# Patient Record
Sex: Male | Born: 1950
Health system: Southern US, Community
[De-identification: ages and names within clinical notes are randomized; demographics above are authoritative.]

## PROBLEM LIST (undated history)

## (undated) DIAGNOSIS — I10 Essential (primary) hypertension: Secondary | ICD-10-CM

## (undated) DIAGNOSIS — E785 Hyperlipidemia, unspecified: Secondary | ICD-10-CM

## (undated) DIAGNOSIS — F0280 Dementia in other diseases classified elsewhere without behavioral disturbance: Secondary | ICD-10-CM

## (undated) DIAGNOSIS — M1990 Unspecified osteoarthritis, unspecified site: Secondary | ICD-10-CM

## (undated) DIAGNOSIS — G1221 Amyotrophic lateral sclerosis: Secondary | ICD-10-CM

## (undated) DIAGNOSIS — I213 ST elevation (STEMI) myocardial infarction of unspecified site: Secondary | ICD-10-CM

## (undated) DIAGNOSIS — K5090 Crohn's disease, unspecified, without complications: Secondary | ICD-10-CM

## (undated) HISTORY — PX: LEG SURGERY: SHX1003

---
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
## 2002-02-10 ENCOUNTER — Emergency Department (HOSPITAL_COMMUNITY): Admission: EM | Admit: 2002-02-10 | Discharge: 2002-02-10 | Payer: Self-pay | Admitting: Emergency Medicine

## 2008-03-23 DIAGNOSIS — I213 ST elevation (STEMI) myocardial infarction of unspecified site: Secondary | ICD-10-CM

## 2008-03-23 HISTORY — DX: ST elevation (STEMI) myocardial infarction of unspecified site: I21.3

## 2016-07-21 DIAGNOSIS — F3341 Major depressive disorder, recurrent, in partial remission: Secondary | ICD-10-CM | POA: Diagnosis not present

## 2016-09-03 DIAGNOSIS — I10 Essential (primary) hypertension: Secondary | ICD-10-CM | POA: Diagnosis not present

## 2016-09-03 DIAGNOSIS — H2513 Age-related nuclear cataract, bilateral: Secondary | ICD-10-CM | POA: Diagnosis not present

## 2016-09-03 DIAGNOSIS — H25013 Cortical age-related cataract, bilateral: Secondary | ICD-10-CM | POA: Diagnosis not present

## 2016-09-03 DIAGNOSIS — H25043 Posterior subcapsular polar age-related cataract, bilateral: Secondary | ICD-10-CM | POA: Diagnosis not present

## 2016-09-03 DIAGNOSIS — H2511 Age-related nuclear cataract, right eye: Secondary | ICD-10-CM | POA: Diagnosis not present

## 2016-10-28 DIAGNOSIS — I252 Old myocardial infarction: Secondary | ICD-10-CM | POA: Diagnosis not present

## 2016-10-28 DIAGNOSIS — F330 Major depressive disorder, recurrent, mild: Secondary | ICD-10-CM | POA: Diagnosis not present

## 2016-10-28 DIAGNOSIS — E785 Hyperlipidemia, unspecified: Secondary | ICD-10-CM | POA: Diagnosis not present

## 2016-10-28 DIAGNOSIS — K5090 Crohn's disease, unspecified, without complications: Secondary | ICD-10-CM | POA: Diagnosis not present

## 2016-10-28 DIAGNOSIS — E669 Obesity, unspecified: Secondary | ICD-10-CM | POA: Diagnosis not present

## 2016-10-28 DIAGNOSIS — I10 Essential (primary) hypertension: Secondary | ICD-10-CM | POA: Diagnosis not present

## 2016-10-28 DIAGNOSIS — G4700 Insomnia, unspecified: Secondary | ICD-10-CM | POA: Diagnosis not present

## 2016-10-28 DIAGNOSIS — F418 Other specified anxiety disorders: Secondary | ICD-10-CM | POA: Diagnosis not present

## 2016-10-28 DIAGNOSIS — G250 Essential tremor: Secondary | ICD-10-CM | POA: Diagnosis not present

## 2016-11-03 DIAGNOSIS — Z131 Encounter for screening for diabetes mellitus: Secondary | ICD-10-CM | POA: Diagnosis not present

## 2016-11-03 DIAGNOSIS — Z6827 Body mass index (BMI) 27.0-27.9, adult: Secondary | ICD-10-CM | POA: Diagnosis not present

## 2016-11-03 DIAGNOSIS — R234 Changes in skin texture: Secondary | ICD-10-CM | POA: Diagnosis not present

## 2016-11-11 DIAGNOSIS — H2511 Age-related nuclear cataract, right eye: Secondary | ICD-10-CM | POA: Diagnosis not present

## 2016-11-11 DIAGNOSIS — I10 Essential (primary) hypertension: Secondary | ICD-10-CM | POA: Diagnosis not present

## 2016-11-11 DIAGNOSIS — Z87891 Personal history of nicotine dependence: Secondary | ICD-10-CM | POA: Diagnosis not present

## 2016-11-11 DIAGNOSIS — H2513 Age-related nuclear cataract, bilateral: Secondary | ICD-10-CM | POA: Diagnosis not present

## 2016-11-11 DIAGNOSIS — H25043 Posterior subcapsular polar age-related cataract, bilateral: Secondary | ICD-10-CM | POA: Diagnosis not present

## 2016-11-11 DIAGNOSIS — I2510 Atherosclerotic heart disease of native coronary artery without angina pectoris: Secondary | ICD-10-CM | POA: Diagnosis not present

## 2016-11-11 DIAGNOSIS — K219 Gastro-esophageal reflux disease without esophagitis: Secondary | ICD-10-CM | POA: Diagnosis not present

## 2016-11-11 DIAGNOSIS — E785 Hyperlipidemia, unspecified: Secondary | ICD-10-CM | POA: Diagnosis not present

## 2016-11-11 DIAGNOSIS — E7800 Pure hypercholesterolemia, unspecified: Secondary | ICD-10-CM | POA: Diagnosis not present

## 2016-11-11 DIAGNOSIS — H25013 Cortical age-related cataract, bilateral: Secondary | ICD-10-CM | POA: Diagnosis not present

## 2016-11-12 DIAGNOSIS — H2512 Age-related nuclear cataract, left eye: Secondary | ICD-10-CM | POA: Diagnosis not present

## 2016-11-24 DIAGNOSIS — F3341 Major depressive disorder, recurrent, in partial remission: Secondary | ICD-10-CM | POA: Diagnosis not present

## 2016-12-02 DIAGNOSIS — Z87891 Personal history of nicotine dependence: Secondary | ICD-10-CM | POA: Diagnosis not present

## 2016-12-02 DIAGNOSIS — K219 Gastro-esophageal reflux disease without esophagitis: Secondary | ICD-10-CM | POA: Diagnosis not present

## 2016-12-02 DIAGNOSIS — Z886 Allergy status to analgesic agent status: Secondary | ICD-10-CM | POA: Diagnosis not present

## 2016-12-02 DIAGNOSIS — K5090 Crohn's disease, unspecified, without complications: Secondary | ICD-10-CM | POA: Diagnosis not present

## 2016-12-02 DIAGNOSIS — Z955 Presence of coronary angioplasty implant and graft: Secondary | ICD-10-CM | POA: Diagnosis not present

## 2016-12-02 DIAGNOSIS — Z8679 Personal history of other diseases of the circulatory system: Secondary | ICD-10-CM | POA: Diagnosis not present

## 2016-12-02 DIAGNOSIS — H2512 Age-related nuclear cataract, left eye: Secondary | ICD-10-CM | POA: Diagnosis not present

## 2016-12-02 DIAGNOSIS — I10 Essential (primary) hypertension: Secondary | ICD-10-CM | POA: Diagnosis not present

## 2016-12-02 DIAGNOSIS — E785 Hyperlipidemia, unspecified: Secondary | ICD-10-CM | POA: Diagnosis not present

## 2016-12-22 DIAGNOSIS — Z87891 Personal history of nicotine dependence: Secondary | ICD-10-CM | POA: Diagnosis not present

## 2016-12-22 DIAGNOSIS — I10 Essential (primary) hypertension: Secondary | ICD-10-CM | POA: Diagnosis not present

## 2016-12-22 DIAGNOSIS — E7800 Pure hypercholesterolemia, unspecified: Secondary | ICD-10-CM | POA: Diagnosis not present

## 2016-12-22 DIAGNOSIS — Z7982 Long term (current) use of aspirin: Secondary | ICD-10-CM | POA: Diagnosis not present

## 2016-12-22 DIAGNOSIS — I2510 Atherosclerotic heart disease of native coronary artery without angina pectoris: Secondary | ICD-10-CM | POA: Diagnosis not present

## 2017-01-07 DIAGNOSIS — R197 Diarrhea, unspecified: Secondary | ICD-10-CM | POA: Diagnosis not present

## 2017-01-07 DIAGNOSIS — K5000 Crohn's disease of small intestine without complications: Secondary | ICD-10-CM | POA: Diagnosis not present

## 2017-01-11 DIAGNOSIS — K5000 Crohn's disease of small intestine without complications: Secondary | ICD-10-CM | POA: Diagnosis not present

## 2017-01-20 DIAGNOSIS — K5000 Crohn's disease of small intestine without complications: Secondary | ICD-10-CM | POA: Diagnosis not present

## 2017-01-20 DIAGNOSIS — R109 Unspecified abdominal pain: Secondary | ICD-10-CM | POA: Diagnosis not present

## 2017-01-21 DIAGNOSIS — R197 Diarrhea, unspecified: Secondary | ICD-10-CM | POA: Diagnosis not present

## 2017-01-21 DIAGNOSIS — K5000 Crohn's disease of small intestine without complications: Secondary | ICD-10-CM | POA: Diagnosis not present

## 2017-02-22 DIAGNOSIS — F3341 Major depressive disorder, recurrent, in partial remission: Secondary | ICD-10-CM | POA: Diagnosis not present

## 2017-02-23 DIAGNOSIS — J40 Bronchitis, not specified as acute or chronic: Secondary | ICD-10-CM | POA: Diagnosis not present

## 2017-02-23 DIAGNOSIS — Z1331 Encounter for screening for depression: Secondary | ICD-10-CM | POA: Diagnosis not present

## 2017-02-23 DIAGNOSIS — Z6827 Body mass index (BMI) 27.0-27.9, adult: Secondary | ICD-10-CM | POA: Diagnosis not present

## 2017-05-21 DIAGNOSIS — G250 Essential tremor: Secondary | ICD-10-CM | POA: Diagnosis not present

## 2017-07-01 DIAGNOSIS — I2510 Atherosclerotic heart disease of native coronary artery without angina pectoris: Secondary | ICD-10-CM | POA: Diagnosis not present

## 2017-07-01 DIAGNOSIS — E782 Mixed hyperlipidemia: Secondary | ICD-10-CM | POA: Diagnosis not present

## 2017-07-01 DIAGNOSIS — Z7982 Long term (current) use of aspirin: Secondary | ICD-10-CM | POA: Diagnosis not present

## 2017-07-01 DIAGNOSIS — Z87891 Personal history of nicotine dependence: Secondary | ICD-10-CM | POA: Diagnosis not present

## 2017-07-01 DIAGNOSIS — I10 Essential (primary) hypertension: Secondary | ICD-10-CM | POA: Diagnosis not present

## 2017-08-13 DIAGNOSIS — I10 Essential (primary) hypertension: Secondary | ICD-10-CM | POA: Diagnosis not present

## 2017-08-13 DIAGNOSIS — Z79899 Other long term (current) drug therapy: Secondary | ICD-10-CM | POA: Diagnosis not present

## 2017-08-13 DIAGNOSIS — Z125 Encounter for screening for malignant neoplasm of prostate: Secondary | ICD-10-CM | POA: Diagnosis not present

## 2017-08-13 DIAGNOSIS — F419 Anxiety disorder, unspecified: Secondary | ICD-10-CM | POA: Diagnosis not present

## 2017-08-13 DIAGNOSIS — Z23 Encounter for immunization: Secondary | ICD-10-CM | POA: Diagnosis not present

## 2017-08-13 DIAGNOSIS — E785 Hyperlipidemia, unspecified: Secondary | ICD-10-CM | POA: Diagnosis not present

## 2017-08-13 DIAGNOSIS — F329 Major depressive disorder, single episode, unspecified: Secondary | ICD-10-CM | POA: Diagnosis not present

## 2017-08-13 DIAGNOSIS — Z1159 Encounter for screening for other viral diseases: Secondary | ICD-10-CM | POA: Diagnosis not present

## 2017-08-13 DIAGNOSIS — M150 Primary generalized (osteo)arthritis: Secondary | ICD-10-CM | POA: Diagnosis not present

## 2017-10-12 DIAGNOSIS — F332 Major depressive disorder, recurrent severe without psychotic features: Secondary | ICD-10-CM | POA: Diagnosis not present

## 2017-12-23 DIAGNOSIS — Z6827 Body mass index (BMI) 27.0-27.9, adult: Secondary | ICD-10-CM | POA: Diagnosis not present

## 2017-12-23 DIAGNOSIS — M79641 Pain in right hand: Secondary | ICD-10-CM | POA: Diagnosis not present

## 2017-12-23 DIAGNOSIS — M150 Primary generalized (osteo)arthritis: Secondary | ICD-10-CM | POA: Diagnosis not present

## 2017-12-23 DIAGNOSIS — S6991XA Unspecified injury of right wrist, hand and finger(s), initial encounter: Secondary | ICD-10-CM | POA: Diagnosis not present

## 2018-01-11 DIAGNOSIS — I10 Essential (primary) hypertension: Secondary | ICD-10-CM | POA: Diagnosis not present

## 2018-01-11 DIAGNOSIS — Z7982 Long term (current) use of aspirin: Secondary | ICD-10-CM | POA: Diagnosis not present

## 2018-01-11 DIAGNOSIS — I2510 Atherosclerotic heart disease of native coronary artery without angina pectoris: Secondary | ICD-10-CM | POA: Diagnosis not present

## 2018-01-11 DIAGNOSIS — E782 Mixed hyperlipidemia: Secondary | ICD-10-CM | POA: Diagnosis not present

## 2018-01-11 DIAGNOSIS — Z87891 Personal history of nicotine dependence: Secondary | ICD-10-CM | POA: Diagnosis not present

## 2018-02-11 DIAGNOSIS — G250 Essential tremor: Secondary | ICD-10-CM | POA: Diagnosis not present

## 2018-02-14 DIAGNOSIS — F332 Major depressive disorder, recurrent severe without psychotic features: Secondary | ICD-10-CM | POA: Diagnosis not present

## 2018-05-09 DIAGNOSIS — E663 Overweight: Secondary | ICD-10-CM | POA: Diagnosis not present

## 2018-05-09 DIAGNOSIS — K5080 Crohn's disease of both small and large intestine without complications: Secondary | ICD-10-CM | POA: Diagnosis not present

## 2018-05-09 DIAGNOSIS — I10 Essential (primary) hypertension: Secondary | ICD-10-CM | POA: Diagnosis not present

## 2018-05-09 DIAGNOSIS — M150 Primary generalized (osteo)arthritis: Secondary | ICD-10-CM | POA: Diagnosis not present

## 2018-05-09 DIAGNOSIS — E785 Hyperlipidemia, unspecified: Secondary | ICD-10-CM | POA: Diagnosis not present

## 2018-05-09 DIAGNOSIS — R251 Tremor, unspecified: Secondary | ICD-10-CM | POA: Diagnosis not present

## 2018-05-09 DIAGNOSIS — Z6827 Body mass index (BMI) 27.0-27.9, adult: Secondary | ICD-10-CM | POA: Diagnosis not present

## 2018-05-09 DIAGNOSIS — F322 Major depressive disorder, single episode, severe without psychotic features: Secondary | ICD-10-CM | POA: Diagnosis not present

## 2018-05-09 DIAGNOSIS — F411 Generalized anxiety disorder: Secondary | ICD-10-CM | POA: Diagnosis not present

## 2018-06-07 DIAGNOSIS — F332 Major depressive disorder, recurrent severe without psychotic features: Secondary | ICD-10-CM | POA: Diagnosis not present

## 2018-07-14 DIAGNOSIS — I10 Essential (primary) hypertension: Secondary | ICD-10-CM | POA: Diagnosis not present

## 2018-07-14 DIAGNOSIS — I2510 Atherosclerotic heart disease of native coronary artery without angina pectoris: Secondary | ICD-10-CM | POA: Diagnosis not present

## 2018-07-14 DIAGNOSIS — Z87891 Personal history of nicotine dependence: Secondary | ICD-10-CM | POA: Diagnosis not present

## 2018-07-14 DIAGNOSIS — E782 Mixed hyperlipidemia: Secondary | ICD-10-CM | POA: Diagnosis not present

## 2018-07-14 DIAGNOSIS — Z7982 Long term (current) use of aspirin: Secondary | ICD-10-CM | POA: Diagnosis not present

## 2018-09-01 DIAGNOSIS — Z125 Encounter for screening for malignant neoplasm of prostate: Secondary | ICD-10-CM | POA: Diagnosis not present

## 2018-09-01 DIAGNOSIS — F411 Generalized anxiety disorder: Secondary | ICD-10-CM | POA: Diagnosis not present

## 2018-09-01 DIAGNOSIS — F322 Major depressive disorder, single episode, severe without psychotic features: Secondary | ICD-10-CM | POA: Diagnosis not present

## 2018-09-01 DIAGNOSIS — Z79899 Other long term (current) drug therapy: Secondary | ICD-10-CM | POA: Diagnosis not present

## 2018-09-01 DIAGNOSIS — R739 Hyperglycemia, unspecified: Secondary | ICD-10-CM | POA: Diagnosis not present

## 2018-09-01 DIAGNOSIS — M8949 Other hypertrophic osteoarthropathy, multiple sites: Secondary | ICD-10-CM | POA: Diagnosis not present

## 2018-09-01 DIAGNOSIS — K5080 Crohn's disease of both small and large intestine without complications: Secondary | ICD-10-CM | POA: Diagnosis not present

## 2018-09-01 DIAGNOSIS — I10 Essential (primary) hypertension: Secondary | ICD-10-CM | POA: Diagnosis not present

## 2018-09-01 DIAGNOSIS — E785 Hyperlipidemia, unspecified: Secondary | ICD-10-CM | POA: Diagnosis not present

## 2018-09-09 DIAGNOSIS — G250 Essential tremor: Secondary | ICD-10-CM | POA: Diagnosis not present

## 2018-09-27 DIAGNOSIS — R112 Nausea with vomiting, unspecified: Secondary | ICD-10-CM | POA: Diagnosis not present

## 2018-09-27 DIAGNOSIS — R42 Dizziness and giddiness: Secondary | ICD-10-CM | POA: Diagnosis not present

## 2018-10-18 DIAGNOSIS — E785 Hyperlipidemia, unspecified: Secondary | ICD-10-CM | POA: Diagnosis not present

## 2018-11-17 ENCOUNTER — Encounter (HOSPITAL_COMMUNITY): Payer: Self-pay | Admitting: Emergency Medicine

## 2018-11-17 ENCOUNTER — Emergency Department (HOSPITAL_COMMUNITY): Payer: Medicare HMO

## 2018-11-17 ENCOUNTER — Inpatient Hospital Stay (HOSPITAL_COMMUNITY)
Admission: EM | Admit: 2018-11-17 | Discharge: 2018-11-19 | DRG: 062 | Disposition: A | Discharge status: Home Health | Payer: Medicare HMO | Source: Other Acute Inpatient Hospital | Attending: Neurology | Admitting: Neurology

## 2018-11-17 DIAGNOSIS — I6381 Other cerebral infarction due to occlusion or stenosis of small artery: Secondary | ICD-10-CM | POA: Diagnosis not present

## 2018-11-17 DIAGNOSIS — K5090 Crohn's disease, unspecified, without complications: Secondary | ICD-10-CM | POA: Diagnosis present

## 2018-11-17 DIAGNOSIS — I252 Old myocardial infarction: Secondary | ICD-10-CM

## 2018-11-17 DIAGNOSIS — I10 Essential (primary) hypertension: Secondary | ICD-10-CM | POA: Diagnosis present

## 2018-11-17 DIAGNOSIS — Z87891 Personal history of nicotine dependence: Secondary | ICD-10-CM

## 2018-11-17 DIAGNOSIS — G1221 Amyotrophic lateral sclerosis: Secondary | ICD-10-CM | POA: Diagnosis not present

## 2018-11-17 DIAGNOSIS — E039 Hypothyroidism, unspecified: Secondary | ICD-10-CM | POA: Diagnosis present

## 2018-11-17 DIAGNOSIS — R29810 Facial weakness: Secondary | ICD-10-CM | POA: Diagnosis not present

## 2018-11-17 DIAGNOSIS — Z20828 Contact with and (suspected) exposure to other viral communicable diseases: Secondary | ICD-10-CM | POA: Diagnosis present

## 2018-11-17 DIAGNOSIS — Z7902 Long term (current) use of antithrombotics/antiplatelets: Secondary | ICD-10-CM

## 2018-11-17 DIAGNOSIS — Z885 Allergy status to narcotic agent status: Secondary | ICD-10-CM | POA: Diagnosis not present

## 2018-11-17 DIAGNOSIS — G8194 Hemiplegia, unspecified affecting left nondominant side: Secondary | ICD-10-CM | POA: Diagnosis not present

## 2018-11-17 DIAGNOSIS — Z79899 Other long term (current) drug therapy: Secondary | ICD-10-CM

## 2018-11-17 DIAGNOSIS — I340 Nonrheumatic mitral (valve) insufficiency: Secondary | ICD-10-CM | POA: Diagnosis not present

## 2018-11-17 DIAGNOSIS — I639 Cerebral infarction, unspecified: Secondary | ICD-10-CM | POA: Diagnosis present

## 2018-11-17 DIAGNOSIS — R531 Weakness: Secondary | ICD-10-CM | POA: Diagnosis not present

## 2018-11-17 DIAGNOSIS — Z79890 Hormone replacement therapy (postmenopausal): Secondary | ICD-10-CM

## 2018-11-17 DIAGNOSIS — R471 Dysarthria and anarthria: Secondary | ICD-10-CM | POA: Diagnosis not present

## 2018-11-17 DIAGNOSIS — R29702 NIHSS score 2: Secondary | ICD-10-CM | POA: Diagnosis present

## 2018-11-17 DIAGNOSIS — I6523 Occlusion and stenosis of bilateral carotid arteries: Secondary | ICD-10-CM | POA: Diagnosis not present

## 2018-11-17 DIAGNOSIS — G309 Alzheimer's disease, unspecified: Secondary | ICD-10-CM | POA: Diagnosis present

## 2018-11-17 DIAGNOSIS — E785 Hyperlipidemia, unspecified: Secondary | ICD-10-CM | POA: Diagnosis present

## 2018-11-17 DIAGNOSIS — R52 Pain, unspecified: Secondary | ICD-10-CM | POA: Diagnosis not present

## 2018-11-17 DIAGNOSIS — F0280 Dementia in other diseases classified elsewhere without behavioral disturbance: Secondary | ICD-10-CM | POA: Diagnosis present

## 2018-11-17 DIAGNOSIS — R0902 Hypoxemia: Secondary | ICD-10-CM | POA: Diagnosis not present

## 2018-11-17 DIAGNOSIS — Z7982 Long term (current) use of aspirin: Secondary | ICD-10-CM

## 2018-11-17 HISTORY — DX: Essential (primary) hypertension: I10

## 2018-11-17 HISTORY — DX: Crohn's disease, unspecified, without complications: K50.90

## 2018-11-17 HISTORY — DX: Unspecified osteoarthritis, unspecified site: M19.90

## 2018-11-17 HISTORY — DX: Amyotrophic lateral sclerosis: G12.21

## 2018-11-17 HISTORY — DX: Dementia in other diseases classified elsewhere, unspecified severity, without behavioral disturbance, psychotic disturbance, mood disturbance, and anxiety: F02.80

## 2018-11-17 HISTORY — DX: Hyperlipidemia, unspecified: E78.5

## 2018-11-17 LAB — I-STAT CHEM 8, ED
BUN: 13 mg/dL (ref 8–23)
Calcium, Ion: 1 mmol/L — ABNORMAL LOW (ref 1.15–1.40)
Chloride: 104 mmol/L (ref 98–111)
Creatinine, Ser: 1.1 mg/dL (ref 0.61–1.24)
Glucose, Bld: 101 mg/dL — ABNORMAL HIGH (ref 70–99)
HCT: 41 % (ref 39.0–52.0)
Hemoglobin: 13.9 g/dL (ref 13.0–17.0)
Potassium: 4 mmol/L (ref 3.5–5.1)
Sodium: 139 mmol/L (ref 135–145)
TCO2: 24 mmol/L (ref 22–32)

## 2018-11-17 LAB — APTT: aPTT: 27 seconds (ref 24–36)

## 2018-11-17 LAB — DIFFERENTIAL
Abs Immature Granulocytes: 0.03 10*3/uL (ref 0.00–0.07)
Basophils Absolute: 0.1 10*3/uL (ref 0.0–0.1)
Basophils Relative: 1 %
Eosinophils Absolute: 0.1 10*3/uL (ref 0.0–0.5)
Eosinophils Relative: 1 %
Immature Granulocytes: 0 %
Lymphocytes Relative: 16 %
Lymphs Abs: 1.3 10*3/uL (ref 0.7–4.0)
Monocytes Absolute: 0.7 10*3/uL (ref 0.1–1.0)
Monocytes Relative: 8 %
Neutro Abs: 6.1 10*3/uL (ref 1.7–7.7)
Neutrophils Relative %: 74 %

## 2018-11-17 LAB — COMPREHENSIVE METABOLIC PANEL
ALT: 23 U/L (ref 0–44)
AST: 27 U/L (ref 15–41)
Albumin: 3.5 g/dL (ref 3.5–5.0)
Alkaline Phosphatase: 97 U/L (ref 38–126)
Anion gap: 11 (ref 5–15)
BUN: 12 mg/dL (ref 8–23)
CO2: 24 mmol/L (ref 22–32)
Calcium: 8.6 mg/dL — ABNORMAL LOW (ref 8.9–10.3)
Chloride: 103 mmol/L (ref 98–111)
Creatinine, Ser: 1.24 mg/dL (ref 0.61–1.24)
GFR calc Af Amer: >60 mL/min (ref >60)
GFR calc non Af Amer: >60 mL/min (ref >60)
Glucose, Bld: 104 mg/dL — ABNORMAL HIGH (ref 70–99)
Potassium: 4.3 mmol/L (ref 3.5–5.1)
Sodium: 138 mmol/L (ref 135–145)
Total Bilirubin: 0.8 mg/dL (ref 0.3–1.2)
Total Protein: 6.4 g/dL — ABNORMAL LOW (ref 6.5–8.1)

## 2018-11-17 LAB — CBC
HCT: 42.9 % (ref 39.0–52.0)
Hemoglobin: 13.7 g/dL (ref 13.0–17.0)
MCH: 30.4 pg (ref 26.0–34.0)
MCHC: 31.9 g/dL (ref 30.0–36.0)
MCV: 95.1 fL (ref 80.0–100.0)
Platelets: 183 10*3/uL (ref 150–400)
RBC: 4.51 MIL/uL (ref 4.22–5.81)
RDW: 14.1 % (ref 11.5–15.5)
WBC: 8.3 10*3/uL (ref 4.0–10.5)
nRBC: 0 % (ref 0.0–0.2)

## 2018-11-17 LAB — PROTIME-INR
INR: 1.1 (ref 0.8–1.2)
Prothrombin Time: 14.3 seconds (ref 11.4–15.2)

## 2018-11-17 LAB — SARS CORONAVIRUS 2 BY RT PCR (HOSPITAL ORDER, PERFORMED IN ~~LOC~~ HOSPITAL LAB): SARS Coronavirus 2: NEGATIVE

## 2018-11-17 LAB — MRSA PCR SCREENING: MRSA by PCR: NEGATIVE

## 2018-11-17 LAB — CBG MONITORING, ED: Glucose-Capillary: 97 mg/dL (ref 70–99)

## 2018-11-17 MED ORDER — ACETAMINOPHEN 325 MG PO TABS: 650.0000 mg | ORAL_TABLET | ORAL | Status: DC | PRN

## 2018-11-17 MED ORDER — ACETAMINOPHEN 160 MG/5ML PO SOLN: 650.0000 mg | ORAL | Status: DC | PRN

## 2018-11-17 MED ORDER — PANTOPRAZOLE SODIUM 40 MG IV SOLR
40.0000 mg | Freq: Every day | INTRAVENOUS | Status: DC
  Administered 2018-11-17: 40 mg via INTRAVENOUS
  Filled 2018-11-17: qty 40

## 2018-11-17 MED ORDER — CHLORHEXIDINE GLUCONATE CLOTH 2 % EX PADS
6.0000 | MEDICATED_PAD | Freq: Every day | CUTANEOUS | Status: DC
  Administered 2018-11-17 – 2018-11-19 (×3): 6 via TOPICAL

## 2018-11-17 MED ORDER — METOPROLOL TARTRATE 50 MG PO TABS
50.0000 mg | ORAL_TABLET | Freq: Two times a day (BID) | ORAL | Status: DC
  Administered 2018-11-17 – 2018-11-19 (×4): 50 mg via ORAL
  Filled 2018-11-17 (×2): qty 2
  Filled 2018-11-17 (×2): qty 1

## 2018-11-17 MED ORDER — STROKE: EARLY STAGES OF RECOVERY BOOK
Freq: Once | Status: AC
  Administered 2018-11-17: 23:00:00
  Filled 2018-11-17: qty 1

## 2018-11-17 MED ORDER — PRIMIDONE 250 MG PO TABS
250.0000 mg | ORAL_TABLET | Freq: Four times a day (QID) | ORAL | Status: DC
  Administered 2018-11-18 – 2018-11-19 (×5): 250 mg via ORAL
  Filled 2018-11-17 (×9): qty 1

## 2018-11-17 MED ORDER — QUETIAPINE FUMARATE 50 MG PO TABS
400.0000 mg | ORAL_TABLET | Freq: Every day | ORAL | Status: DC
  Administered 2018-11-18: 400 mg via ORAL
  Filled 2018-11-17: qty 1

## 2018-11-17 MED ORDER — CLEVIDIPINE BUTYRATE 0.5 MG/ML IV EMUL: 0.0000 mg/h | INTRAVENOUS | Status: DC

## 2018-11-17 MED ORDER — FLUOXETINE HCL 40 MG PO CAPS: 40.0000 mg | ORAL_CAPSULE | Freq: Every day | ORAL | Status: DC

## 2018-11-17 MED ORDER — SODIUM CHLORIDE 0.9 % IV SOLN
INTRAVENOUS | Status: DC
  Administered 2018-11-17: 19:00:00 via INTRAVENOUS

## 2018-11-17 MED ORDER — BUPROPION HCL ER (XL) 150 MG PO TB24: 150.0000 mg | ORAL_TABLET | Freq: Every day | ORAL | Status: DC

## 2018-11-17 MED ORDER — CLONAZEPAM 0.5 MG PO TABS
2.0000 mg | ORAL_TABLET | Freq: Every day | ORAL | Status: DC
  Administered 2018-11-17 – 2018-11-18 (×2): 2 mg via ORAL
  Filled 2018-11-17 (×2): qty 4

## 2018-11-17 MED ORDER — SODIUM CHLORIDE 0.9 % IV SOLN: 50.0000 mL | Freq: Once | INTRAVENOUS | Status: DC

## 2018-11-17 MED ORDER — MESALAMINE ER 250 MG PO CPCR: 500.0000 mg | ORAL_CAPSULE | Freq: Four times a day (QID) | ORAL | Status: DC

## 2018-11-17 MED ORDER — EZETIMIBE 10 MG PO TABS: 10.0000 mg | ORAL_TABLET | Freq: Every day | ORAL | Status: DC

## 2018-11-17 MED ORDER — DOXEPIN HCL 25 MG PO CAPS
25.0000 mg | ORAL_CAPSULE | Freq: Every day | ORAL | Status: DC
  Administered 2018-11-18: 25 mg via ORAL
  Filled 2018-11-17 (×3): qty 1

## 2018-11-17 MED ORDER — SODIUM CHLORIDE 0.9% FLUSH: 3.0000 mL | Freq: Once | INTRAVENOUS | Status: DC

## 2018-11-17 MED ORDER — ALTEPLASE (STROKE) FULL DOSE INFUSION
0.9000 mg/kg | Freq: Once | INTRAVENOUS | Status: AC
  Administered 2018-11-17: 82.1 mg via INTRAVENOUS
  Filled 2018-11-17: qty 100

## 2018-11-17 MED ORDER — ATORVASTATIN CALCIUM 80 MG PO TABS
80.0000 mg | ORAL_TABLET | Freq: Every day | ORAL | Status: DC
  Administered 2018-11-18 – 2018-11-19 (×2): 80 mg via ORAL
  Filled 2018-11-17 (×3): qty 1

## 2018-11-17 MED ORDER — SENNOSIDES-DOCUSATE SODIUM 8.6-50 MG PO TABS: 1.0000 | ORAL_TABLET | Freq: Every evening | ORAL | Status: DC | PRN

## 2018-11-17 MED ORDER — LABETALOL HCL 5 MG/ML IV SOLN: 20.0000 mg | Freq: Once | INTRAVENOUS | Status: DC

## 2018-11-17 MED ORDER — SODIUM CHLORIDE 0.9 % IV SOLN
50.0000 mL | Freq: Once | INTRAVENOUS | Status: AC
  Administered 2018-11-17: 50 mL via INTRAVENOUS

## 2018-11-17 MED ORDER — BUPROPION HCL ER (XL) 150 MG PO TB24
150.0000 mg | ORAL_TABLET | Freq: Every day | ORAL | Status: DC
  Filled 2018-11-17: qty 1

## 2018-11-17 MED ORDER — ALTEPLASE (STROKE) FULL DOSE INFUSION
0.9000 mg/kg | Freq: Once | INTRAVENOUS | Status: DC
  Filled 2018-11-17: qty 100

## 2018-11-17 MED ORDER — ACETAMINOPHEN 650 MG RE SUPP: 650.0000 mg | RECTAL | Status: DC | PRN

## 2018-11-17 MED ORDER — LISINOPRIL 5 MG PO TABS
5.0000 mg | ORAL_TABLET | Freq: Every day | ORAL | Status: DC
  Administered 2018-11-17 – 2018-11-19 (×3): 5 mg via ORAL
  Filled 2018-11-17 (×3): qty 1

## 2018-11-17 NOTE — H&P (Signed)
Admission H&P    Chief Complaint: Acute onset of left sided weakness  HPI: Derek Vargas is an 68 y.o. male presenting to the ED via EMS with acute onset of left sided weakness. Dysarthria and left facial droop were also noted by EMS. However, he is edentulous, which likely contributes to the dysarthria and no facial droop was seen by the examining Neurology team. He has a history of MI and is on ASA and Plavix. He has no prior history of stroke. No contraindications to tPA after comprehensive review with the patient.   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 Also without tongue fasciculations.  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 He is on medications for his HTN, HLD and Crohn's disease.   LSN: [id number] tPA Given: Yes   Past Medical History:  Diagnosis Date  . Alzheimer's disease ([place])    BORDERLINE per wife  . Arthritis   . Crohn disease ([place])   . Hyperlipidemia   . Hypertension   . [name] disease [facility])   . STEMI (ST elevation myocardial infarction) ([place]) 2010    Past Surgical History:  Procedure Laterality Date  . LEG SURGERY Right    1990s?    No family history on file. Social History:  reports that he has quit smoking. His smoking use included cigarettes. He has never used smokeless tobacco. He reports that he does not drink alcohol or use drugs.  Allergies:  Allergies  Allergen Reactions  . Codeine Nausea And Vomiting     No current facility-administered medications on file prior to encounter.    Current Outpatient Medications on File Prior to Encounter  Medication Sig Dispense Refill  . atorvastatin (LIPITOR) 80 MG tablet Take 80 mg by mouth daily.    . clonazePAM (KLONOPIN) 2 MG tablet Take 2 mg by mouth at bedtime.    Marland Kitchen levothyroxine (SYNTHROID) 25 MCG tablet Take 25 mcg by mouth daily before breakfast.    . lisinopril (ZESTRIL) 5 MG  tablet Take 5 mg by mouth daily.    . metoprolol tartrate (LOPRESSOR) 50 MG tablet Take 50 mg by mouth 2 (two) times daily.    . traMADol-acetaminophen (ULTRACET) 37.5-325 MG tablet Take 1 tablet by mouth every 6 (six) hours as needed.    Marland Kitchen FLUoxetine (PROZAC) 40 MG capsule Take 40 mg by mouth daily.    . primidone (MYSOLINE) 250 MG tablet Take 250 mg by mouth at bedtime.        ROS: Left sided weakness and speech deficit. Denies other symptoms.   Physical Examination: Blood pressure (!) 107/59, pulse 69, temperature (!) 97.5 F (36.4 C), resp. rate 15, weight 91.2 kg, SpO2 96 %.  HEENT-  [place]/AT  Cardiovascular - RRR Lungs - Normal breath sounds. Respirations unlabored.  Abdomen - NT/ND Extremities - No edema  Neurologic Examination: Mental Status: Alert, fully oriented, thought content appropriate. Significant dysarthria with his being edentulous explaining some if not all of the slurred quality.  Speech fluent with intact comprehension and naming. However, he has some difficulty with repetition.  Cranial Nerves: II:  Visual fields intact with no extinction to DSS. PERRL.  III,IV, VI: EOMI. No nystagmus or ptosis.  V,VII: Edentulous smile is symmetric. Facial temp sensation equal bilaterally.  IX,X: Palate rises symmetrically XI: Symmetric XII: midline tongue extension  Motor: RUE and RLE 5/5 LUE 4/5 LLE 4+/5  No atrophy noted Sensory: Temp and light touch intact throughout, bilaterally. No extinction to DSS.  Deep Tendon Reflexes:  1-2+ bilateral upper and lower extremities except 0 achilles.  Toes downgoing bilaterally  Cerebellar: No ataxia with FNF on the right. Dysmetric with attempted FNF on the left.  Gait: Deferred   Results for orders placed or performed during the hospital encounter of 11/17/18 (from the past 48 hour(s))  Protime-INR     Status: None   Collection Time: 11/17/18 12:59 PM  Result Value Ref Range   Prothrombin Time 14.3 11.4 - 15.2 seconds   INR  1.1 0.8 - 1.2    Comment: (NOTE) INR goal varies based on device and disease states. Performed at [facility], [place] [address], [place], [place] [address]   APTT     Status: None   Collection Time: 11/17/18 12:59 PM  Result Value Ref Range   aPTT 27 24 - 36 seconds    Comment: Performed at [facility] [address], [place] [address]  CBC     Status: None   Collection Time: 11/17/18 12:59 PM  Result Value Ref Range   WBC 8.3 4.0 - 10.5 K/uL   RBC 4.51 4.22 - 5.81 MIL/uL   Hemoglobin 13.7 13.0 - 17.0 g/dL   HCT 42.9 39.0 - 52.0 %   MCV 95.1 80.0 - 100.0 fL   MCH 30.4 26.0 - 34.0 pg   MCHC 31.9 30.0 - 36.0 g/dL   RDW 14.1 11.5 - 15.5 %   Platelets 183 150 - 400 K/uL   nRBC 0.0 0.0 - 0.2 %    Comment: Performed at [facility], [place] [address], [place], [place] [address]  Differential     Status: None   Collection Time: 11/17/18 12:59 PM  Result Value Ref Range   Neutrophils Relative % 74 %   Neutro Abs 6.1 1.7 - 7.7 K/uL   Lymphocytes Relative 16 %   Lymphs Abs 1.3 0.7 - 4.0 K/uL   Monocytes Relative 8 %   Monocytes Absolute 0.7 0.1 - 1.0 K/uL   Eosinophils Relative 1 %   Eosinophils Absolute 0.1 0.0 - 0.5 K/uL   Basophils Relative 1 %   Basophils Absolute 0.1 0.0 - 0.1 K/uL   Immature Granulocytes 0 %   Abs Immature Granulocytes 0.03 0.00 - 0.07 K/uL    Comment: Performed at [facility] [address], [place], [place] [address]  Comprehensive metabolic panel     Status: Abnormal   Collection Time: 11/17/18 12:59 PM  Result Value Ref Range   Sodium 138 135 - 145 mmol/L   Potassium 4.3 3.5 - 5.1 mmol/L   Chloride 103 98 - 111 mmol/L   CO2 24 22 - 32 mmol/L   Glucose, Bld 104 (H) 70 - 99 mg/dL   BUN 12 8 - 23 mg/dL   Creatinine, Ser 1.24 0.61 - 1.24 mg/dL   Calcium 8.6 (L) 8.9 - 10.3 mg/dL   Total Protein 6.4 (L) 6.5 - 8.1 g/dL   Albumin 3.5 3.5 - 5.0 g/dL   AST 27 15 - 41 U/L   ALT 23 0 - 44 U/L   Alkaline Phosphatase 97 38 -  126 U/L   Total Bilirubin 0.8 0.3 - 1.2 mg/dL   GFR calc non Af Amer >60 >60 mL/min   GFR calc Af Amer >60 >60 mL/min   Anion gap 11 5 - 15  Comment: Performed at [facility], [place] [address] [place], [place] [address]  I-stat chem 8, ED     Status: Abnormal   Collection Time: 11/17/18  1:06 PM  Result Value Ref Range   Sodium 139 135 - 145 mmol/L   Potassium 4.0 3.5 - 5.1 mmol/L   Chloride 104 98 - 111 mmol/L   BUN 13 8 - 23 mg/dL   Creatinine, Ser 1.10 0.61 - 1.24 mg/dL   Glucose, Bld 101 (H) 70 - 99 mg/dL   Calcium, Ion 1.00 (L) 1.15 - 1.40 mmol/L   TCO2 24 22 - 32 mmol/L   Hemoglobin 13.9 13.0 - 17.0 g/dL   HCT 41.0 39.0 - 52.0 %  CBG monitoring, ED     Status: None   Collection Time: 11/17/18  2:00 PM  Result Value Ref Range   Glucose-Capillary 97 70 - 99 mg/dL   Ct Head Code Stroke Wo Contrast  Result Date: 11/17/2018 CLINICAL DATA:  Code stroke. 68 year old male last seen normal at 0 900 hours. Facial droop. EXAM: CT HEAD WITHOUT CONTRAST TECHNIQUE: Contiguous axial images were obtained from the base of the skull through the vertex without intravenous contrast. COMPARISON:  [facility] 10/10/2014. FINDINGS: Brain: No midline shift, mass effect, or evidence of intracranial mass lesion. No acute intracranial hemorrhage identified. No ventriculomegaly. Mild generalized cerebral volume loss since 2016. No cortically based acute infarct identified. Gray-white matter differentiation appears stable and within normal limits. No cortical encephalomalacia identified. Vascular: Mild Calcified atherosclerosis at the skull base. No suspicious intracranial vascular hyperdensity. Skull: No acute osseous abnormality identified. Sinuses/Orbits: New [facility] obstructive pattern left paranasal sinus opacification. Right paranasal sinuses, tympanic cavities and mastoids remain well pneumatized. Other: No gaze deviation. No acute orbit or scalp soft tissue finding. Interval  postoperative changes to both globes. ASPECTS [facility] Stroke Program Early CT Score) Total score (0-10 with 10 being normal): 10 IMPRESSION: 1. Stable since 2016 and negative for age noncontrast CT appearance of the brain. ASPECTS 10. 2. Left side paranasal sinus disease is new. 3. These results were communicated to Dr. Cheral Marker at 1:18 pmon 8/27/2020by text page via the [facility] messaging system. Electronically Signed   By: Genevie Ann M.D.   On: 11/17/2018 13:19    Assessment: 68 y.o. male presenting with acute onset of left sided weakness  1. Exam findings best localize as a right basal ganglia or internal capsule lacunar infarction. Also could correspond to a small deep white matter ischemic infarction.  2. Stroke Risk Factors - HTN, HLD, prior MI 3. 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 Also without tongue fasciculations. 4. Possible incipient Alzheimer disease.  5. CT head shows no hemorrhage or acute hypodensity  Recommendations: 1. After comprehensive review of possible contraindications, he has no absolute contraindications to tPA administration. Patient is a tPA candidate. Discussed extensively the risks/benefits of tPA treatment vs. no treatment with the patient, including risks of hemorrhage and death with tPA administration versus worse overall outcomes on average in patients within tPA time window who are not administered tPA. Overall benefits of tPA regarding long-term prognosis are felt to outweigh risks. The patient expressed understanding and wish to proceed with tPA. Consent was witnessed by Elizebeth Brooking, RN.  2. Admitting to Neuro ICU after tPA administration. Post-tPA order set to include frequent neuro checks and BP management.  3. No antiplatelet  medications or anticoagulants for at least 24 hours following tPA.  4. DVT prophylaxis with SCDs.  5. Continue atorvastatin.  6. If  repeat CT head is negative for hemorrhage at 24 hours, start ASA 81 mg po qd.  7. Carotid ultrasound 8. TTE.  9. MRI brain, MRA head.  10. PT/OT/Speech.  11. NPO until passes swallow evaluation.  12. Fasting lipid panel, HgbA1c  13. Telemetry monitoring  60 minutes spent in the emergent neurological evaluation and management of this critically ill patient.  Electronically signed: Dr. Kerney Elbe 11/17/2018, 2:47 PM

## 2018-11-17 NOTE — ED Provider Notes (Signed)
[facility] EMERGENCY DEPARTMENT Provider Note   CSN: [id number] Arrival date & time: 11/17/18  1258  An emergency department physician performed an initial assessment on this suspected stroke patient at 1259([place]).  History   Chief Complaint Chief Complaint  Patient presents with   Code Stroke    HPI Derek Vargas is a 68 y.o. male.     HPI Patient came in as a code stroke.  At around 9:00 this morning developed some difficulty walking.  Later developed some difficulty speaking.  No headache.  No confusion.  Met by neurology and myself upon patient's arrival. No past medical history on file.  There are no active problems to display for this patient.        Home Medications    Prior to Admission medications   Not on File    Family History No family history on file.  Social History Social History   Tobacco Use   Smoking status: Not on file  Substance Use Topics   Alcohol use: Not on file   Drug use: Not on file     Allergies   Patient has no allergy information on record.   Review of Systems Review of Systems  Constitutional: Negative for appetite change.  HENT: Negative for congestion.   Respiratory: Negative for shortness of breath.   Gastrointestinal: Negative for abdominal pain.  Genitourinary: Negative for flank pain.  Musculoskeletal: Negative for gait problem.  Skin: Negative for rash.  Neurological: Positive for speech difficulty and weakness.  Psychiatric/Behavioral: Negative for confusion.     Physical Exam Updated Vital Signs BP 110/67    Pulse 66    Temp 97.6 F (36.4 C)    Resp 17    Wt 91.2 kg    SpO2 95%   Physical Exam Vitals signs and nursing note reviewed.  Constitutional:      Appearance: Normal appearance.  HENT:     Head: Normocephalic.  Eyes:     Extraocular Movements: Extraocular movements intact.     Pupils: Pupils are equal, round, and reactive to light.  Cardiovascular:     Rate  and Rhythm: Normal rate and regular rhythm.  Pulmonary:     Breath sounds: No wheezing, rhonchi or rales.  Abdominal:     Tenderness: There is no abdominal tenderness.  Neurological:     Comments: Strength symmetric in bilateral lower extremities.  Left upper extremity has mild drift with no drift on the right.  Face symmetric.  Does have some rather severely slurred speech however.  Eye movements intact.  Visual fields grossly intact.  Complete NIH scoring done by neurology.      ED Treatments / Results  Labs (all labs ordered are listed, but only abnormal results are displayed) Labs Reviewed  COMPREHENSIVE METABOLIC PANEL - Abnormal; Notable for the following components:      Result Value   Glucose, Bld 104 (*)    Calcium 8.6 (*)    Total Protein 6.4 (*)    All other components within normal limits  I-STAT CHEM 8, ED - Abnormal; Notable for the following components:   Glucose, Bld 101 (*)    Calcium, Ion 1.00 (*)    All other components within normal limits  SARS CORONAVIRUS 2 (HOSPITAL ORDER, [facility])  PROTIME-INR  APTT  CBC  DIFFERENTIAL  CBG MONITORING, ED    EKG None  Radiology Ct Head Code Stroke Wo Contrast  Result Date: 11/17/2018 CLINICAL DATA:  Code  stroke. 68 year old male last seen normal at 0 900 hours. Facial droop. EXAM: CT HEAD WITHOUT CONTRAST TECHNIQUE: Contiguous axial images were obtained from the base of the skull through the vertex without intravenous contrast. COMPARISON:  [facility] 10/10/2014. FINDINGS: Brain: No midline shift, mass effect, or evidence of intracranial mass lesion. No acute intracranial hemorrhage identified. No ventriculomegaly. Mild generalized cerebral volume loss since 2016. No cortically based acute infarct identified. Gray-white matter differentiation appears stable and within normal limits. No cortical encephalomalacia identified. Vascular: Mild Calcified atherosclerosis at the skull  base. No suspicious intracranial vascular hyperdensity. Skull: No acute osseous abnormality identified. Sinuses/Orbits: New [facility] obstructive pattern left paranasal sinus opacification. Right paranasal sinuses, tympanic cavities and mastoids remain well pneumatized. Other: No gaze deviation. No acute orbit or scalp soft tissue finding. Interval postoperative changes to both globes. ASPECTS [facility] Stroke Program Early CT Score) Total score (0-10 with 10 being normal): 10 IMPRESSION: 1. Stable since 2016 and negative for age noncontrast CT appearance of the brain. ASPECTS 10. 2. Left side paranasal sinus disease is new. 3. These results were communicated to Dr. Cheral Marker at 1:18 pmon 8/27/2020by text page via the [facility] messaging system. Electronically Signed   By: Genevie Ann M.D.   On: 11/17/2018 13:19    Procedures Procedures (including critical care time)  Medications Ordered in ED Medications  sodium chloride flush (NS) 0.9 % injection 3 mL (has no administration in time range)  alteplase (ACTIVASE) 1 mg/mL infusion 82.1 mg (82.1 mg Intravenous New Bag/Given 11/17/18 1323)    Followed by  0.9 %  sodium chloride infusion (has no administration in time range)     Initial Impression / Assessment and Plan / ED Course  I have reviewed the triage vital signs and the nursing notes.  Pertinent labs & imaging results that were available during my care of the patient were reviewed by me and considered in my medical decision making (see chart for details).        Patient with potential stroke.  Dysarthria.  Left upper extremity drift.  Seen by neurology.  Given TPA in the ER.  Admit to neurology.  Final Clinical Impressions(s) / ED Diagnoses   Final diagnoses:  Cerebrovascular accident (CVA), unspecified mechanism [facility])    ED Discharge Orders    None       Davonna Belling, MD 11/17/18 1415

## 2018-11-17 NOTE — Progress Notes (Signed)
PHARMACIST CODE STROKE RESPONSE  Notified to mix tPA at 1317 by Dr. Cheral Marker Delivered tPA to RN at 1322  tPA dose = 8.2 mg bolus over 1 minute followed by 73.9 mg for a total dose of 82.1 mg over 1 hour  Issues/delays encountered (if applicable): None  Derek Vargas Z  [name], 11/17/18 1:30 PM

## 2018-11-17 NOTE — ED Triage Notes (Signed)
Patient arrives via [facility] from home as Code Stroke - LKW 0900 this morning. Per wife, patient drove just after 9am to get them biscuits and when he came back, he was sleepy (which wife reports is not abnormal for him during the day sometimes) and felt "generally weak." Upon waking about 1215, patient noticed that he couldn't ambulate and his speech was slurred. Wife also states that he was drooling a little - not exhibited in ED. Upon initial assessment in ED, speech is slurred and L arm drift noted. 4+/5 strength noted bilaterally to legs, but no drift. A&O x 4 on arrival, follows commands and answers questions appropriately.

## 2018-11-17 NOTE — ED Notes (Signed)
Patient's wife at bedside.

## 2018-11-17 NOTE — ED Notes (Signed)
Patient adds that he took his Tramadol (hx arthritis) this morning, which may be why he is sleepy.

## 2018-11-17 NOTE — Code Documentation (Signed)
68yo male arriving to [facility] via [place] at [phone]. Patient from home where he was LKW at 0900. His wife noticed he was having difficulty ambulating and called EMS. EMS assessed difficulty speaking and activated a code stroke. Stroke team at the bedside on patient arrival. Labs drawn and patient cleared for CT by Dr. Regenia Skeeter. Patient to CT with team. CT completed. NIHSS 2, see documentation for details and code stroke times. Patient with dysarthria and LUE drift on exam. BP within tPA parameters. Decision made to treat with tPA. 8.2mg  tPA bolus given at 1323 over 1 minute followed by 73.9mg /hr for a total of 82.1mg  per pharmacy dosing. Patient transported to the ED. Patient monitored frequently per tPA protocol. Patient to be admitted to ICU. Bedside handoff with ED RN Jerene Pitch.

## 2018-11-17 NOTE — ED Notes (Signed)
ED TO INPATIENT HANDOFF REPORT  ED Nurse Name and Phone #: Daralene Milch, RN  S Name/Age/Gender Derek Vargas 68 y.o. male Room/Bed: RESUSC/RESUSC  Code Status   Code Status: Full Code  Home/SNF/Other Home Patient oriented to: self, place, time and situation Is this baseline? Yes   Triage Complete: Triage complete  Chief Complaint Code Stroke  Triage Note Patient arrives via [facility] from home as Code Stroke - LKW 0900 this morning. Per wife, patient drove just after 9am to get them biscuits and when he came back, he was sleepy (which wife reports is not abnormal for him during the day sometimes) and felt "generally weak." Upon waking about 1215, patient noticed that he couldn't ambulate and his speech was slurred. Wife also states that he was drooling a little - not exhibited in ED. Upon initial assessment in ED, speech is slurred and L arm drift noted. 4+/5 strength noted bilaterally to legs, but no drift. A&O x 4 on arrival, follows commands and answers questions appropriately.      Allergies Allergies  Allergen Reactions  . Codeine Nausea And Vomiting    Level of Care/Admitting Diagnosis ED Disposition    ED Disposition Condition [facility]: [place] [[id number]]  Level of Care: ICU [6]  Covid Evaluation: Asymptomatic Screening Protocol (No Symptoms)  [facility]: [facility]brum[id number]  Admitting Physician: Kerney Elbe [phone]  Attending Physician: Cheral Marker, ERIC [email or web address]  Estimated length of stay: 5 - 7 days  Certification:: I certify this patient will need inpatient services for at least 2 midnights  PT Class (Do Not Modify): Inpatient [101]  PT Acc Code (Do Not Modify): Private [1]       B Medical/Surgery History Past Medical History:  Diagnosis Date  . Alzheimer's disease ([place])    BORDERLINE per wife  . Arthritis   . Crohn disease ([place])   . Hyperlipidemia   . Hypertension   . 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 STEMI (ST elevation myocardial infarction) ([place]) 2010   Past Surgical History:  Procedure Laterality Date  . LEG SURGERY Right    1990s?     A IV Location/Drains/Wounds Patient Lines/Drains/Airways Status   Active Line/Drains/Airways    Name:   Placement date:   Placement time:   Site:   Days:   Peripheral IV 11/17/18 Right Antecubital   11/17/18    1300    Antecubital   less than 1   Peripheral IV 11/17/18 Right Forearm   11/17/18    1315    Forearm   less than 1          Intake/Output Last 24 hours  Intake/Output Summary (Last 24 hours) at 11/17/2018 1748 Last data filed at 11/17/2018 1454 Gross per 24 hour  Intake 132.6 ml  Output 500 ml  Net -367.4 ml    Labs/Imaging Results for orders placed or performed during the hospital encounter of 11/17/18 (from the past 48 hour(s))  Protime-INR     Status: None   Collection Time: 11/17/18 12:59 PM  Result Value Ref Range   Prothrombin Time 14.3 11.4 - 15.2 seconds   INR 1.1 0.8 - 1.2    Comment: (NOTE) INR goal varies based on device and disease states. Performed at [facility], [place] [address] [place], [place] [address]   APTT     Status: None   Collection Time: 11/17/18 12:59 PM  Result Value Ref Range   aPTT  27 24 - 36 seconds    Comment: Performed at [facility], [place] [address], [place] [address]  CBC     Status: None   Collection Time: 11/17/18 12:59 PM  Result Value Ref Range   WBC 8.3 4.0 - 10.5 K/uL   RBC 4.51 4.22 - 5.81 MIL/uL   Hemoglobin 13.7 13.0 - 17.0 g/dL   HCT 42.9 39.0 - 52.0 %   MCV 95.1 80.0 - 100.0 fL   MCH 30.4 26.0 - 34.0 pg   MCHC 31.9 30.0 - 36.0 g/dL   RDW 14.1 11.5 - 15.5 %   Platelets 183 150 - 400 K/uL   nRBC 0.0 0.0 - 0.2 %    Comment: Performed at [facility], [place] [address] [place], [place] [address]  Differential     Status: None   Collection Time: 11/17/18 12:59 PM  Result Value Ref Range   Neutrophils Relative % 74 %   Neutro Abs 6.1 1.7 -  7.7 K/uL   Lymphocytes Relative 16 %   Lymphs Abs 1.3 0.7 - 4.0 K/uL   Monocytes Relative 8 %   Monocytes Absolute 0.7 0.1 - 1.0 K/uL   Eosinophils Relative 1 %   Eosinophils Absolute 0.1 0.0 - 0.5 K/uL   Basophils Relative 1 %   Basophils Absolute 0.1 0.0 - 0.1 K/uL   Immature Granulocytes 0 %   Abs Immature Granulocytes 0.03 0.00 - 0.07 K/uL    Comment: Performed at [facility] [address] [place], [place] [address]  Comprehensive metabolic panel     Status: Abnormal   Collection Time: 11/17/18 12:59 PM  Result Value Ref Range   Sodium 138 135 - 145 mmol/L   Potassium 4.3 3.5 - 5.1 mmol/L   Chloride 103 98 - 111 mmol/L   CO2 24 22 - 32 mmol/L   Glucose, Bld 104 (H) 70 - 99 mg/dL   BUN 12 8 - 23 mg/dL   Creatinine, Ser 1.24 0.61 - 1.24 mg/dL   Calcium 8.6 (L) 8.9 - 10.3 mg/dL   Total Protein 6.4 (L) 6.5 - 8.1 g/dL   Albumin 3.5 3.5 - 5.0 g/dL   AST 27 15 - 41 U/L   ALT 23 0 - 44 U/L   Alkaline Phosphatase 97 38 - 126 U/L   Total Bilirubin 0.8 0.3 - 1.2 mg/dL   GFR calc non Af Amer >60 >60 mL/min   GFR calc Af Amer >60 >60 mL/min   Anion gap 11 5 - 15    Comment: Performed at [facility] [address] [place], [place] [address]  I-stat chem 8, ED     Status: Abnormal   Collection Time: 11/17/18  1:06 PM  Result Value Ref Range   Sodium 139 135 - 145 mmol/L   Potassium 4.0 3.5 - 5.1 mmol/L   Chloride 104 98 - 111 mmol/L   BUN 13 8 - 23 mg/dL   Creatinine, Ser 1.10 0.61 - 1.24 mg/dL   Glucose, Bld 101 (H) 70 - 99 mg/dL   Calcium, Ion 1.00 (L) 1.15 - 1.40 mmol/L   TCO2 24 22 - 32 mmol/L   Hemoglobin 13.9 13.0 - 17.0 g/dL   HCT 41.0 39.0 - 52.0 %  SARS Coronavirus 2 [facility] order, Performed in [facility] hospital lab) Nasopharyngeal Nasopharyngeal Swab     Status: None   Collection Time: 11/17/18  1:48 PM   Specimen: Nasopharyngeal Swab  Result  Value Ref Range   SARS Coronavirus 2 NEGATIVE NEGATIVE    Comment: (NOTE) If result is  NEGATIVE SARS-CoV-2 target nucleic acids are NOT DETECTED. The SARS-CoV-2 RNA is generally detectable in upper and lower  respiratory specimens during the acute phase of infection. The lowest  concentration of SARS-CoV-2 viral copies this assay can detect is 250  copies / mL. A negative result does not preclude SARS-CoV-2 infection  and should not be used as the sole basis for treatment or other  patient management decisions.  A negative result may occur with  improper specimen collection / handling, submission of specimen other  than nasopharyngeal swab, presence of viral mutation(s) within the  areas targeted by this assay, and inadequate number of viral copies  (<250 copies / mL). A negative result must be combined with clinical  observations, patient history, and epidemiological information. If result is POSITIVE SARS-CoV-2 target nucleic acids are DETECTED. The SARS-CoV-2 RNA is generally detectable in upper and lower  respiratory specimens dur ing the acute phase of infection.  Positive  results are indicative of active infection with SARS-CoV-2.  Clinical  correlation with patient history and other diagnostic information is  necessary to determine patient infection status.  Positive results do  not rule out bacterial infection or co-infection with other viruses. If result is PRESUMPTIVE POSTIVE SARS-CoV-2 nucleic acids MAY BE PRESENT.   A presumptive positive result was obtained on the submitted specimen  and confirmed on repeat testing.  While 2019 novel coronavirus  (SARS-CoV-2) nucleic acids may be present in the submitted sample  additional confirmatory testing may be necessary for epidemiological  and / or clinical management purposes  to differentiate between  SARS-CoV-2 and other Sarbecovirus currently known to infect humans.  If clinically indicated additional testing with an alternate test  methodology [phone]) is advised. The SARS-CoV-2 RNA is generally  detectable  in upper and lower respiratory sp ecimens during the acute  phase of infection. The expected result is Negative. Fact Sheet for Patients:  StrictlyIdeas.no Fact Sheet for Healthcare Providers: BankingDealers.co.za This test is not yet approved or cleared by the [place] FDA and has been authorized for detection and/or diagnosis of SARS-CoV-2 by FDA under an Emergency Use Authorization (EUA).  This EUA will remain in effect (meaning this test can be used) for the duration of the COVID-19 declaration under Section 564(b)(1) of the Act, 21 U.S.C. section 360bbb-3(b)(1), unless the authorization is terminated or revoked sooner. Performed at [facility], [place] [address], [place], [place] [address]   CBG monitoring, ED     Status: None   Collection Time: 11/17/18  2:00 PM  Result Value Ref Range   Glucose-Capillary 97 70 - 99 mg/dL   Ct Head Code Stroke Wo Contrast  Result Date: 11/17/2018 CLINICAL DATA:  Code stroke. 68 year old male last seen normal at 0 900 hours. Facial droop. EXAM: CT HEAD WITHOUT CONTRAST TECHNIQUE: Contiguous axial images were obtained from the base of the skull through the vertex without intravenous contrast. COMPARISON:  [facility] 10/10/2014. FINDINGS: Brain: No midline shift, mass effect, or evidence of intracranial mass lesion. No acute intracranial hemorrhage identified. No ventriculomegaly. Mild generalized cerebral volume loss since 2016. No cortically based acute infarct identified. Gray-white matter differentiation appears stable and within normal limits. No cortical encephalomalacia identified. Vascular: Mild Calcified atherosclerosis at the skull base. No suspicious intracranial vascular hyperdensity. Skull: No acute osseous abnormality identified. Sinuses/Orbits: New [facility] obstructive pattern left paranasal sinus opacification. Right  paranasal sinuses, tympanic cavities and mastoids remain  well pneumatized. Other: No gaze deviation. No acute orbit or scalp soft tissue finding. Interval postoperative changes to both globes. ASPECTS [facility] Stroke Program Early CT Score) Total score (0-10 with 10 being normal): 10 IMPRESSION: 1. Stable since 2016 and negative for age noncontrast CT appearance of the brain. ASPECTS 10. 2. Left side paranasal sinus disease is new. 3. These results were communicated to Dr. Cheral Marker at 1:18 pmon 8/27/2020by text page via the [facility] messaging system. Electronically Signed   By: Genevie Ann M.D.   On: 11/17/2018 13:19    Pending Labs Unresulted Labs (From admission, onward)    Start     Ordered   11/18/18 0500  Hemoglobin A1c  Tomorrow morning,   R     11/17/18 1517   11/18/18 0500  Lipid panel  Tomorrow morning,   R    Comments: Fasting    11/17/18 1517   11/17/18 1511  HIV antibody (Routine Testing)  Once,   STAT     11/17/18 1517          Vitals/Pain Today's Vitals   11/17/18 1701 11/17/18 1715 11/17/18 1730 11/17/18 1747  BP: 123/71 125/65 123/71 (!) 142/69  Pulse: 68 64 64 67  Resp: 16 18 18 16   Temp: (!) 97.5 F (36.4 C)   (!) 97.3 F (36.3 C)  TempSrc:      SpO2: 99% 96% 98% 96%  Weight:      PainSc:        Isolation Precautions No active isolations  Medications Medications  sodium chloride flush (NS) 0.9 % injection 3 mL (has no administration in time range)   stroke: mapping our early stages of recovery book (has no administration in time range)  0.9 %  sodium chloride infusion (has no administration in time range)  acetaminophen (TYLENOL) tablet 650 mg (has no administration in time range)    Or  acetaminophen (TYLENOL) solution 650 mg (has no administration in time range)    Or  acetaminophen (TYLENOL) suppository 650 mg (has no administration in time range)  senna-docusate (Senokot-S) tablet 1 tablet (has no administration in time range)  pantoprazole (PROTONIX) injection 40 mg (has no administration in time range)   labetalol (NORMODYNE) injection 20 mg (has no administration in time range)    And  clevidipine (CLEVIPREX) infusion 0.5 mg/mL (has no administration in time range)  atorvastatin (LIPITOR) tablet 80 mg (has no administration in time range)  buPROPion (WELLBUTRIN XL) 24 hr tablet 150 mg (has no administration in time range)  clonazePAM (KLONOPIN) tablet 2 mg (has no administration in time range)  doxepin (SINEQUAN) capsule 25 mg (has no administration in time range)  ezetimibe (ZETIA) tablet 10 mg (has no administration in time range)  FLUoxetine (PROZAC) capsule 40 mg (has no administration in time range)  lisinopril (ZESTRIL) tablet 5 mg (has no administration in time range)  mesalamine (PENTASA) CR capsule 500 mg (has no administration in time range)  metoprolol tartrate (LOPRESSOR) tablet 50 mg (has no administration in time range)  primidone (MYSOLINE) tablet 250 mg (has no administration in time range)  QUEtiapine (SEROQUEL) tablet 400 mg (has no administration in time range)  alteplase (ACTIVASE) 1 mg/mL infusion 82.1 mg (0 mg/kg  91.2 kg Intravenous Stopped 11/17/18 1423)    Followed by  0.9 %  sodium chloride infusion (0 mLs Intravenous Stopped 11/17/18 1454)    Mobility walks     Focused Assessments Neuro Assessment Handoff:  Swallow screen pass? No  Cardiac Rhythm: Normal sinus rhythm NIH Stroke Scale ( + Modified Stroke Scale Criteria)  Interval: (30 min post tpa reassessments) Level of Consciousness (1a.)   : Alert, keenly responsive LOC Questions (1b. )   +: Answers both questions correctly LOC Commands (1c. )   + : Performs both tasks correctly Best Gaze (2. )  +: Normal Visual (3. )  +: No visual loss Facial Palsy (4. )    : Minor paralysis Motor Arm, Left (5a. )   +: No drift Motor Arm, Right (5b. )   +: No drift Motor Leg, Left (6a. )   +: No drift Motor Leg, Right (6b. )   +: No drift Limb Ataxia (7. ): Absent Sensory (8. )   +: Normal, no sensory  loss Best Language (9. )   +: No aphasia Dysarthria (10. ): Normal Extinction/Inattention (11.)   +: No Abnormality Modified SS Total  +: 0 Complete NIHSS TOTAL: 1 Last date known well: 11/17/18 Last time known well: 0900 Neuro Assessment: (S) Exceptions to WDL(patient continues to have slurred speech and states he is 68yo, however upon most recent 23min reassessment, all 4 extremities appear stronger, no drift noted and grips/felxion/dorsiflexion is stronger bilaterally) Neuro Checks:   Initial (11/17/18 1300)  Last Documented NIHSS Modified Score: 0 (11/17/18 1747) Has TPA been given? No If patient is a Neuro Trauma and patient is going to OR before floor call report to [place] nurse: [phone] or [phone]     R Recommendations: See Admitting Provider Note  Report given to:   Additional Notes:  Pt is alert and oriented x's 4.  Pt did not pass stroke swallow screen due to getting choked

## 2018-11-17 NOTE — ED Notes (Signed)
HELP GET PATIENT UNDRESS ON THE MONITOR DID EKG SHOWN TO THE PROVIDER PATIENT IS RESTING WITH NURSES AT BEDSIDE

## 2018-11-18 ENCOUNTER — Inpatient Hospital Stay (HOSPITAL_COMMUNITY): Payer: Medicare HMO

## 2018-11-18 DIAGNOSIS — I340 Nonrheumatic mitral (valve) insufficiency: Secondary | ICD-10-CM

## 2018-11-18 DIAGNOSIS — I10 Essential (primary) hypertension: Secondary | ICD-10-CM

## 2018-11-18 DIAGNOSIS — E785 Hyperlipidemia, unspecified: Secondary | ICD-10-CM

## 2018-11-18 LAB — LIPID PANEL
Cholesterol: 137 mg/dL (ref 0–200)
HDL: 41 mg/dL (ref >40)
LDL Cholesterol: 79 mg/dL (ref 0–99)
Total CHOL/HDL Ratio: 3.3 RATIO
Triglycerides: 86 mg/dL (ref <150)
VLDL: 17 mg/dL (ref 0–40)

## 2018-11-18 LAB — ECHOCARDIOGRAM COMPLETE: Weight: 3216.95 oz

## 2018-11-18 LAB — GLUCOSE, CAPILLARY: Glucose-Capillary: 111 mg/dL — ABNORMAL HIGH (ref 70–99)

## 2018-11-18 LAB — HEMOGLOBIN A1C
Hgb A1c MFr Bld: 5.6 % (ref 4.8–5.6)
Mean Plasma Glucose: 114.02 mg/dL

## 2018-11-18 LAB — HIV ANTIBODY (ROUTINE TESTING W REFLEX): HIV Screen 4th Generation wRfx: NONREACTIVE

## 2018-11-18 MED ORDER — IOHEXOL 350 MG/ML SOLN
100.0000 mL | Freq: Once | INTRAVENOUS | Status: AC | PRN
  Administered 2018-11-18: 100 mL via INTRAVENOUS

## 2018-11-18 MED ORDER — LABETALOL HCL 5 MG/ML IV SOLN: 5.0000 mg | INTRAVENOUS | Status: DC | PRN

## 2018-11-18 MED ORDER — PANTOPRAZOLE SODIUM 40 MG PO TBEC
40.0000 mg | DELAYED_RELEASE_TABLET | Freq: Every day | ORAL | Status: DC
  Administered 2018-11-19: 40 mg via ORAL
  Filled 2018-11-18: qty 1

## 2018-11-18 MED ORDER — CLOPIDOGREL BISULFATE 75 MG PO TABS
75.0000 mg | ORAL_TABLET | Freq: Every day | ORAL | Status: DC
  Administered 2018-11-18 – 2018-11-19 (×2): 75 mg via ORAL
  Filled 2018-11-18 (×2): qty 1

## 2018-11-18 MED ORDER — ASPIRIN EC 81 MG PO TBEC
81.0000 mg | DELAYED_RELEASE_TABLET | Freq: Every day | ORAL | Status: DC
  Administered 2018-11-18 – 2018-11-19 (×2): 81 mg via ORAL
  Filled 2018-11-18 (×2): qty 1

## 2018-11-18 NOTE — Progress Notes (Signed)
Echocardiogram 2D Echocardiogram has been performed.  Derek Vargas 11/18/2018, 11:44 AM

## 2018-11-18 NOTE — Progress Notes (Signed)
Rehab Admissions Coordinator Note:  Per PT recommendation, this patient was screened by Jhonnie Garner for appropriateness for an Inpatient Acute Rehab Consult.  Noted pt is already supervision for transfers on evaluation and wants to go home.  Anticipate that once insurance has had time to process the case, the patient will be too high level to qualify for IP Rehab. Will not pursue CIR for this patient.   Jhonnie Garner 11/18/2018, 2:16 PM  I can be reached at [phone]

## 2018-11-18 NOTE — Evaluation (Signed)
Speech Language Pathology Evaluation Patient Details Name: Derek Vargas MRN: [id number] DOB: Oct 19, 1950 Today's Date: 11/18/2018 Time: [id number] SLP Time Calculation (min) (ACUTE ONLY): 18 min  Problem List:  Patient Active Problem List   Diagnosis Date Noted  . 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 Alzheimer's disease ([place])    BORDERLINE per wife  . Arthritis   . Crohn disease ([place])   . Hyperlipidemia   . Hypertension   . 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 STEMI (ST elevation myocardial infarction) ([place]) 2010   Past Surgical History:  Past Surgical History:  Procedure Laterality Date  . LEG SURGERY Right    1990s?   HPI:  Pt is a 68 y.o. male who presented to the ED via EMS with acute onset of left sided weakness, dysarthria and left facial droop. PMH is significant for [name] disease, HTN, HLD, Crohn's disease and "borderline" Alzheimer's per wife. TPA was administered. CT of the head was negative for acute changes.    Assessment / Plan / Recommendation Clinical Impression  Pt reported that he was living with his wife prior to admission and that she assists him with medication management. He stated that he has had difficulty with short-term memory since 2004 when he "turned over" a cement truck. With the pt's permission his wife was contacted via phone and she indicated that the pt's doctor has told her that the pt is "on the verge of having Alzheimer's since his brain is shrinking". She cited cognitive deficits related to problem solving, attention, and memory.   Pt's cognitive-linguistic skills appear to be at baseline at this time and his language skills were within normal limits. He presented with mild to moderate dysarthria characterized by reduced articulatory precision which negatively impacted speech intelligibility at the conversational level. Skilled SLP services are clinically indicated at this time to  improve dysarthria. Pt, his wife, and nursing were educated regarding results and recommendations; all parties verbalized understanding as well as agreement with plan of care.    SLP Assessment  SLP Recommendation/Assessment: Patient needs continued Speech Lanaguage Pathology Services SLP Visit Diagnosis: Dysarthria and anarthria (R47.1)    Follow Up Recommendations  (Continued SLP services at level of care recommended by PT/OT)    Frequency and Duration min 2x/week  2 weeks      SLP Evaluation Cognition  Overall Cognitive Status: History of cognitive impairments - at baseline Arousal/Alertness: Awake/alert Orientation Level: Oriented X4 Attention: Focused;Sustained Focused Attention: Appears intact Sustained Attention: Impaired Sustained Attention Impairment: Verbal complex Memory: Impaired Memory Impairment: Storage deficit;Retrieval deficit;Decreased recall of new information(Immediate: 2/3; delayed: 1/3; with cues: 0/2) Awareness: Impaired Awareness Impairment: Emergent impairment Problem Solving: Impaired Problem Solving Impairment: Verbal complex       Comprehension  Auditory Comprehension Overall Auditory Comprehension: Appears within functional limits for tasks assessed Yes/No Questions: Impaired Basic Biographical Questions: (5/5) Complex Questions: (4/5) Paragraph Comprehension (via yes/no questions): (2/4) Commands: Impaired Two Step Basic Commands: (4/4) Multistep Basic Commands: (0/3) Conversation: Simple Interfering Components: Attention;Processing speed;Working memory EffectiveTechniques: Extra processing time;Repetition;Increased volume Visual Recognition/Discrimination Discrimination: Within Function Limits Reading Comprehension Reading Status: Not tested    Expression Verbal Expression Overall Verbal Expression: Appears within functional limits for tasks assessed Initiation: No impairment Automatic Speech: Counting;Day of week;Month of  year(WNL) Level of Generative/Spontaneous Verbalization: Conversation Repetition: Impaired Level of Impairment: Sentence level(1/4) Naming: No impairment Pragmatics: No impairment Interfering Components: Attention;Speech intelligibility   Oral / Motor  Oral Motor/Sensory Function Overall Oral Motor/Sensory Function: Within functional limits Motor Speech Overall Motor Speech: Impaired Respiration: Within functional limits Phonation: Normal Resonance: Within functional limits Articulation: Impaired Level of Impairment: Sentence Intelligibility: Intelligibility reduced Word: 75-100% accurate Phrase: 75-100% accurate Sentence: 75-100% accurate Conversation: 75-100% accurate Motor Planning: Witnin functional limits Motor Speech Errors: Aware;Consistent   Mon[place]ath Berhe I. Hardin Negus, [place], [place] Office number [phone] Pager [phone]                    Horton Marshall 11/18/2018, 10:11 AM

## 2018-11-18 NOTE — Progress Notes (Signed)
STROKE TEAM PROGRESS NOTE   INTERVAL HISTORY Pt sitting in chair for breakfast. He stated that his weakness and numbness have been resolved. He has no deficit as per pt and he is asking for going home. Pending MRI, CTA head and neck.   Vitals:   11/18/18 0800 11/18/18 0808 11/18/18 0900 11/18/18 1000  BP: (!) 145/62  (!) 118/54 (!) 118/54  Pulse: (!) 57  (!) 51   Resp: 14  (!) 23   Temp:  97.9 F (36.6 C)    TempSrc:  Oral    SpO2: 97%  (!) 89%   Weight:        CBC:  Recent Labs  Lab 11/17/18 1259 11/17/18 1306  WBC 8.3  --   NEUTROABS 6.1  --   HGB 13.7 13.9  HCT 42.9 41.0  MCV 95.1  --   PLT 183  --     Basic Metabolic Panel:  Recent Labs  Lab 11/17/18 1259 11/17/18 1306  NA 138 139  K 4.3 4.0  CL 103 104  CO2 24  --   GLUCOSE 104* 101*  BUN 12 13  CREATININE 1.24 1.10  CALCIUM 8.6*  --    Lipid Panel:     Component Value Date/Time   CHOL 137 11/18/2018 0208   TRIG 86 11/18/2018 0208   HDL 41 11/18/2018 0208   CHOLHDL 3.3 11/18/2018 0208   VLDL 17 11/18/2018 0208   LDLCALC 79 11/18/2018 0208   HgbA1c:  Lab Results  Component Value Date   HGBA1C 5.6 11/18/2018   Urine Drug Screen: No results found for: LABOPIA, COCAINSCRNUR, LABBENZ, AMPHETMU, THCU, LABBARB  Alcohol Level No results found for: [place] Head Code Stroke Wo Contrast 11/17/2018 1. Stable since 2016 and negative for age noncontrast CT appearance of the brain. ASPECTS 10. 2. Left side paranasal sinus disease is new.    PHYSICAL EXAM  Temp:  [97.3 F (36.3 C)-98.1 F (36.7 C)] 97.9 F (36.6 C) (08/28 0808) Pulse Rate:  [51-78] 51 (08/28 0900) Resp:  [11-23] 23 (08/28 0900) BP: (98-175)/(54-91) 118/54 (08/28 1000) SpO2:  [89 %-100 %] 89 % (08/28 0900) Weight:  [91.2 kg-93.9 kg] 91.2 kg (08/27 1305)  General - Well nourished, well developed, in no apparent distress.  Ophthalmologic - fundi not visualized due to noncooperation.  Cardiovascular - Regular rate and  rhythm.  Mental Status -  Level of arousal and orientation to time, place, and person were intact. Language including expression, naming, repetition, comprehension was assessed and found intact. Mild dysarthria Fund of Knowledge was assessed and was intact.  Cranial Nerves II - XII - II - Visual field intact OU. III, IV, VI - Extraocular movements intact. V - Facial sensation intact bilaterally. VII - Facial movement intact bilaterally. VIII - Hearing & vestibular intact bilaterally. X - Palate elevates symmetrically, poor dentition and mild dysarthria. XI - Chin turning & shoulder shrug intact bilaterally. XII - Tongue protrusion intact.  Motor Strength - The patient's strength was normal in all extremities and pronator drift was absent.  Bulk was normal and fasciculations were absent.   Motor Tone - Muscle tone was assessed at the neck and appendages and was normal.  Reflexes - The patient's reflexes were symmetrical in all extremities and he had no pathological reflexes.  Sensory - Light touch, temperature/pinprick were assessed and were symmetrical.    Coordination - The patient had normal movements in the hands and feet with no ataxia or dysmetria.  Tremor was absent.  Gait and Station - deferred.   ASSESSMENT/PLAN Mr. Lo Roston is a 68 y.o. male with history of HTN, HLD, Crohn's, MI on DAPT, ALS, and "borderline" Alzheimer's  presenting with L sided weakness and dysarthria (edentulous). Received tPA 11/17/2018 at 1323.  Stroke vs. TIA s/p tPA - MRI pending  Code Stroke CT head No acute abnormality.   CTA head & neck pending    MRI  pending  2D Echo pending  LDL 79  HgbA1c 5.6  UDS pending   SCDs for VTE prophylaxis  aspirin 81 mg daily and clopidogrel 75 mg daily prior to admission, now on No antithrombotic as within 24h of tPA administration. Plan resume aspirin if 24h imaging neg for hemorrhage.    Therapy recommendations:  SLP    Disposition:   pending (lives with wife PTA)  Hypertension  Home meds:  Lisinopril 5, moetprolol 50 bid  Stable BP goal < 180/105 post tPA administration . Long-term BP goal normotensive  Hyperlipidemia  Home meds:  lipitor 80, resumed in hospital  LDL 79, goal < 70  Continue statin at discharge  Other Stroke Risk Factors  Advanced age  Former Cigarette smoker  Hx of STEMI  Other Active Problems  ?? ALS  "borderline" Alzheimer's   PPI added  Hypothyroid on synthroid   Crohn's disease  Hospital day # 1  This patient is critically ill due to stroke vs. TIA s/p tPA and at significant risk of neurological worsening, death form recurrent stroke, hemorrhagic conversion. This patient's care requires constant monitoring of vital signs, hemodynamics, respiratory and cardiac monitoring, review of multiple databases, neurological assessment, discussion with family, other specialists and medical decision making of high complexity. I spent 35 minutes of neurocritical care time in the care of this patient.  Rosalin Hawking, MD PhD Stroke Neurology 11/18/2018 11:46 AM    To contact Stroke Continuity provider, please refer to [email or web address] After hours, contact General Neurology

## 2018-11-18 NOTE — Evaluation (Signed)
Physical Therapy Evaluation Patient Details Name: Derek Vargas MRN: [id number] DOB: 11-07-1950 Today's Date: 11/18/2018   History of Present Illness  Pt is a 68 y.o. M with significant PMH of Lou Gehrig disease, HTN, Crohn's disease, and "borderline" Alzheimer's per wife. Presents with acute onset of light sided weakness, dysarthria and left facial droop. TPA administered. CT negative for acute changes. MRI showing mild brain volume loss, left paranasal sinus obstruction inflammation.  Clinical Impression  Pt admitted with above. PT passing by room and saw pt standing in room, unassisted, tangled up in his IV/telemetry lines and attempting to get to the bathroom. PT assisted pt safely to and from the bathroom. On PT evaluation, pt presents with decreased functional mobility secondary to dynamic balance impairments and decreased cognition. Ambulating room distances with no assistive device at a min assist level for stability. Prior to admission, pt lives with his wife and is independent with mobility and ADL's. Per discussion with pt wife, pt seemingly at cognitive baseline. Currently recommending CIR to address deficits and maximize functional independence. Pt presents as high fall risk based on balance deficits and decreased safety awareness.     Follow Up Recommendations CIR;Supervision/Assistance - 24 hour    Equipment Recommendations  None recommended by PT    Recommendations for Other Services       Precautions / Restrictions Precautions Precautions: Fall Restrictions Weight Bearing Restrictions: No      Mobility  Bed Mobility               General bed mobility comments: Out of bed upon entry  Transfers Overall transfer level: Needs assistance Equipment used: None Transfers: Sit to/from Stand Sit to Stand: Supervision         General transfer comment: supervision for safety  Ambulation/Gait Ambulation/Gait assistance: Min assist Gait Distance (Feet): 20  Feet Assistive device: None Gait Pattern/deviations: Step-through pattern;Scissoring     General Gait Details: Pt with dynamic unsteadiness, requiring up to min assist. x2 episodes of lateral LOB   Stairs            Wheelchair Mobility    Modified Rankin (Stroke Patients Only) Modified Rankin (Stroke Patients Only) Pre-Morbid Rankin Score: Slight disability Modified Rankin: Moderately severe disability     Balance Overall balance assessment: Needs assistance   Sitting balance-Leahy Scale: Good     Standing balance support: No upper extremity supported;During functional activity Standing balance-Leahy Scale: Fair                               Pertinent Vitals/Pain Pain Assessment: No/denies pain    Home Living Family/patient expects to be discharged to:: Private residence Living Arrangements: Spouse/significant other Available Help at Discharge: Family;Available 24 hours/day Type of Home: Apartment Home Access: Level entry     Home Layout: One level        Prior Function Level of Independence: Independent         Comments: Pt wife assists with medication management     Hand Dominance        Extremity/Trunk Assessment   Upper Extremity Assessment Upper Extremity Assessment: RUE deficits/detail;LUE deficits/detail RUE Deficits / Details: Strength 5/5 LUE Deficits / Details: Strength 5/5    Lower Extremity Assessment Lower Extremity Assessment: RLE deficits/detail;LLE deficits/detail RLE Deficits / Details: Strength 5/5 LLE Deficits / Details: Strength 5/5       Communication   Communication: No difficulties  Cognition Arousal/Alertness: Awake/alert Behavior During  Therapy: WFL for tasks assessed/performed Overall Cognitive Status: History of cognitive impairments - at baseline                                 General Comments: Pt wife reports pt is close to baseline. Pt demonstrates decreased awareness of  deficits/safety, short term memory. Upon entry, pt standing up in room without assist (managed to get out of bed with all 4 bed rails up), tangled up in IV and telemetry lines. After education about not getting up without assist, pt then attempting to get off toilet and recliner later in session multiple times very impulsively       General Comments      Exercises     Assessment/Plan    PT Assessment Patient needs continued PT services  PT Problem List Decreased balance;Decreased mobility;Decreased cognition;Decreased safety awareness       PT Treatment Interventions Gait training;Stair training;Functional mobility training;Therapeutic activities;Therapeutic exercise;Balance training;Patient/family education    PT Goals (Current goals can be found in the Care Plan section)  Acute Rehab PT Goals Patient Stated Goal: "go home." PT Goal Formulation: With patient Time For Goal Achievement: 12/02/18 Potential to Achieve Goals: Good    Frequency Min 4X/week   Barriers to discharge        Co-evaluation               AM-PAC PT "6 Clicks" Mobility  Outcome Measure Help needed turning from your back to your side while in a flat bed without using bedrails?: None Help needed moving from lying on your back to sitting on the side of a flat bed without using bedrails?: None Help needed moving to and from a bed to a chair (including a wheelchair)?: None Help needed standing up from a chair using your arms (e.g., wheelchair or bedside chair)?: None Help needed to walk in hospital room?: A Little Help needed climbing 3-5 steps with a railing? : A Little 6 Click Score: 22    End of Session Equipment Utilized During Treatment: Gait belt Activity Tolerance: Patient tolerated treatment well Patient left: in chair;with call bell/phone within reach;with chair alarm set Nurse Communication: Mobility status PT Visit Diagnosis: Unsteadiness on feet (R26.81)    Time: [id number] PT Time  Calculation (min) (ACUTE ONLY): 20 min   Charges:   PT Evaluation $PT Eval Moderate Complexity: [place], PT, DPT Acute Rehabilitation Services Pager [phone] Office [phone]   Willy Eddy 11/18/2018, 1:49 PM

## 2018-11-18 NOTE — Progress Notes (Signed)
  Speech Language Pathology Treatment: Cognitive-Linquistic(Dysarthria)  Patient Details Name: Derek Vargas MRN: [id number] DOB: [birth year] Today's Date: 11/18/2018 Time: [id number] SLP Time Calculation (min) (ACUTE ONLY): 15 min  Assessment / Plan / Recommendation Clinical Impression  Pt was seen for dysarthria treatment and was cooperative during the session. He was educated regarding the nature of dysarthria, and compensatory strategies to improve speech intelligibility. Pt verbalized understanding regarding all areas of education but exhibited difficulty recalling compensatory strategies and simultaneously implementing more than one strategy. For this reason, the session focused only on vocal intensity. He used this strategy at the phrase level with 70% accuracy increasing to 100% accuracy with min-mod cues. At the sentence level he demonstrated 50% accuracy increasing to 100% with mod cues. Pt occasionally reduced speaking rate during conversation when clarification was requested by the SLP. SLP will continue to follow pt.     HPI HPI: Pt is a 68 y.o. male who presented to the ED via EMS with acute onset of left sided weakness, dysarthria and left facial droop. PMH is significant for [name] disease, HTN, HLD, Crohn's disease and "borderline" Alzheimer's per wife. TPA was administered. CT of the head was negative for acute changes.       SLP Plan  Continue with current plan of care  Patient needs continued Speech Lanaguage Pathology Services    Recommendations                   Follow up Recommendations: (Continued SLP services at level of care recommended by PT/OT) SLP Visit Diagnosis: Dysarthria and anarthria (R47.1) Plan: Continue with current plan of care       Elan Mcelvain I. Hardin Negus, [place], [place] Office number [phone] Pager [phone]                Horton Marshall 11/18/2018, 10:18 AM

## 2018-11-19 ENCOUNTER — Other Ambulatory Visit: Payer: Self-pay

## 2018-11-19 MED ORDER — PANTOPRAZOLE SODIUM 40 MG PO TBEC: 40.0000 mg | DELAYED_RELEASE_TABLET | Freq: Every day | ORAL | 1 refills | Status: DC

## 2018-11-19 MED ORDER — CLOPIDOGREL BISULFATE 75 MG PO TABS: 75.0000 mg | ORAL_TABLET | Freq: Every day | ORAL | 3 refills | Status: AC

## 2018-11-19 MED ORDER — PRIMIDONE 250 MG PO TABS: 250.0000 mg | ORAL_TABLET | Freq: Four times a day (QID) | ORAL | 11 refills | Status: AC

## 2018-11-19 NOTE — Discharge Summary (Signed)
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 Alzheimer's disease ([place])    BORDERLINE per wife  . Arthritis   . Crohn disease ([place])   . Hyperlipidemia   . Hypertension   . 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 STEMI (ST elevation myocardial infarction) ([place]) 2010   Past Surgical History:  Procedure Laterality Date  . LEG SURGERY Right    1990s?    Family History No family history on file.  Social History  reports that he has quit smoking. His smoking use included cigarettes. He has never used smokeless tobacco. He reports that he does not drink alcohol or use drugs.  Allergies as of 11/19/2018      Reactions   Codeine Nausea And Vomiting      Medication List    TAKE these medications   aspirin EC 81 MG tablet Take 81 mg by mouth daily.   atorvastatin 80 MG tablet Commonly known as: LIPITOR Take 80 mg by mouth daily.   buPROPion 150 MG 24 hr tablet Commonly known as: WELLBUTRIN XL Take 150 mg by mouth daily.   clonazePAM 2 MG tablet Commonly known as: KLONOPIN Take 2 mg by mouth at bedtime.   clopidogrel 75 MG tablet Commonly known as: PLAVIX Take 1 tablet (75 mg total) by mouth daily. Start taking on: November 20, 2018   doxepin 100 MG capsule Commonly known as: SINEQUAN Take 100 mg by mouth at bedtime.   FLUoxetine 40 MG capsule Commonly known as: PROZAC Take 40 mg by mouth daily.   Kaopectate 262 MG Tabs Generic drug: Bismuth Subsalicylate Take 1 tablet by mouth as needed (upset stomach).   levothyroxine 25 MCG tablet Commonly known as: SYNTHROID Take 25 mcg by mouth daily before breakfast.    lisinopril 5 MG tablet Commonly known as: ZESTRIL Take 5 mg by mouth daily.   meclizine 25 MG tablet Commonly known as: ANTIVERT Take 25 mg by mouth as needed for dizziness.   Melatonin 10 MG Tabs Take 10 mg by mouth at bedtime.   metoprolol tartrate 50 MG tablet Commonly known as: LOPRESSOR Take 50 mg by mouth 2 (two) times daily.   pantoprazole 40 MG tablet Commonly known as: PROTONIX Take 1 tablet (40 mg total) by mouth daily. Start taking on: November 20, 2018   primidone 250 MG tablet Commonly known as: MYSOLINE Take 1 tablet (250 mg total) by mouth 4 (four) times daily. What changed:   how much to take  when to take this   promethazine 25 MG tablet Commonly known as: PHENERGAN Take 25 mg by mouth as needed for nausea or vomiting.   QUEtiapine 300 MG tablet Commonly known as: SEROQUEL Take 750 mg by mouth at bedtime.   traMADol-acetaminophen 37.5-325 MG tablet Commonly known as: ULTRACET Take 1 tablet by mouth 2 (two) times daily.       HOME MEDICATIONS PRIOR TO ADMISSION Medications Prior to Admission  Medication Sig Dispense Refill  . aspirin EC 81 MG tablet Take 81 mg by mouth daily.    Marland Kitchen atorvastatin (LIPITOR) 80 MG  tablet Take 80 mg by mouth daily.    . Bismuth Subsalicylate (KAOPECTATE) 262 MG TABS Take 1 tablet by mouth as needed (upset stomach).    Marland Kitchen buPROPion (WELLBUTRIN XL) 150 MG 24 hr tablet Take 150 mg by mouth daily.    . clonazePAM (KLONOPIN) 2 MG tablet Take 2 mg by mouth at bedtime.    Marland Kitchen doxepin (SINEQUAN) 100 MG capsule Take 100 mg by mouth at bedtime.    Marland Kitchen FLUoxetine (PROZAC) 40 MG capsule Take 40 mg by mouth daily.    Marland Kitchen levothyroxine (SYNTHROID) 25 MCG tablet Take 25 mcg by mouth daily before breakfast.    . lisinopril (ZESTRIL) 5 MG tablet Take 5 mg by mouth daily.    . meclizine (ANTIVERT) 25 MG tablet Take 25 mg by mouth as needed for dizziness.     . Melatonin 10 MG TABS Take 10 mg by mouth at bedtime.    . metoprolol tartrate  (LOPRESSOR) 50 MG tablet Take 50 mg by mouth 2 (two) times daily.    . primidone (MYSOLINE) 250 MG tablet Take 750 mg by mouth at bedtime.     . promethazine (PHENERGAN) 25 MG tablet Take 25 mg by mouth as needed for nausea or vomiting.     Marland Kitchen QUEtiapine (SEROQUEL) 300 MG tablet Take 750 mg by mouth at bedtime.    . traMADol-acetaminophen (ULTRACET) 37.5-325 MG tablet Take 1 tablet by mouth 2 (two) times daily.        HOSPITAL MEDICATIONS . aspirin EC  81 mg Oral Daily  . atorvastatin  80 mg Oral Daily  . buPROPion  150 mg Oral Daily  . Chlorhexidine Gluconate Cloth  6 each Topical Daily  . clonazePAM  2 mg Oral QHS  . clopidogrel  75 mg Oral Daily  . doxepin  25 mg Oral QHS  . lisinopril  5 mg Oral Daily  . metoprolol tartrate  50 mg Oral BID  . pantoprazole  40 mg Oral Daily  . primidone  250 mg Oral QID  . QUEtiapine  400 mg Oral QHS  . sodium chloride flush  3 mL Intravenous Once    LABORATORY STUDIES CBC    Component Value Date/Time   WBC 8.3 11/17/2018 1259   RBC 4.51 11/17/2018 1259   HGB 13.9 11/17/2018 1306   HCT 41.0 11/17/2018 1306   PLT 183 11/17/2018 1259   MCV 95.1 11/17/2018 1259   MCH 30.4 11/17/2018 1259   MCHC 31.9 11/17/2018 1259   RDW 14.1 11/17/2018 1259   LYMPHSABS 1.3 11/17/2018 1259   MONOABS 0.7 11/17/2018 1259   EOSABS 0.1 11/17/2018 1259   BASOSABS 0.1 11/17/2018 1259   CMP    Component Value Date/Time   NA 139 11/17/2018 1306   K 4.0 11/17/2018 1306   CL 104 11/17/2018 1306   CO2 24 11/17/2018 1259   GLUCOSE 101 (H) 11/17/2018 1306   BUN 13 11/17/2018 1306   CREATININE 1.10 11/17/2018 1306   CALCIUM 8.6 (L) 11/17/2018 1259   PROT 6.4 (L) 11/17/2018 1259   ALBUMIN 3.5 11/17/2018 1259   AST 27 11/17/2018 1259   ALT 23 11/17/2018 1259   ALKPHOS 97 11/17/2018 1259   BILITOT 0.8 11/17/2018 1259   GFRNONAA >60 11/17/2018 1259   GFRAA >60 11/17/2018 1259   COAGS Lab Results  Component Value Date   INR 1.1 11/17/2018   Lipid  Panel    Component Value Date/Time   CHOL 137 11/18/2018 0208   TRIG 86  11/18/2018 0208   HDL 41 11/18/2018 0208   CHOLHDL 3.3 11/18/2018 0208   VLDL 17 11/18/2018 0208   LDLCALC 79 11/18/2018 0208   HgbA1C  Lab Results  Component Value Date   HGBA1C 5.6 11/18/2018   Urinalysis No results found for: COLORURINE, APPEARANCEUR, LABSPEC, PHURINE, GLUCOSEU, HGBUR, BILIRUBINUR, KETONESUR, PROTEINUR, UROBILINOGEN, NITRITE, LEUKOCYTESUR Urine Drug Screen No results found for: LABOPIA, COCAINSCRNUR, LABBENZ, AMPHETMU, THCU, LABBARB  Alcohol Level No results found for: [facility]   SIGNIFICANT DIAGNOSTIC STUDIES  Ct Angio Head W Or Wo Contrast Ct Angio Neck W Or Wo Contrast 11/18/2018 IMPRESSION:  Mild atherosclerosis in the head and neck without large vessel occlusion or significant proximal stenosis.   Mr Brain Wo Contrast 11/18/2018 IMPRESSION:  Mild brain volume loss. Cerebral hemispheres show atrophy evidence of old small or large vessel infarction. Old or acute small or large vessel focal infarction. Left paranasal sinus obstruction inflammation.   Ct Head Code Stroke Wo Contrast 11/17/2018 IMPRESSION:  1. Stable since 2016 and negative for age noncontrast CT appearance of the brain. ASPECTS 10.  2. Left side paranasal sinus disease is new.   Transthoracic Echocardiogram  IMPRESSIONS  1. The left ventricle has normal systolic function with an ejection fraction of 60-65%. The cavity size was normal. Left ventricular diastolic parameters were normal. No evidence of left ventricular regional wall motion abnormalities.  2. The right ventricle has normal systolic function. The cavity was mildly enlarged. There is no increase in right ventricular wall thickness.  3. The mitral valve is abnormal. Mild thickening of the mitral valve leaflet. There is mild mitral annular calcification present.  4. The tricuspid valve is grossly normal.  5. The aortic valve is tricuspid. Mild sclerosis of the  aortic valve. Aortic valve regurgitation is trivial by color flow Doppler. No stenosis of the aortic valve.  6. The aorta is normal unless otherwise noted.  7. The interatrial septum was not well visualized.  HISTORY OF PRESENT ILLNESS (From admission H&P by Dr Cheral Marker on 11/17/2018) Derek Vargas is an 68 y.o. male presenting to the ED via EMS with acute onset of left sided weakness. Dysarthria and left facial droop were also noted by EMS. However, he is edentulous, which likely contributes to the dysarthria and no facial droop was seen by the examining Neurology team. He has a history of MI and is on ASA and Plavix. He has no prior history of stroke. No contraindications to tPA after comprehensive review with the patient.  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 Also without tongue fasciculations. 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 He is on medications for his HTN, HLD and Crohn's disease.   LSN: [id number] tPA Given: Yes  HOSPITAL COURSE Derek Vargas is a 68 y.o. male with history of HTN, HLD, Crohn's, MI on DAPT, ALS, and "borderline" Alzheimer's  presenting with L sided weakness and dysarthria (edentulous).  Received tPA 11/17/2018 at 1323.  Stroke vs. TIA s/p tPA - MRI pending  Code Stroke CT head - Stable since 2016 and negative for age noncontrast CT appearance of the brain. ASPECTS 10.   CTA head & neck - Mild atherosclerosis in the head and neck without large vessel occlusion or significant proximal stenosis.   MRI - Mild brain volume loss. Cerebral hemispheres show atrophy evidence of old small or large vessel infarction. Old or acute small  or large vessel focal infarction. Left paranasal sinus obstruction inflammation.   2D Echo - EF 60 - 65%. No cardiac source of emboli identified.   LDL 79  HgbA1c 5.6  UDS pending   SCDs for VTE  prophylaxis  Aspirin 81 mg daily and clopidogrel 75 mg daily prior to admission, now on  aspirin 81 mg daily and clopidogrel 75 mg daily  Therapy recommendations:  HH PT recommended  Disposition:  discharge to home  Hypertension  Home meds:  Lisinopril 5, moetprolol 50 bid  Stable  BP goal < 180/105 post tPA administration  Long-term BP goal normotensive  Hyperlipidemia  Home meds:  lipitor 80, resumed in hospital  LDL 79, goal < 70  Continue statin at discharge  Other Stroke Risk Factors  Advanced age  Former Cigarette smoker  Hx of STEMI  Other Active Problems  ?? ALS  "Borderline" Alzheimer's   PPI added  Hypothyroid on synthroid   Crohn's disease  Mild bradycardia    DISCHARGE EXAM Vitals:   11/18/18 2333 11/19/18 0320 11/19/18 0812 11/19/18 1139  BP: 125/63 110/89 (!) 153/72 (!) 156/80  Pulse: (!) 57 (!) 58 60 64  Resp: 20 20 18 20   Temp: 98.7 F (37.1 C) 98.9 F (37.2 C) 98.3 F (36.8 C) 99.2 F (37.3 C)  TempSrc: Oral Oral Oral Oral  SpO2: 96% 97% 98% 99%  Weight:       General - Well nourished, well developed, in no apparent distress.  Ophthalmologic - fundi not visualized due to noncooperation.  Cardiovascular - Regular rate and rhythm.  Mental Status -  Level of arousal and orientation to time, place, and person were intact. Language including expression, naming, repetition, comprehension was assessed and found intact. Mild dysarthria Fund of Knowledge was assessed and was intact.  Cranial Nerves II - XII - II - Visual field intact OU. III, IV, VI - Extraocular movements intact. V - Facial sensation intact bilaterally. VII - Facial movement intact bilaterally. VIII - Hearing & vestibular intact bilaterally. X - Palate elevates symmetrically, poor dentition and mild dysarthria. XI - Chin turning & shoulder shrug intact bilaterally. XII - Tongue protrusion intact.  Motor Strength - The patient's strength was normal  in all extremities and pronator drift was absent.  Bulk was normal and fasciculations were absent.   Motor Tone - Muscle tone was assessed at the neck and appendages and was normal.  Reflexes - The patient's reflexes were symmetrical in all extremities and he had no pathological reflexes.  Sensory - Light touch, temperature/pinprick were assessed and were symmetrical.    Coordination - The patient had normal movements in the hands and feet with no ataxia or dysmetria.  Tremor was absent.  Gait and Station - deferred.  Discharge Diet    Diet Order            Diet Heart Room service appropriate? Yes with Assist; Fluid consistency: Thin  Diet effective now             liquids  DISCHARGE PLAN  Disposition:  Discharge to home with wife and Home Health therapies. 24 hour per day supervision / assistance recommended.  aspirin 81 mg daily and clopidogrel 75 mg daily for secondary stroke prevention. (From a neurology standpoint we would recommend Aspirin 81 mg per day with Plavix 75 mg per day for 3 weeks then Plavix alone ; however, if the patient needs dual antiplatelet therapy for CAD we would not be opposed. There was some  confusion about the patient's medications prior to admission although the pt's wife stated he was taking both aspirin and Plavix daily at home. We have asked them to bring all medication bottles to their next OV with Dr Venetia Maxon)  Ongoing risk factor control by Primary Care Physician at time of discharge  Follow-up Street, Sharon Mt, MD in 2 weeks.  Follow-up in [facility] in 4 weeks, office to schedule an appointment.     40 minutes were spent preparing discharge.  Mikey Bussing PA-C [facility] Pager [phone] 11/19/2018, 12:47 PM

## 2018-11-19 NOTE — Discharge Instructions (Signed)
Alteplase Treatment for Ischemic Stroke  Alteplase is a medicine that can dissolve blood clots. An ischemic stroke is caused by blood clots that block blood flow to the brain. Alteplase can help treat a stroke if it is given very shortly after stroke symptoms begin. It is most effective when it is used within 0-4 hours after the start of symptoms. Before giving this treatment, the health care provider will carefully consider whether the treatment is appropriate based on your age, condition, and other factors. Tell a health care provider about:  Any allergies you have.  All medicines you are taking, including blood thinners, vitamins, herbs, and over-the-counter medicines.  Any medical conditions you have.  Any blood disorders you have.  Any active bleeding in the last 21 days, including gastrointestinal (GI) or vaginal bleeding.  Any surgeries or procedures you have had.  Whether you are pregnant or may be pregnant.  When your stroke symptoms started. What are the risks? Generally, this is a safe treatment. However, problems may occur, including:  Bleeding into the brain.  Bleeding in other parts of the body.  Allergic reaction. What happens before the treatment?  Your health care provider will perform a physical exam and take a detailed medical history.  Your blood pressure, heart rate, and breathing rate (vital signs) will be monitored closely. You may be given medicines to adjust blood pressure, if needed.  You may have tests, including: ? Blood tests. ? A CT scan. What happens during the treatment?  An IV will be inserted into one of your veins.  Alteplase will be given to you through this IV, usually over the course of 1 hour.  In some cases, this medicine may be given directly into the affected artery through a thin, flexible tube (catheter). This is usually inserted in the artery at the top of your leg.  Your vital signs and brain function will be monitored  closely as you receive this medicine.  If bleeding occurs, the medicine will be stopped and appropriate therapy will be started. What can I expect after treatment?  You will be monitored frequently by your health care team in an intensive care unit or a stroke unit.  You will be evaluated by a speech therapist, physical therapist, or an occupational therapist.  If you had a catheter, you may have bruising, soreness, and swelling at the catheter insertion site.  It may take several days, weeks, or even months to fully determine how you responded to the alteplase treatment. Follow these instructions in the hospital: Medicines  Take over-the-counter and prescription medicines only as told by your health care provider. Activity  Do not get out of bed without help. This is for your safety.  Limit activity after your treatment.  Participate in stroke rehabilitation programs as told by your health care provider. General instructions  Tell a nurse or health care provider right away if you have any bleeding, bruising or injuries.  Use a soft-bristled toothbrush. Brush your teeth gently. Get help right away if you have:  Blood in your vomit, stool, or urine.  A serious fall or accident, or you hit your head.  Symptoms of an allergic reaction such as rash or difficulty breathing.  Any symptoms of a stroke. "BE FAST" is an easy way to remember the main warning signs: ? B - Balance. Signs are dizziness, sudden trouble walking, or loss of balance. ? E - Eyes. Signs are trouble seeing or a sudden change in how you see. ?  F - Face. Signs are sudden weakness or loss of feeling in the face, or the face or eyelid drooping on one side. ? A - Arms. Signs are weakness or loss of feeling in an arm. This happens suddenly and usually on one side of the body. ? S - Speech. Signs are sudden trouble speaking, slurred speech, or trouble understanding what people say. ? T - Time. Time to call emergency  services. Write down what time symptoms started.  Other signs of stroke, such as: ? A sudden, very bad headache with no known cause. ? Nausea or vomiting. ? Seizure. These symptoms may represent a serious problem that is an emergency. Do not wait to see if the symptoms will go away. Get medical help right away.  Summary  Alteplase is a medicine that can dissolve blood clots. It can be used to open blocked arteries during an ischemic stroke.  To be effective, this medicine must be given very shortly after stroke symptoms begin, within 4 hours after the start of symptoms.  You will be monitored closely by your health care team during and after receiving alteplase.  Get help right away if you are showing signs of bleeding or are having symptoms of stroke. This information is not intended to replace advice given to you by your health care provider. Make sure you discuss any questions you have with your health care provider. Document Released: 08/26/2007 Document Revised: 09/13/2017 Document Reviewed: 04/06/2017 Elsevier Patient Education  2020 [place] physical therapy has been recommended. 2. 24 hour per day supervision / assistance is recommended 3. Please see Dr Venetia Maxon in 1 - 2 weeks and have your medications reviewed at that time. Bring all of your medication bottles to that office visit.

## 2018-11-19 NOTE — Progress Notes (Signed)
STROKE TEAM PROGRESS NOTE   INTERVAL HISTORY Pt sitting in chair for breakfast. He stated that his weakness and numbness have been resolved. He has no deficit as per pt and he is asking for going home. MRI neg for acute stroke, CTA H/N unremarkable.   Vitals:   11/18/18 1950 11/18/18 2333 11/19/18 0320 11/19/18 0812  BP: (!) 176/77 125/63 110/89 (!) 153/72  Pulse: 67 (!) 57 (!) 58 60  Resp: 20 20 20 18   Temp: 99 F (37.2 C) 98.7 F (37.1 C) 98.9 F (37.2 C) 98.3 F (36.8 C)  TempSrc: Oral Oral Oral Oral  SpO2: 99% 96% 97% 98%  Weight:        CBC:  Recent Labs  Lab 11/17/18 1259 11/17/18 1306  WBC 8.3  --   NEUTROABS 6.1  --   HGB 13.7 13.9  HCT 42.9 41.0  MCV 95.1  --   PLT 183  --     Basic Metabolic Panel:  Recent Labs  Lab 11/17/18 1259 11/17/18 1306  NA 138 139  K 4.3 4.0  CL 103 104  CO2 24  --   GLUCOSE 104* 101*  BUN 12 13  CREATININE 1.24 1.10  CALCIUM 8.6*  --    Lipid Panel:     Component Value Date/Time   CHOL 137 11/18/2018 0208   TRIG 86 11/18/2018 0208   HDL 41 11/18/2018 0208   CHOLHDL 3.3 11/18/2018 0208   VLDL 17 11/18/2018 0208   LDLCALC 79 11/18/2018 0208   HgbA1c:  Lab Results  Component Value Date   HGBA1C 5.6 11/18/2018   Urine Drug Screen: No results found for: LABOPIA, COCAINSCRNUR, LABBENZ, AMPHETMU, THCU, LABBARB  Alcohol Level No results found for: [facility]  IMAGING  Ct Head Code Stroke Wo Contrast 11/17/2018 1. Stable since 2016 and negative for age noncontrast CT appearance of the brain. ASPECTS 10. 2. Left side paranasal sinus disease is new.    PHYSICAL EXAM  Temp:  [98.3 F (36.8 C)-99 F (37.2 C)] 98.3 F (36.8 C) (08/29 0812) Pulse Rate:  [56-67] 60 (08/29 0812) Resp:  [17-21] 18 (08/29 0812) BP: (110-176)/(63-103) 153/72 (08/29 0812) SpO2:  [96 %-100 %] 98 % (08/29 0812)  General - Well nourished, well developed, in no apparent distress.  Ophthalmologic - fundi not visualized due to  noncooperation.  Cardiovascular - Regular rate and rhythm.  Mental Status -  Level of arousal and orientation to time, place, and person were intact. Language including expression, naming, repetition, comprehension was assessed and found intact. Mild dysarthria Fund of Knowledge was assessed and was intact.  Cranial Nerves II - XII - II - Visual field intact OU. III, IV, VI - Extraocular movements intact. V - Facial sensation intact bilaterally. VII - Facial movement intact bilaterally. VIII - Hearing & vestibular intact bilaterally. X - Palate elevates symmetrically, poor dentition and mild dysarthria. XI - Chin turning & shoulder shrug intact bilaterally. XII - Tongue protrusion intact.  Motor Strength - The patient's strength was normal in all extremities and pronator drift was absent.  Bulk was normal and fasciculations were absent.   Motor Tone - Muscle tone was assessed at the neck and appendages and was normal.  Reflexes - The patient's reflexes were symmetrical in all extremities and he had no pathological reflexes.  Sensory - Light touch, temperature/pinprick were assessed and were symmetrical.    Coordination - The patient had normal movements in the hands and feet with no ataxia or dysmetria.  Tremor was absent.  Gait and Station - deferred.   ASSESSMENT/PLAN Mr. Derek Vargas is a 68 y.o. male with history of HTN, HLD, Crohn's, MI on DAPT, ALS, and "borderline" Alzheimer's  presenting with L sided weakness and dysarthria (edentulous). Received tPA 11/17/2018 at 1323.  Stroke vs. TIA s/p tPA - MRI pending  Code Stroke CT head No acute abnormality.   CTA head & neck No significant stenoses  MRI  No acute findings  2D Echo pending  LDL 79  HgbA1c 5.6  SCDs for VTE prophylaxis  aspirin 81 mg daily and clopidogrel 75 mg daily prior to admission, now on No antithrombotic as within 24h of tPA administration. Plan resume DUAP but given confusion on his meds he  needs follow up with pcp. From sytroke perspective he would need 3 weeks DUAP and then plavix alone but if he is on continuous DUAP then he should continue. Discussed with patient and his wife(wife also unclear on his meds)    Therapy recommendations:  SLP    Disposition:  pending (lives with wife PTA)  Hypertension  Home meds:  Lisinopril 5, moetprolol 50 bid  Stable BP goal < 180/105 post tPA administration . Long-term BP goal normotensive  Hyperlipidemia  Home meds:  lipitor 80, resumed in hospital  LDL 79, goal < 70  Continue statin at discharge  Other Stroke Risk Factors  Advanced age  Former Cigarette smoker  Hx of STEMI  Other Active Problems  ?? ALS  "borderline" Alzheimer's   PPI added  Hypothyroid on synthroid   Crohn's disease  Hospital day # 2  Personally examined patient and images, and have participated in and made any corrections needed to history, physical, neuro exam,assessment and plan as stated above.  I have personally obtained the history, evaluated lab date, reviewed imaging studies and agree with radiology interpretations.    Sarina Ill, MD Stroke Neurology   A total of 25 minutes was spent for the care of this patient, spent on counseling patient and family on different diagnostic and therapeutic options, counseling and coordination of care, riskd ans benefits of management, compliance, or risk factor reduction and education.     To contact Stroke Continuity provider, please refer to [email or web address] After hours, contact General Neurology
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
## 2018-11-19 NOTE — Progress Notes (Signed)
AVS reviewed with patient and patient given a copy to take home. Patient dressed, all lines removed, and belongings packed. Patient taken via wheelchair by CNA to family vehicle for discharge.

## 2018-11-19 NOTE — Progress Notes (Signed)
Physical Therapy Treatment Patient Details Name: Derek Vargas MRN: [id number] DOB: [birth year] Today's Date: 11/19/2018    History of Present Illness Pt is a 68 y.o. M with significant PMH of Lou Gehrig disease, HTN, Crohn's disease, and "borderline" Alzheimer's per wife. Presents with acute onset of light sided weakness, dysarthria and left facial droop. TPA administered. CT negative for acute changes. MRI showing mild brain volume loss, left paranasal sinus obstruction inflammation.    PT Comments    Patient seen for mobility progression. Pt presents with unsteady gait and requires min guard/min A for OOB mobility. Recommend RW and 24 hour assist/supervision if pt is to d/c home given history of falls and cognitive deficits. Pt will continue to benefit from further skilled PT services to maximize independence and safety with mobility.     Follow Up Recommendations  CIR;Supervision/Assistance - 24 hour     Equipment Recommendations  Rolling walker with 5" wheels    Recommendations for Other Services       Precautions / Restrictions Precautions Precautions: Fall Precaution Comments: pt reports history of falls Restrictions Weight Bearing Restrictions: No    Mobility  Bed Mobility Overal bed mobility: Independent                Transfers Overall transfer level: Modified independent Equipment used: None Transfers: Sit to/from Stand              Ambulation/Gait Ambulation/Gait assistance: Min assist;Min guard Gait Distance (Feet): (~400 total during session) Assistive device: None;Straight cane Gait Pattern/deviations: Step-through pattern;Wide base of support;Drifts right/left Gait velocity: decreased   General Gait Details: lateral sway; pt reports L hip pain and weakness at baseline and that L knee buckles at times; no knee buckling noted during session; pt requires assist at times due to LOB; pt is unable to safely use SPC given difficulty with  sequencing   Stairs             Wheelchair Mobility    Modified Rankin (Stroke Patients Only) Modified Rankin (Stroke Patients Only) Pre-Morbid Rankin Score: Slight disability Modified Rankin: Moderately severe disability     Balance Overall balance assessment: Needs assistance   Sitting balance-Leahy Scale: Good     Standing balance support: No upper extremity supported;During functional activity Standing balance-Leahy Scale: Fair                   Standardized Balance Assessment Standardized Balance Assessment : Dynamic Gait Index   Dynamic Gait Index Level Surface: Mild Impairment Change in Gait Speed: Mild Impairment Gait with Horizontal Head Turns: Mild Impairment Gait with Vertical Head Turns: Moderate Impairment Gait and Pivot Turn: Normal Step Over Obstacle: Normal Step Around Obstacles: Mild Impairment Steps: Mild Impairment Total Score: 17      Cognition Arousal/Alertness: Awake/alert Behavior During Therapy: WFL for tasks assessed/performed Overall Cognitive Status: History of cognitive impairments - at baseline                                        Exercises      General Comments        Pertinent Vitals/Pain Pain Assessment: No/denies pain    Home Living                      Prior Function            PT Goals (current goals can now  be found in the care plan section) Acute Rehab PT Goals Patient Stated Goal: "go home." Progress towards PT goals: Progressing toward goals    Frequency    Min 4X/week      PT Plan Equipment recommendations need to be updated    Co-evaluation              AM-PAC PT "6 Clicks" Mobility   Outcome Measure  Help needed turning from your back to your side while in a flat bed without using bedrails?: None Help needed moving from lying on your back to sitting on the side of a flat bed without using bedrails?: None Help needed moving to and from a bed to a  chair (including a wheelchair)?: None Help needed standing up from a chair using your arms (e.g., wheelchair or bedside chair)?: None Help needed to walk in hospital room?: A Little Help needed climbing 3-5 steps with a railing? : A Little 6 Click Score: 22    End of Session Equipment Utilized During Treatment: Gait belt Activity Tolerance: Patient tolerated treatment well Patient left: in chair;with call bell/phone within reach;with chair alarm set Nurse Communication: Mobility status PT Visit Diagnosis: Unsteadiness on feet (R26.81)     Time: [id number] PT Time Calculation (min) (ACUTE ONLY): 29 min  Charges:  $Gait Training: 23-37 mins                     Earney Navy, PTA Acute Rehabilitation Services Pager: [phone] Office: [phone]     Darliss Cheney 11/19/2018, 10:37 AM

## 2018-11-19 NOTE — TOC Transition Note (Signed)
Transition of Care [facility]) - CM/SW Discharge Note   Patient Details  Name: Derek Vargas MRN: [id number] Date of Birth: [birth year]  Transition of Care [facility]) CM/SW Contact:  Carles Collet, RN Phone Number: 11/19/2018, 2:44 PM   Clinical Narrative:    Spoke w patient, agreeable to [facility] services wishes to use [facility], RW to be delivered to room prior to DC.      Final next level of care: [place] Barriers to Discharge: No Barriers Identified   Patient Goals and CMS Choice Patient states their goals for this hospitalization and ongoing recovery are:: to go home      Discharge Placement                       Discharge Plan and Services                DME Arranged: Walker rolling DME Agency: [employer] Date DME Agency Contacted: 11/19/18 Time DME Agency Contacted: [phone] Representative spoke with at DME Agency: [place]: PT, OT [place] Agency: [place] Date [place]: 11/19/18 Time [place]: 1444 Representative spoke with at [place]: [place] ([place]) Interventions     Readmission Risk Interventions No flowsheet data found.

## 2018-11-21 DIAGNOSIS — E7800 Pure hypercholesterolemia, unspecified: Secondary | ICD-10-CM | POA: Diagnosis not present

## 2018-11-21 DIAGNOSIS — K5090 Crohn's disease, unspecified, without complications: Secondary | ICD-10-CM | POA: Diagnosis not present

## 2018-11-21 DIAGNOSIS — I10 Essential (primary) hypertension: Secondary | ICD-10-CM | POA: Diagnosis not present

## 2018-11-21 DIAGNOSIS — I69311 Memory deficit following cerebral infarction: Secondary | ICD-10-CM | POA: Diagnosis not present

## 2018-11-21 DIAGNOSIS — M1990 Unspecified osteoarthritis, unspecified site: Secondary | ICD-10-CM | POA: Diagnosis not present

## 2018-11-21 DIAGNOSIS — I69354 Hemiplegia and hemiparesis following cerebral infarction affecting left non-dominant side: Secondary | ICD-10-CM | POA: Diagnosis not present

## 2018-11-21 DIAGNOSIS — I69322 Dysarthria following cerebral infarction: Secondary | ICD-10-CM | POA: Diagnosis not present

## 2018-11-21 DIAGNOSIS — G1221 Amyotrophic lateral sclerosis: Secondary | ICD-10-CM | POA: Diagnosis not present

## 2018-11-21 DIAGNOSIS — I69392 Facial weakness following cerebral infarction: Secondary | ICD-10-CM | POA: Diagnosis not present

## 2018-11-22 DIAGNOSIS — E7800 Pure hypercholesterolemia, unspecified: Secondary | ICD-10-CM | POA: Diagnosis not present

## 2018-11-22 DIAGNOSIS — E785 Hyperlipidemia, unspecified: Secondary | ICD-10-CM | POA: Diagnosis not present

## 2018-11-22 DIAGNOSIS — I69354 Hemiplegia and hemiparesis following cerebral infarction affecting left non-dominant side: Secondary | ICD-10-CM | POA: Diagnosis not present

## 2018-11-22 DIAGNOSIS — E663 Overweight: Secondary | ICD-10-CM | POA: Diagnosis not present

## 2018-11-22 DIAGNOSIS — G1221 Amyotrophic lateral sclerosis: Secondary | ICD-10-CM | POA: Diagnosis not present

## 2018-11-22 DIAGNOSIS — I10 Essential (primary) hypertension: Secondary | ICD-10-CM | POA: Diagnosis not present

## 2018-11-22 DIAGNOSIS — Z8673 Personal history of transient ischemic attack (TIA), and cerebral infarction without residual deficits: Secondary | ICD-10-CM | POA: Diagnosis not present

## 2018-11-22 DIAGNOSIS — I69322 Dysarthria following cerebral infarction: Secondary | ICD-10-CM | POA: Diagnosis not present

## 2018-11-22 DIAGNOSIS — I69311 Memory deficit following cerebral infarction: Secondary | ICD-10-CM | POA: Diagnosis not present

## 2018-11-22 DIAGNOSIS — I69392 Facial weakness following cerebral infarction: Secondary | ICD-10-CM | POA: Diagnosis not present

## 2018-11-22 DIAGNOSIS — F322 Major depressive disorder, single episode, severe without psychotic features: Secondary | ICD-10-CM | POA: Diagnosis not present

## 2018-11-22 DIAGNOSIS — K5090 Crohn's disease, unspecified, without complications: Secondary | ICD-10-CM | POA: Diagnosis not present

## 2018-11-22 DIAGNOSIS — R251 Tremor, unspecified: Secondary | ICD-10-CM | POA: Diagnosis not present

## 2018-11-22 DIAGNOSIS — I679 Cerebrovascular disease, unspecified: Secondary | ICD-10-CM | POA: Diagnosis not present

## 2018-11-22 DIAGNOSIS — F411 Generalized anxiety disorder: Secondary | ICD-10-CM | POA: Diagnosis not present

## 2018-11-22 DIAGNOSIS — Z6827 Body mass index (BMI) 27.0-27.9, adult: Secondary | ICD-10-CM | POA: Diagnosis not present

## 2018-11-22 DIAGNOSIS — M1990 Unspecified osteoarthritis, unspecified site: Secondary | ICD-10-CM | POA: Diagnosis not present

## 2018-11-23 DIAGNOSIS — K5090 Crohn's disease, unspecified, without complications: Secondary | ICD-10-CM | POA: Diagnosis not present

## 2018-11-23 DIAGNOSIS — E7800 Pure hypercholesterolemia, unspecified: Secondary | ICD-10-CM | POA: Diagnosis not present

## 2018-11-23 DIAGNOSIS — I69354 Hemiplegia and hemiparesis following cerebral infarction affecting left non-dominant side: Secondary | ICD-10-CM | POA: Diagnosis not present

## 2018-11-23 DIAGNOSIS — G1221 Amyotrophic lateral sclerosis: Secondary | ICD-10-CM | POA: Diagnosis not present

## 2018-11-23 DIAGNOSIS — M1990 Unspecified osteoarthritis, unspecified site: Secondary | ICD-10-CM | POA: Diagnosis not present

## 2018-11-23 DIAGNOSIS — I10 Essential (primary) hypertension: Secondary | ICD-10-CM | POA: Diagnosis not present

## 2018-11-23 DIAGNOSIS — I69322 Dysarthria following cerebral infarction: Secondary | ICD-10-CM | POA: Diagnosis not present

## 2018-11-23 DIAGNOSIS — I69392 Facial weakness following cerebral infarction: Secondary | ICD-10-CM | POA: Diagnosis not present

## 2018-11-23 DIAGNOSIS — I69311 Memory deficit following cerebral infarction: Secondary | ICD-10-CM | POA: Diagnosis not present

## 2018-11-25 DIAGNOSIS — I10 Essential (primary) hypertension: Secondary | ICD-10-CM | POA: Diagnosis not present

## 2018-11-25 DIAGNOSIS — I69392 Facial weakness following cerebral infarction: Secondary | ICD-10-CM | POA: Diagnosis not present

## 2018-11-25 DIAGNOSIS — I69354 Hemiplegia and hemiparesis following cerebral infarction affecting left non-dominant side: Secondary | ICD-10-CM | POA: Diagnosis not present

## 2018-11-25 DIAGNOSIS — I69311 Memory deficit following cerebral infarction: Secondary | ICD-10-CM | POA: Diagnosis not present

## 2018-11-25 DIAGNOSIS — I69322 Dysarthria following cerebral infarction: Secondary | ICD-10-CM | POA: Diagnosis not present

## 2018-11-25 DIAGNOSIS — E7800 Pure hypercholesterolemia, unspecified: Secondary | ICD-10-CM | POA: Diagnosis not present

## 2018-11-25 DIAGNOSIS — K5090 Crohn's disease, unspecified, without complications: Secondary | ICD-10-CM | POA: Diagnosis not present

## 2018-11-25 DIAGNOSIS — G1221 Amyotrophic lateral sclerosis: Secondary | ICD-10-CM | POA: Diagnosis not present

## 2018-11-25 DIAGNOSIS — M1990 Unspecified osteoarthritis, unspecified site: Secondary | ICD-10-CM | POA: Diagnosis not present

## 2018-11-29 DIAGNOSIS — G1221 Amyotrophic lateral sclerosis: Secondary | ICD-10-CM | POA: Diagnosis not present

## 2018-11-29 DIAGNOSIS — K5090 Crohn's disease, unspecified, without complications: Secondary | ICD-10-CM | POA: Diagnosis not present

## 2018-11-29 DIAGNOSIS — I69322 Dysarthria following cerebral infarction: Secondary | ICD-10-CM | POA: Diagnosis not present

## 2018-11-29 DIAGNOSIS — I69354 Hemiplegia and hemiparesis following cerebral infarction affecting left non-dominant side: Secondary | ICD-10-CM | POA: Diagnosis not present

## 2018-11-29 DIAGNOSIS — I10 Essential (primary) hypertension: Secondary | ICD-10-CM | POA: Diagnosis not present

## 2018-11-29 DIAGNOSIS — E7800 Pure hypercholesterolemia, unspecified: Secondary | ICD-10-CM | POA: Diagnosis not present

## 2018-11-29 DIAGNOSIS — I69311 Memory deficit following cerebral infarction: Secondary | ICD-10-CM | POA: Diagnosis not present

## 2018-11-29 DIAGNOSIS — M1990 Unspecified osteoarthritis, unspecified site: Secondary | ICD-10-CM | POA: Diagnosis not present

## 2018-11-29 DIAGNOSIS — I69392 Facial weakness following cerebral infarction: Secondary | ICD-10-CM | POA: Diagnosis not present

## 2018-11-30 ENCOUNTER — Other Ambulatory Visit: Payer: Self-pay

## 2018-11-30 NOTE — Patient Outreach (Signed)
[facility]) Care Management  11/30/2018  Derek Vargas Aug 15, 1950 [id number]   EMMI- Stroke RED ON EMMI ALERT Day # 9 Date:  11/29/2018 Red Alert Reason:  Lost interest in things they used to enjoy? Yes  Sad, hopeless, anxious, or empty? Yes    Outreach attempt: no answer.  Unable to leave a message.    Plan: RN CM will attempt patient again within 4 business days and send a letter.    Jone Baseman, RN, MSN [facility] Care Management Care Management Coordinator Direct Line [phone] Toll Free: [phone]  Fax: [phone]

## 2018-12-01 ENCOUNTER — Other Ambulatory Visit: Payer: Self-pay

## 2018-12-01 DIAGNOSIS — I69311 Memory deficit following cerebral infarction: Secondary | ICD-10-CM | POA: Diagnosis not present

## 2018-12-01 DIAGNOSIS — M1990 Unspecified osteoarthritis, unspecified site: Secondary | ICD-10-CM | POA: Diagnosis not present

## 2018-12-01 DIAGNOSIS — G1221 Amyotrophic lateral sclerosis: Secondary | ICD-10-CM | POA: Diagnosis not present

## 2018-12-01 DIAGNOSIS — K5090 Crohn's disease, unspecified, without complications: Secondary | ICD-10-CM | POA: Diagnosis not present

## 2018-12-01 DIAGNOSIS — I69322 Dysarthria following cerebral infarction: Secondary | ICD-10-CM | POA: Diagnosis not present

## 2018-12-01 DIAGNOSIS — E7800 Pure hypercholesterolemia, unspecified: Secondary | ICD-10-CM | POA: Diagnosis not present

## 2018-12-01 DIAGNOSIS — I69392 Facial weakness following cerebral infarction: Secondary | ICD-10-CM | POA: Diagnosis not present

## 2018-12-01 DIAGNOSIS — I10 Essential (primary) hypertension: Secondary | ICD-10-CM | POA: Diagnosis not present

## 2018-12-01 DIAGNOSIS — I69354 Hemiplegia and hemiparesis following cerebral infarction affecting left non-dominant side: Secondary | ICD-10-CM | POA: Diagnosis not present

## 2018-12-01 NOTE — Patient Outreach (Signed)
[place] [facility]) Care Management  12/01/2018  Derek Vargas [birth year] [id number]   EMMI- Stroke RED ON EMMI ALERT Day # 9 Date: 11/29/2018 Red Alert Reason: Lost interest in things they used to enjoy? Yes  Sad, hopeless, anxious, or empty? Yes    Outreach attempt:  spoke with patient  Addressed reason for call.  Patient reports that the response was a mistake.  He states that he is doing good and has follow up with his physician and PT is coming out to see him.  He feels he is doing good with progress.  Patient reports that he has all his medications and taking as prescribed.  Patient states he has plenty of family support.  He denies need for any further support at this time.  Advised patient that he would continue to get automated phone calls and a nurse will follow up as needed.  He verbalized understanding.    Plan: RN CM will close case at this time.    Jone Baseman, RN, MSN [place] Management Care Management Coordinator Direct Line [phone] Cell [phone] Toll Free: [phone]  Fax: [phone]

## 2018-12-02 DIAGNOSIS — I69354 Hemiplegia and hemiparesis following cerebral infarction affecting left non-dominant side: Secondary | ICD-10-CM | POA: Diagnosis not present

## 2018-12-02 DIAGNOSIS — G1221 Amyotrophic lateral sclerosis: Secondary | ICD-10-CM | POA: Diagnosis not present

## 2018-12-02 DIAGNOSIS — I69311 Memory deficit following cerebral infarction: Secondary | ICD-10-CM | POA: Diagnosis not present

## 2018-12-02 DIAGNOSIS — I69322 Dysarthria following cerebral infarction: Secondary | ICD-10-CM | POA: Diagnosis not present

## 2018-12-02 DIAGNOSIS — I10 Essential (primary) hypertension: Secondary | ICD-10-CM | POA: Diagnosis not present

## 2018-12-02 DIAGNOSIS — K5090 Crohn's disease, unspecified, without complications: Secondary | ICD-10-CM | POA: Diagnosis not present

## 2018-12-02 DIAGNOSIS — I69392 Facial weakness following cerebral infarction: Secondary | ICD-10-CM | POA: Diagnosis not present

## 2018-12-02 DIAGNOSIS — E7800 Pure hypercholesterolemia, unspecified: Secondary | ICD-10-CM | POA: Diagnosis not present

## 2018-12-02 DIAGNOSIS — M1990 Unspecified osteoarthritis, unspecified site: Secondary | ICD-10-CM | POA: Diagnosis not present

## 2018-12-05 DIAGNOSIS — K5090 Crohn's disease, unspecified, without complications: Secondary | ICD-10-CM | POA: Diagnosis not present

## 2018-12-05 DIAGNOSIS — E7800 Pure hypercholesterolemia, unspecified: Secondary | ICD-10-CM | POA: Diagnosis not present

## 2018-12-05 DIAGNOSIS — I10 Essential (primary) hypertension: Secondary | ICD-10-CM | POA: Diagnosis not present

## 2018-12-05 DIAGNOSIS — I69392 Facial weakness following cerebral infarction: Secondary | ICD-10-CM | POA: Diagnosis not present

## 2018-12-05 DIAGNOSIS — M1990 Unspecified osteoarthritis, unspecified site: Secondary | ICD-10-CM | POA: Diagnosis not present

## 2018-12-05 DIAGNOSIS — I69322 Dysarthria following cerebral infarction: Secondary | ICD-10-CM | POA: Diagnosis not present

## 2018-12-05 DIAGNOSIS — I69311 Memory deficit following cerebral infarction: Secondary | ICD-10-CM | POA: Diagnosis not present

## 2018-12-05 DIAGNOSIS — G1221 Amyotrophic lateral sclerosis: Secondary | ICD-10-CM | POA: Diagnosis not present

## 2018-12-05 DIAGNOSIS — I69354 Hemiplegia and hemiparesis following cerebral infarction affecting left non-dominant side: Secondary | ICD-10-CM | POA: Diagnosis not present

## 2018-12-06 DIAGNOSIS — I10 Essential (primary) hypertension: Secondary | ICD-10-CM | POA: Diagnosis not present

## 2018-12-06 DIAGNOSIS — G1221 Amyotrophic lateral sclerosis: Secondary | ICD-10-CM | POA: Diagnosis not present

## 2018-12-06 DIAGNOSIS — I69311 Memory deficit following cerebral infarction: Secondary | ICD-10-CM | POA: Diagnosis not present

## 2018-12-06 DIAGNOSIS — I69354 Hemiplegia and hemiparesis following cerebral infarction affecting left non-dominant side: Secondary | ICD-10-CM | POA: Diagnosis not present

## 2018-12-06 DIAGNOSIS — M1990 Unspecified osteoarthritis, unspecified site: Secondary | ICD-10-CM | POA: Diagnosis not present

## 2018-12-06 DIAGNOSIS — I69322 Dysarthria following cerebral infarction: Secondary | ICD-10-CM | POA: Diagnosis not present

## 2018-12-06 DIAGNOSIS — E7800 Pure hypercholesterolemia, unspecified: Secondary | ICD-10-CM | POA: Diagnosis not present

## 2018-12-06 DIAGNOSIS — K5090 Crohn's disease, unspecified, without complications: Secondary | ICD-10-CM | POA: Diagnosis not present

## 2018-12-06 DIAGNOSIS — I69392 Facial weakness following cerebral infarction: Secondary | ICD-10-CM | POA: Diagnosis not present

## 2018-12-14 DIAGNOSIS — I69311 Memory deficit following cerebral infarction: Secondary | ICD-10-CM | POA: Diagnosis not present

## 2018-12-14 DIAGNOSIS — I69392 Facial weakness following cerebral infarction: Secondary | ICD-10-CM | POA: Diagnosis not present

## 2018-12-14 DIAGNOSIS — G1221 Amyotrophic lateral sclerosis: Secondary | ICD-10-CM | POA: Diagnosis not present

## 2018-12-14 DIAGNOSIS — E7800 Pure hypercholesterolemia, unspecified: Secondary | ICD-10-CM | POA: Diagnosis not present

## 2018-12-14 DIAGNOSIS — M1990 Unspecified osteoarthritis, unspecified site: Secondary | ICD-10-CM | POA: Diagnosis not present

## 2018-12-14 DIAGNOSIS — I69354 Hemiplegia and hemiparesis following cerebral infarction affecting left non-dominant side: Secondary | ICD-10-CM | POA: Diagnosis not present

## 2018-12-14 DIAGNOSIS — I69322 Dysarthria following cerebral infarction: Secondary | ICD-10-CM | POA: Diagnosis not present

## 2018-12-14 DIAGNOSIS — K5090 Crohn's disease, unspecified, without complications: Secondary | ICD-10-CM | POA: Diagnosis not present

## 2018-12-14 DIAGNOSIS — I10 Essential (primary) hypertension: Secondary | ICD-10-CM | POA: Diagnosis not present

## 2018-12-19 DIAGNOSIS — K5090 Crohn's disease, unspecified, without complications: Secondary | ICD-10-CM | POA: Diagnosis not present

## 2018-12-19 DIAGNOSIS — I69392 Facial weakness following cerebral infarction: Secondary | ICD-10-CM | POA: Diagnosis not present

## 2018-12-19 DIAGNOSIS — G1221 Amyotrophic lateral sclerosis: Secondary | ICD-10-CM | POA: Diagnosis not present

## 2018-12-19 DIAGNOSIS — I10 Essential (primary) hypertension: Secondary | ICD-10-CM | POA: Diagnosis not present

## 2018-12-19 DIAGNOSIS — I69322 Dysarthria following cerebral infarction: Secondary | ICD-10-CM | POA: Diagnosis not present

## 2018-12-19 DIAGNOSIS — I69354 Hemiplegia and hemiparesis following cerebral infarction affecting left non-dominant side: Secondary | ICD-10-CM | POA: Diagnosis not present

## 2018-12-19 DIAGNOSIS — I69311 Memory deficit following cerebral infarction: Secondary | ICD-10-CM | POA: Diagnosis not present

## 2018-12-19 DIAGNOSIS — E7800 Pure hypercholesterolemia, unspecified: Secondary | ICD-10-CM | POA: Diagnosis not present

## 2018-12-19 DIAGNOSIS — M1990 Unspecified osteoarthritis, unspecified site: Secondary | ICD-10-CM | POA: Diagnosis not present

## 2018-12-22 ENCOUNTER — Other Ambulatory Visit: Payer: Self-pay

## 2018-12-22 ENCOUNTER — Ambulatory Visit (INDEPENDENT_AMBULATORY_CARE_PROVIDER_SITE_OTHER): Payer: Medicare HMO | Admitting: Adult Health

## 2018-12-22 ENCOUNTER — Encounter: Payer: Self-pay | Admitting: Adult Health

## 2018-12-22 VITALS — BP 148/77 | HR 54 | Temp 98.4°F | Ht 67.0 in | Wt 202.0 lb

## 2018-12-22 DIAGNOSIS — G459 Transient cerebral ischemic attack, unspecified: Secondary | ICD-10-CM

## 2018-12-22 DIAGNOSIS — E785 Hyperlipidemia, unspecified: Secondary | ICD-10-CM | POA: Diagnosis not present

## 2018-12-22 DIAGNOSIS — I10 Essential (primary) hypertension: Secondary | ICD-10-CM

## 2018-12-22 NOTE — Patient Instructions (Signed)
Continue aspirin 81 mg daily and clopidogrel 75 mg daily  and lipitor  for secondary stroke prevention  Continue to follow up with PCP regarding cholesterol and blood pressure management  Continue to stay active and maintain a healthy diet   Continue to monitor blood pressure at home  Maintain strict control of hypertension with blood pressure goal below 130/90, diabetes with hemoglobin A1c goal below 6.5% and cholesterol with LDL cholesterol (bad cholesterol) goal below 70 mg/dL. I also advised the patient to eat a healthy diet with plenty of whole grains, cereals, fruits and vegetables, exercise regularly and maintain ideal body weight.         Thank you for coming to see [place] at [facility]. I hope we have been able to provide you high quality care today.  You may receive a patient satisfaction survey over the next few weeks. We would appreciate your feedback and comments so that we may continue to improve ourselves and the health of our patients.

## 2018-12-22 NOTE — Progress Notes (Signed)
I agree with the above plan 

## 2018-12-22 NOTE — Progress Notes (Signed)
[facility] [address] [place]. [place] [address] [phone]       HOSPITAL FOLLOW UP NOTE  Mr. Derek Vargas Date of Birth:  [birth year] Medical Record Number:  [id number]   Reason for Referral:  hospital stroke follow up    CHIEF COMPLAINT:  Chief Complaint  Patient presents with  . Follow-up    hospital stroke follow up room in back room pt with wife Bessie and her temp is 96.9 pt states he sees a pychiatrist for his depression and anxiety    HPI: Derek Vargas being seen today for in office hospital follow-up regarding stroke versus TIA status post TPA on 11/17/2018.  History obtained from patient, wife and chart review. Reviewed all radiology images and labs personally.  Derek Vargas is a 68 y.o. Vargas with history of HTN, HLD, Crohn's, MI on DAPT, ALS, and "borderline" Alzheimer's  who presented on 11/17/2018 with L sided weakness and dysarthria (edentulous).  CT head negative for acute abnormality therefore Received tPA without complication.  CTA head/neck showed mild arthrosclerosis of the head and neck without large vessel occlusion or significant proximal stenosis.  MRI showed mild brain volume loss with cerebral hemispheres atrophy evidence of old small or large vessel infarction and left paranasal sinus obstruction inflammation.  2D echo normal EF without cardiac source of most identified.  LDL 79.  A1c 5.6.  Previously on DAPT and recommended continuation of discharge.  HTN stable and resumed lisinopril and metoprolol.  Recommended continuation of atorvastatin for HLD management.  No evidence or history of DM.  Other stroke risk factors include advanced age, former tobacco use and history of STEMI but no prior history of stroke.  Discharged home in stable condition with recommendation of home health PT.  Derek Vargas is being seen today for hospital follow-up accompanied by his wife.  He has recovered well without residual deficits or  reoccurring of symptoms.  He has returned back to all prior activities without difficulty.  Continues on aspirin and Plavix without bleeding or bruising.  Continues on atorvastatin without myalgias.  Blood pressure today slightly elevated for patient at 148/77.  He continues to participate in home health PT and continues to exercise daily and stays active.  Denies new or worsening stroke/TIA symptoms.    ROS:   14 system review of systems performed and negative with exception of no complaints  PMH:  Past Medical History:  Diagnosis Date  . Alzheimer's disease ([place])    BORDERLINE per wife  . Arthritis   . Crohn disease ([place])   . Hyperlipidemia   . Hypertension   . [name] disease [facility])   . STEMI (ST elevation myocardial infarction) ([place]) 2010    PSH:  Past Surgical History:  Procedure Laterality Date  . LEG SURGERY Right    1990s?    Social History:  Social History   Socioeconomic History  . Marital status: Married    Spouse name: Not on file  . Number of children: Not on file  . Years of education: Not on file  . Highest education level: Not on file  Occupational History  . Not on file  Social Needs  . Financial resource strain: Not on file  . Food insecurity    Worry: Not on file    Inability: Not on file  . Transportation needs    Medical: Not on file    Non-medical: Not on file  Tobacco Use  . Smoking status: Former Smoker  Types: Cigarettes  . Smokeless tobacco: Never Used  Substance and Sexual Activity  . Alcohol use: Never    Frequency: Never  . Drug use: Never  . Sexual activity: Not on file  Lifestyle  . Physical activity    Days per week: Not on file    Minutes per session: Not on file  . Stress: Not on file  Relationships  . Social Herbalist on phone: Not on file    Gets together: Not on file    Attends religious service: Not on file    Active member of club or organization: Not on file    Attends meetings of clubs or  organizations: Not on file    Relationship status: Not on file  . Intimate partner violence    Fear of current or ex partner: Not on file    Emotionally abused: Not on file    Physically abused: Not on file    Forced sexual activity: Not on file  Other Topics Concern  . Not on file  Social History Narrative  . Not on file    Family History: History reviewed. No pertinent family history.  Medications:   Current Outpatient Medications on File Prior to Visit  Medication Sig Dispense Refill  . aspirin EC 81 MG tablet Take 81 mg by mouth daily.    Marland Kitchen atorvastatin (LIPITOR) 80 MG tablet Take 80 mg by mouth daily.    Marland Kitchen buPROPion (WELLBUTRIN XL) 150 MG 24 hr tablet Take 150 mg by mouth daily.    . clonazePAM (KLONOPIN) 1 MG tablet Take by mouth.    . clopidogrel (PLAVIX) 75 MG tablet Take 1 tablet (75 mg total) by mouth daily. 30 tablet 3  . doxepin (SINEQUAN) 100 MG capsule Take 100 mg by mouth at bedtime.    Marland Kitchen FLUoxetine (PROZAC) 40 MG capsule Take 40 mg by mouth daily.    Marland Kitchen levothyroxine (SYNTHROID) 25 MCG tablet Take 25 mcg by mouth daily before breakfast.    . lisinopril (ZESTRIL) 5 MG tablet Take 5 mg by mouth daily.    . Melatonin 10 MG TABS Take 10 mg by mouth at bedtime.    . metoprolol tartrate (LOPRESSOR) 50 MG tablet Take 50 mg by mouth 2 (two) times daily.    . primidone (MYSOLINE) 250 MG tablet Take 1 tablet (250 mg total) by mouth 4 (four) times daily. 120 tablet 11  . QUEtiapine (SEROQUEL) 300 MG tablet Take 750 mg by mouth at bedtime.    . traMADol-acetaminophen (ULTRACET) 37.5-325 MG tablet Take 1 tablet by mouth 2 (two) times daily.      No current facility-administered medications on file prior to visit.     Allergies:   Allergies  Allergen Reactions  . Codeine Nausea And Vomiting     Physical Exam  Vitals:   12/22/18 1406  BP: (!) 148/77  Pulse: (!) 54  Temp: 98.4 F (36.9 C)  Weight: 202 lb (91.6 kg)  Height: 5\' 7"  (1.702 m)   Body mass index is  31.64 kg/m. No exam data present  Depression screen [facility] 2/9 12/22/2018  Decreased Interest 0  Down, Depressed, Hopeless 0  PHQ - 2 Score 0     General: well developed, well nourished,  pleasant middle-age Caucasian Vargas, seated, in no evident distress Head: head normocephalic and atraumatic.   Neck: supple with no carotid or supraclavicular bruits Cardiovascular: regular rate and rhythm, no murmurs Musculoskeletal: no deformity Skin:  no rash/petichiae Vascular:  Normal pulses all extremities   Neurologic Exam Mental Status: Awake and fully alert. Oriented to place and time. Recent and remote memory intact. Attention span, concentration and fund of knowledge appropriate during visit but occasional difficulty reported by wife.  Occasionally would speak about irrelevant topics.  Mood and affect appropriate.  Cranial Nerves: Fundoscopic exam reveals sharp disc margins. Pupils equal, briskly reactive to light. Extraocular movements full without nystagmus. Visual fields full to confrontation. Hearing intact. Facial sensation intact. Face, tongue, palate moves normally and symmetrically.  Motor: Normal bulk and tone. Normal strength in all tested extremity muscles. Sensory.: intact to touch , pinprick , position and vibratory sensation.  Coordination: Rapid alternating movements normal in all extremities. Finger-to-nose and heel-to-shin performed accurately bilaterally.  Very mild action tremors L>R Gait and Station: Arises from chair without difficulty. Stance is normal. Gait demonstrates normal stride length and balance Reflexes: 1+ and symmetric. Toes downgoing.     NIHSS  0 Modified Rankin  0   Diagnostic Data (Labs, Imaging, Testing)  Ct Angio Head W Or Wo Contrast Ct Angio Neck W Or Wo Contrast 11/18/2018 IMPRESSION:  Mild atherosclerosis in the head and neck without large vessel occlusion or significant proximal stenosis.   Mr Brain Wo Contrast 11/18/2018 IMPRESSION:  Mild  brain volume loss. Cerebral hemispheres show atrophy evidence of old small or large vessel infarction. Old or acute small or large vessel focal infarction. Left paranasal sinus obstruction inflammation.   Ct Head Code Stroke Wo Contrast 11/17/2018 IMPRESSION:  1. Stable since 2016 and negative for age noncontrast CT appearance of the brain. ASPECTS 10.  2. Left side paranasal sinus disease is new.   Transthoracic Echocardiogram  IMPRESSIONS 1. The left ventricle has normal systolic function with an ejection fraction of 60-65%. The cavity size was normal. Left ventricular diastolic parameters were normal. No evidence of left ventricular regional wall motion abnormalities. 2. The right ventricle has normal systolic function. The cavity was mildly enlarged. There is no increase in right ventricular wall thickness. 3. The mitral valve is abnormal. Mild thickening of the mitral valve leaflet. There is mild mitral annular calcification present. 4. The tricuspid valve is grossly normal. 5. The aortic valve is tricuspid. Mild sclerosis of the aortic valve. Aortic valve regurgitation is trivial by color flow Doppler. No stenosis of the aortic valve. 6. The aorta is normal unless otherwise noted. 7. The interatrial septum was not well visualized.    ASSESSMENT: Derek Vargas presented with left-sided weakness and dysarthria on 11/17/2018 with diagnosis of stroke versus TIA status post TPA. Vascular risk factors include HTN, HLD, prior tobacco use and history of STEMI.  Recovered well without residual deficits or reoccurring symptoms.  Currently being followed by [facility] neurology for essential tremors    PLAN:  1. Stroke versus TIA: Continue aspirin 81 mg daily and clopidogrel 75 mg daily  and atorvastatin for secondary stroke prevention.  From a stroke standpoint, continuation of Plavix for secondary stroke prevention however may need to continue DAPT  long-term due to history of CAD which will be further determined by cardiology.  Maintain strict control of hypertension with blood pressure goal below 130/90, diabetes with hemoglobin A1c goal below 6.5% and cholesterol with LDL cholesterol (bad cholesterol) goal below 70 mg/dL.  I also advised the patient to eat a healthy diet with plenty of whole grains, cereals, fruits and vegetables, exercise regularly with at least 30 minutes of continuous activity daily  and maintain ideal body weight. 2. HTN: Advised to continue current treatment regimen.  Today's BP stable.  Advised to continue to monitor at home along with continued follow-up with PCP for management 3. HLD: Advised to continue current treatment regimen along with continued follow-up with PCP for future prescribing and monitoring of lipid panel   Currently followed by [facility] neurology therefore no follow-up visit indicated as he is stable.  Advised him and his wife to call office with any questions or concerns in the future related to recent stroke   Greater than 50% of time during this 45 minute visit was spent on counseling, explanation of diagnosis of stroke versus TIA, reviewing risk factor management of HTN and HLD, planning of further management along with potential future management, and discussion with patient and family answering all questions.    Frann Rider, AGNP-BC  [facility] [address] [place], [place] [address]  Phone [phone] Fax [phone] Note: This document was prepared with digital dictation and possible smart phrase technology. Any transcriptional errors that result from this process are unintentional.

## 2019-01-04 DIAGNOSIS — F332 Major depressive disorder, recurrent severe without psychotic features: Secondary | ICD-10-CM | POA: Diagnosis not present

## 2019-01-17 DIAGNOSIS — Z7982 Long term (current) use of aspirin: Secondary | ICD-10-CM | POA: Diagnosis not present

## 2019-01-17 DIAGNOSIS — Z87891 Personal history of nicotine dependence: Secondary | ICD-10-CM | POA: Diagnosis not present

## 2019-01-17 DIAGNOSIS — I10 Essential (primary) hypertension: Secondary | ICD-10-CM | POA: Diagnosis not present

## 2019-01-17 DIAGNOSIS — E782 Mixed hyperlipidemia: Secondary | ICD-10-CM | POA: Diagnosis not present

## 2019-01-17 DIAGNOSIS — I2510 Atherosclerotic heart disease of native coronary artery without angina pectoris: Secondary | ICD-10-CM | POA: Diagnosis not present

## 2019-01-19 DIAGNOSIS — I69392 Facial weakness following cerebral infarction: Secondary | ICD-10-CM | POA: Diagnosis not present

## 2019-01-19 DIAGNOSIS — I69311 Memory deficit following cerebral infarction: Secondary | ICD-10-CM | POA: Diagnosis not present

## 2019-01-19 DIAGNOSIS — E7800 Pure hypercholesterolemia, unspecified: Secondary | ICD-10-CM | POA: Diagnosis not present

## 2019-01-19 DIAGNOSIS — K5090 Crohn's disease, unspecified, without complications: Secondary | ICD-10-CM | POA: Diagnosis not present

## 2019-01-19 DIAGNOSIS — G1221 Amyotrophic lateral sclerosis: Secondary | ICD-10-CM | POA: Diagnosis not present

## 2019-01-19 DIAGNOSIS — M1990 Unspecified osteoarthritis, unspecified site: Secondary | ICD-10-CM | POA: Diagnosis not present

## 2019-01-19 DIAGNOSIS — I69354 Hemiplegia and hemiparesis following cerebral infarction affecting left non-dominant side: Secondary | ICD-10-CM | POA: Diagnosis not present

## 2019-01-19 DIAGNOSIS — I10 Essential (primary) hypertension: Secondary | ICD-10-CM | POA: Diagnosis not present

## 2019-01-19 DIAGNOSIS — I69322 Dysarthria following cerebral infarction: Secondary | ICD-10-CM | POA: Diagnosis not present

## 2019-02-06 ENCOUNTER — Other Ambulatory Visit: Payer: Self-pay

## 2019-02-28 ENCOUNTER — Other Ambulatory Visit: Payer: Self-pay

## 2019-02-28 NOTE — Patient Outreach (Signed)
Telephone outreach to patient to obtain mRs was successfully completed. mRs=0.   [facility] Management Assistant

## 2019-04-05 DIAGNOSIS — F332 Major depressive disorder, recurrent severe without psychotic features: Secondary | ICD-10-CM | POA: Diagnosis not present

## 2019-07-10 DIAGNOSIS — F332 Major depressive disorder, recurrent severe without psychotic features: Secondary | ICD-10-CM | POA: Diagnosis not present

## 2019-07-21 DIAGNOSIS — I10 Essential (primary) hypertension: Secondary | ICD-10-CM | POA: Diagnosis not present

## 2019-07-21 DIAGNOSIS — E785 Hyperlipidemia, unspecified: Secondary | ICD-10-CM | POA: Diagnosis not present

## 2019-07-21 DIAGNOSIS — F411 Generalized anxiety disorder: Secondary | ICD-10-CM | POA: Diagnosis not present

## 2019-08-29 DIAGNOSIS — Z7982 Long term (current) use of aspirin: Secondary | ICD-10-CM | POA: Diagnosis not present

## 2019-08-29 DIAGNOSIS — I10 Essential (primary) hypertension: Secondary | ICD-10-CM | POA: Diagnosis not present

## 2019-08-29 DIAGNOSIS — E782 Mixed hyperlipidemia: Secondary | ICD-10-CM | POA: Diagnosis not present

## 2019-08-29 DIAGNOSIS — Z87891 Personal history of nicotine dependence: Secondary | ICD-10-CM | POA: Diagnosis not present

## 2019-08-29 DIAGNOSIS — I2510 Atherosclerotic heart disease of native coronary artery without angina pectoris: Secondary | ICD-10-CM | POA: Diagnosis not present

## 2019-09-15 DIAGNOSIS — Z79899 Other long term (current) drug therapy: Secondary | ICD-10-CM | POA: Diagnosis not present

## 2019-09-15 DIAGNOSIS — I679 Cerebrovascular disease, unspecified: Secondary | ICD-10-CM | POA: Diagnosis not present

## 2019-09-15 DIAGNOSIS — R739 Hyperglycemia, unspecified: Secondary | ICD-10-CM | POA: Diagnosis not present

## 2019-09-15 DIAGNOSIS — K5080 Crohn's disease of both small and large intestine without complications: Secondary | ICD-10-CM | POA: Diagnosis not present

## 2019-09-15 DIAGNOSIS — Z8673 Personal history of transient ischemic attack (TIA), and cerebral infarction without residual deficits: Secondary | ICD-10-CM | POA: Diagnosis not present

## 2019-09-15 DIAGNOSIS — Z0000 Encounter for general adult medical examination without abnormal findings: Secondary | ICD-10-CM | POA: Diagnosis not present

## 2019-09-15 DIAGNOSIS — Z125 Encounter for screening for malignant neoplasm of prostate: Secondary | ICD-10-CM | POA: Diagnosis not present

## 2019-09-15 DIAGNOSIS — I10 Essential (primary) hypertension: Secondary | ICD-10-CM | POA: Diagnosis not present

## 2019-09-15 DIAGNOSIS — E785 Hyperlipidemia, unspecified: Secondary | ICD-10-CM | POA: Diagnosis not present

## 2019-09-20 DIAGNOSIS — I10 Essential (primary) hypertension: Secondary | ICD-10-CM | POA: Diagnosis not present

## 2019-09-20 DIAGNOSIS — I679 Cerebrovascular disease, unspecified: Secondary | ICD-10-CM | POA: Diagnosis not present

## 2019-09-20 DIAGNOSIS — E785 Hyperlipidemia, unspecified: Secondary | ICD-10-CM | POA: Diagnosis not present

## 2019-10-04 DIAGNOSIS — R21 Rash and other nonspecific skin eruption: Secondary | ICD-10-CM | POA: Diagnosis not present

## 2019-10-04 DIAGNOSIS — W57XXXA Bitten or stung by nonvenomous insect and other nonvenomous arthropods, initial encounter: Secondary | ICD-10-CM | POA: Diagnosis not present

## 2019-10-09 DIAGNOSIS — F332 Major depressive disorder, recurrent severe without psychotic features: Secondary | ICD-10-CM | POA: Diagnosis not present

## 2019-11-16 DIAGNOSIS — L989 Disorder of the skin and subcutaneous tissue, unspecified: Secondary | ICD-10-CM | POA: Diagnosis not present

## 2019-11-16 DIAGNOSIS — Z6827 Body mass index (BMI) 27.0-27.9, adult: Secondary | ICD-10-CM | POA: Diagnosis not present

## 2019-11-23 DIAGNOSIS — Z20822 Contact with and (suspected) exposure to covid-19: Secondary | ICD-10-CM | POA: Diagnosis not present

## 2019-11-23 DIAGNOSIS — R05 Cough: Secondary | ICD-10-CM | POA: Diagnosis not present

## 2019-11-23 DIAGNOSIS — Z20828 Contact with and (suspected) exposure to other viral communicable diseases: Secondary | ICD-10-CM | POA: Diagnosis not present

## 2019-12-02 DIAGNOSIS — Z20828 Contact with and (suspected) exposure to other viral communicable diseases: Secondary | ICD-10-CM | POA: Diagnosis not present

## 2019-12-07 DIAGNOSIS — U071 COVID-19: Secondary | ICD-10-CM | POA: Diagnosis not present

## 2019-12-07 DIAGNOSIS — G894 Chronic pain syndrome: Secondary | ICD-10-CM | POA: Diagnosis not present

## 2019-12-09 ENCOUNTER — Telehealth: Payer: Self-pay | Admitting: Unknown Physician Specialty

## 2019-12-09 NOTE — Telephone Encounter (Signed)
Called to discuss with Derek Vargas about Covid symptoms and the use of bamlanivimab, a monoclonal antibody infusion for those with mild to moderate Covid symptoms and at a high risk of hospitalization.      Pt does not qualify for infusion therapy as symptoms first presented > 10 days prior to timing of infusion. Symptoms tier reviewed as well as criteria for ending isolation. Preventative practices reviewed. Patient verbalized understanding

## 2019-12-25 DIAGNOSIS — R052 Subacute cough: Secondary | ICD-10-CM | POA: Diagnosis not present

## 2019-12-25 DIAGNOSIS — M8949 Other hypertrophic osteoarthropathy, multiple sites: Secondary | ICD-10-CM | POA: Diagnosis not present

## 2020-01-05 DIAGNOSIS — I2510 Atherosclerotic heart disease of native coronary artery without angina pectoris: Secondary | ICD-10-CM | POA: Diagnosis not present

## 2020-01-05 DIAGNOSIS — Z7901 Long term (current) use of anticoagulants: Secondary | ICD-10-CM | POA: Diagnosis not present

## 2020-01-05 DIAGNOSIS — Z7982 Long term (current) use of aspirin: Secondary | ICD-10-CM | POA: Diagnosis not present

## 2020-01-05 DIAGNOSIS — I252 Old myocardial infarction: Secondary | ICD-10-CM | POA: Diagnosis not present

## 2020-01-05 DIAGNOSIS — D485 Neoplasm of uncertain behavior of skin: Secondary | ICD-10-CM | POA: Diagnosis not present

## 2020-01-12 DIAGNOSIS — J40 Bronchitis, not specified as acute or chronic: Secondary | ICD-10-CM | POA: Diagnosis not present

## 2020-01-12 DIAGNOSIS — F322 Major depressive disorder, single episode, severe without psychotic features: Secondary | ICD-10-CM | POA: Diagnosis not present

## 2020-01-12 DIAGNOSIS — J329 Chronic sinusitis, unspecified: Secondary | ICD-10-CM | POA: Diagnosis not present

## 2020-02-07 DIAGNOSIS — F332 Major depressive disorder, recurrent severe without psychotic features: Secondary | ICD-10-CM | POA: Diagnosis not present

## 2020-02-08 DIAGNOSIS — Z79899 Other long term (current) drug therapy: Secondary | ICD-10-CM | POA: Diagnosis not present

## 2020-03-05 DIAGNOSIS — I2510 Atherosclerotic heart disease of native coronary artery without angina pectoris: Secondary | ICD-10-CM | POA: Diagnosis not present

## 2020-03-05 DIAGNOSIS — E782 Mixed hyperlipidemia: Secondary | ICD-10-CM | POA: Diagnosis not present

## 2020-03-05 DIAGNOSIS — I10 Essential (primary) hypertension: Secondary | ICD-10-CM | POA: Diagnosis not present

## 2020-03-05 DIAGNOSIS — Z87891 Personal history of nicotine dependence: Secondary | ICD-10-CM | POA: Diagnosis not present

## 2020-03-05 DIAGNOSIS — U071 COVID-19: Secondary | ICD-10-CM | POA: Diagnosis not present

## 2020-03-05 DIAGNOSIS — Z7982 Long term (current) use of aspirin: Secondary | ICD-10-CM | POA: Diagnosis not present

## 2020-03-21 DIAGNOSIS — F322 Major depressive disorder, single episode, severe without psychotic features: Secondary | ICD-10-CM | POA: Diagnosis not present

## 2020-03-21 DIAGNOSIS — I10 Essential (primary) hypertension: Secondary | ICD-10-CM | POA: Diagnosis not present

## 2020-03-21 DIAGNOSIS — R251 Tremor, unspecified: Secondary | ICD-10-CM | POA: Diagnosis not present

## 2020-04-01 DIAGNOSIS — M7551 Bursitis of right shoulder: Secondary | ICD-10-CM | POA: Diagnosis not present

## 2020-04-01 DIAGNOSIS — M12811 Other specific arthropathies, not elsewhere classified, right shoulder: Secondary | ICD-10-CM | POA: Diagnosis not present

## 2020-04-01 DIAGNOSIS — K5080 Crohn's disease of both small and large intestine without complications: Secondary | ICD-10-CM | POA: Diagnosis not present

## 2020-04-01 DIAGNOSIS — F332 Major depressive disorder, recurrent severe without psychotic features: Secondary | ICD-10-CM | POA: Diagnosis not present

## 2020-04-01 DIAGNOSIS — I10 Essential (primary) hypertension: Secondary | ICD-10-CM | POA: Diagnosis not present

## 2020-04-01 DIAGNOSIS — F411 Generalized anxiety disorder: Secondary | ICD-10-CM | POA: Diagnosis not present

## 2020-04-01 DIAGNOSIS — E663 Overweight: Secondary | ICD-10-CM | POA: Diagnosis not present

## 2020-04-01 DIAGNOSIS — I672 Cerebral atherosclerosis: Secondary | ICD-10-CM | POA: Diagnosis not present

## 2020-04-01 DIAGNOSIS — G894 Chronic pain syndrome: Secondary | ICD-10-CM | POA: Diagnosis not present

## 2020-05-03 DIAGNOSIS — J40 Bronchitis, not specified as acute or chronic: Secondary | ICD-10-CM | POA: Diagnosis not present

## 2020-05-03 DIAGNOSIS — J329 Chronic sinusitis, unspecified: Secondary | ICD-10-CM | POA: Diagnosis not present

## 2020-05-03 DIAGNOSIS — E663 Overweight: Secondary | ICD-10-CM | POA: Diagnosis not present

## 2020-05-03 DIAGNOSIS — Z20822 Contact with and (suspected) exposure to covid-19: Secondary | ICD-10-CM | POA: Diagnosis not present

## 2020-06-05 DIAGNOSIS — F332 Major depressive disorder, recurrent severe without psychotic features: Secondary | ICD-10-CM | POA: Diagnosis not present

## 2020-09-03 DIAGNOSIS — F332 Major depressive disorder, recurrent severe without psychotic features: Secondary | ICD-10-CM | POA: Diagnosis not present

## 2020-09-05 DIAGNOSIS — I10 Essential (primary) hypertension: Secondary | ICD-10-CM | POA: Diagnosis not present

## 2020-09-05 DIAGNOSIS — Z87891 Personal history of nicotine dependence: Secondary | ICD-10-CM | POA: Diagnosis not present

## 2020-09-05 DIAGNOSIS — Z8616 Personal history of COVID-19: Secondary | ICD-10-CM | POA: Diagnosis not present

## 2020-09-05 DIAGNOSIS — E782 Mixed hyperlipidemia: Secondary | ICD-10-CM | POA: Diagnosis not present

## 2020-09-05 DIAGNOSIS — I2510 Atherosclerotic heart disease of native coronary artery without angina pectoris: Secondary | ICD-10-CM | POA: Diagnosis not present

## 2020-09-05 DIAGNOSIS — Z7982 Long term (current) use of aspirin: Secondary | ICD-10-CM | POA: Diagnosis not present

## 2020-09-09 DIAGNOSIS — E663 Overweight: Secondary | ICD-10-CM | POA: Diagnosis not present

## 2020-09-09 DIAGNOSIS — J0140 Acute pansinusitis, unspecified: Secondary | ICD-10-CM | POA: Diagnosis not present

## 2020-09-09 DIAGNOSIS — I672 Cerebral atherosclerosis: Secondary | ICD-10-CM | POA: Diagnosis not present

## 2020-09-09 DIAGNOSIS — Z6827 Body mass index (BMI) 27.0-27.9, adult: Secondary | ICD-10-CM | POA: Diagnosis not present

## 2020-09-09 DIAGNOSIS — R296 Repeated falls: Secondary | ICD-10-CM | POA: Diagnosis not present

## 2020-09-09 DIAGNOSIS — R42 Dizziness and giddiness: Secondary | ICD-10-CM | POA: Diagnosis not present

## 2020-09-09 DIAGNOSIS — R29898 Other symptoms and signs involving the musculoskeletal system: Secondary | ICD-10-CM | POA: Diagnosis not present

## 2020-09-18 DIAGNOSIS — R296 Repeated falls: Secondary | ICD-10-CM | POA: Diagnosis not present

## 2020-09-18 DIAGNOSIS — R519 Headache, unspecified: Secondary | ICD-10-CM | POA: Diagnosis not present

## 2020-09-19 DIAGNOSIS — I679 Cerebrovascular disease, unspecified: Secondary | ICD-10-CM | POA: Diagnosis not present

## 2020-09-19 DIAGNOSIS — E785 Hyperlipidemia, unspecified: Secondary | ICD-10-CM | POA: Diagnosis not present

## 2020-09-19 DIAGNOSIS — I10 Essential (primary) hypertension: Secondary | ICD-10-CM | POA: Diagnosis not present

## 2020-09-25 DIAGNOSIS — R29898 Other symptoms and signs involving the musculoskeletal system: Secondary | ICD-10-CM | POA: Diagnosis not present

## 2020-09-25 DIAGNOSIS — R296 Repeated falls: Secondary | ICD-10-CM | POA: Diagnosis not present

## 2020-09-25 DIAGNOSIS — E039 Hypothyroidism, unspecified: Secondary | ICD-10-CM | POA: Diagnosis not present

## 2020-09-25 DIAGNOSIS — Z8673 Personal history of transient ischemic attack (TIA), and cerebral infarction without residual deficits: Secondary | ICD-10-CM | POA: Diagnosis not present

## 2020-09-25 DIAGNOSIS — E785 Hyperlipidemia, unspecified: Secondary | ICD-10-CM | POA: Diagnosis not present

## 2020-09-25 DIAGNOSIS — I672 Cerebral atherosclerosis: Secondary | ICD-10-CM | POA: Diagnosis not present

## 2020-09-25 DIAGNOSIS — Z0000 Encounter for general adult medical examination without abnormal findings: Secondary | ICD-10-CM | POA: Diagnosis not present

## 2020-09-25 DIAGNOSIS — Z79899 Other long term (current) drug therapy: Secondary | ICD-10-CM | POA: Diagnosis not present

## 2020-09-25 DIAGNOSIS — R7301 Impaired fasting glucose: Secondary | ICD-10-CM | POA: Diagnosis not present

## 2020-10-14 DIAGNOSIS — U071 COVID-19: Secondary | ICD-10-CM | POA: Diagnosis not present

## 2020-10-14 DIAGNOSIS — I2510 Atherosclerotic heart disease of native coronary artery without angina pectoris: Secondary | ICD-10-CM | POA: Diagnosis not present

## 2020-10-25 DIAGNOSIS — J40 Bronchitis, not specified as acute or chronic: Secondary | ICD-10-CM | POA: Diagnosis not present

## 2020-10-25 DIAGNOSIS — J329 Chronic sinusitis, unspecified: Secondary | ICD-10-CM | POA: Diagnosis not present

## 2020-10-25 DIAGNOSIS — Z20828 Contact with and (suspected) exposure to other viral communicable diseases: Secondary | ICD-10-CM | POA: Diagnosis not present

## 2020-11-18 DIAGNOSIS — R29898 Other symptoms and signs involving the musculoskeletal system: Secondary | ICD-10-CM | POA: Diagnosis not present

## 2020-11-18 DIAGNOSIS — R42 Dizziness and giddiness: Secondary | ICD-10-CM | POA: Diagnosis not present

## 2020-11-18 DIAGNOSIS — M1991 Primary osteoarthritis, unspecified site: Secondary | ICD-10-CM | POA: Diagnosis not present

## 2020-11-20 DIAGNOSIS — E785 Hyperlipidemia, unspecified: Secondary | ICD-10-CM | POA: Diagnosis not present

## 2020-11-20 DIAGNOSIS — I679 Cerebrovascular disease, unspecified: Secondary | ICD-10-CM | POA: Diagnosis not present

## 2020-11-20 DIAGNOSIS — I10 Essential (primary) hypertension: Secondary | ICD-10-CM | POA: Diagnosis not present

## 2020-12-02 DIAGNOSIS — F332 Major depressive disorder, recurrent severe without psychotic features: Secondary | ICD-10-CM | POA: Diagnosis not present

## 2020-12-19 DIAGNOSIS — R29898 Other symptoms and signs involving the musculoskeletal system: Secondary | ICD-10-CM | POA: Diagnosis not present

## 2020-12-19 DIAGNOSIS — R42 Dizziness and giddiness: Secondary | ICD-10-CM | POA: Diagnosis not present

## 2020-12-19 DIAGNOSIS — M1991 Primary osteoarthritis, unspecified site: Secondary | ICD-10-CM | POA: Diagnosis not present

## 2020-12-22 DIAGNOSIS — S335XXA Sprain of ligaments of lumbar spine, initial encounter: Secondary | ICD-10-CM | POA: Diagnosis not present

## 2020-12-26 DIAGNOSIS — M461 Sacroiliitis, not elsewhere classified: Secondary | ICD-10-CM | POA: Diagnosis not present

## 2020-12-26 DIAGNOSIS — M62830 Muscle spasm of back: Secondary | ICD-10-CM | POA: Diagnosis not present

## 2020-12-26 DIAGNOSIS — G894 Chronic pain syndrome: Secondary | ICD-10-CM | POA: Diagnosis not present

## 2020-12-26 DIAGNOSIS — M1991 Primary osteoarthritis, unspecified site: Secondary | ICD-10-CM | POA: Diagnosis not present

## 2020-12-26 DIAGNOSIS — Z6828 Body mass index (BMI) 28.0-28.9, adult: Secondary | ICD-10-CM | POA: Diagnosis not present

## 2020-12-26 DIAGNOSIS — Z23 Encounter for immunization: Secondary | ICD-10-CM | POA: Diagnosis not present

## 2021-01-02 DIAGNOSIS — M5432 Sciatica, left side: Secondary | ICD-10-CM | POA: Diagnosis not present

## 2021-01-02 DIAGNOSIS — G894 Chronic pain syndrome: Secondary | ICD-10-CM | POA: Diagnosis not present

## 2021-01-02 DIAGNOSIS — Z6827 Body mass index (BMI) 27.0-27.9, adult: Secondary | ICD-10-CM | POA: Diagnosis not present

## 2021-01-06 DIAGNOSIS — Z23 Encounter for immunization: Secondary | ICD-10-CM | POA: Diagnosis not present

## 2021-01-10 ENCOUNTER — Inpatient Hospital Stay (HOSPITAL_COMMUNITY)
Admission: EM | Admit: 2021-01-10 | Discharge: 2021-01-13 | DRG: 872 | Disposition: A | Discharge status: Home Health | Payer: Medicare HMO | Attending: Family Medicine | Admitting: Family Medicine

## 2021-01-10 ENCOUNTER — Encounter (HOSPITAL_COMMUNITY): Payer: Self-pay | Admitting: Internal Medicine

## 2021-01-10 ENCOUNTER — Emergency Department (HOSPITAL_COMMUNITY): Payer: Medicare HMO

## 2021-01-10 ENCOUNTER — Other Ambulatory Visit: Payer: Self-pay

## 2021-01-10 DIAGNOSIS — G1221 Amyotrophic lateral sclerosis: Secondary | ICD-10-CM | POA: Diagnosis present

## 2021-01-10 DIAGNOSIS — F0280 Dementia in other diseases classified elsewhere without behavioral disturbance: Secondary | ICD-10-CM | POA: Diagnosis not present

## 2021-01-10 DIAGNOSIS — I2510 Atherosclerotic heart disease of native coronary artery without angina pectoris: Secondary | ICD-10-CM | POA: Diagnosis present

## 2021-01-10 DIAGNOSIS — R6510 Systemic inflammatory response syndrome (SIRS) of non-infectious origin without acute organ dysfunction: Secondary | ICD-10-CM | POA: Insufficient documentation

## 2021-01-10 DIAGNOSIS — K219 Gastro-esophageal reflux disease without esophagitis: Secondary | ICD-10-CM

## 2021-01-10 DIAGNOSIS — Y92009 Unspecified place in unspecified non-institutional (private) residence as the place of occurrence of the external cause: Secondary | ICD-10-CM

## 2021-01-10 DIAGNOSIS — G250 Essential tremor: Secondary | ICD-10-CM | POA: Diagnosis present

## 2021-01-10 DIAGNOSIS — I6523 Occlusion and stenosis of bilateral carotid arteries: Secondary | ICD-10-CM | POA: Diagnosis not present

## 2021-01-10 DIAGNOSIS — R0789 Other chest pain: Secondary | ICD-10-CM

## 2021-01-10 DIAGNOSIS — Z955 Presence of coronary angioplasty implant and graft: Secondary | ICD-10-CM

## 2021-01-10 DIAGNOSIS — K5090 Crohn's disease, unspecified, without complications: Secondary | ICD-10-CM | POA: Diagnosis not present

## 2021-01-10 DIAGNOSIS — N390 Urinary tract infection, site not specified: Secondary | ICD-10-CM | POA: Diagnosis not present

## 2021-01-10 DIAGNOSIS — Z87891 Personal history of nicotine dependence: Secondary | ICD-10-CM | POA: Diagnosis not present

## 2021-01-10 DIAGNOSIS — Q8909 Congenital malformations of spleen: Secondary | ICD-10-CM | POA: Diagnosis not present

## 2021-01-10 DIAGNOSIS — I10 Essential (primary) hypertension: Secondary | ICD-10-CM | POA: Diagnosis not present

## 2021-01-10 DIAGNOSIS — E782 Mixed hyperlipidemia: Secondary | ICD-10-CM

## 2021-01-10 DIAGNOSIS — S0990XA Unspecified injury of head, initial encounter: Secondary | ICD-10-CM | POA: Diagnosis not present

## 2021-01-10 DIAGNOSIS — I252 Old myocardial infarction: Secondary | ICD-10-CM | POA: Diagnosis not present

## 2021-01-10 DIAGNOSIS — A4151 Sepsis due to Escherichia coli [E. coli]: Secondary | ICD-10-CM | POA: Diagnosis not present

## 2021-01-10 DIAGNOSIS — R296 Repeated falls: Secondary | ICD-10-CM

## 2021-01-10 DIAGNOSIS — M2578 Osteophyte, vertebrae: Secondary | ICD-10-CM | POA: Diagnosis not present

## 2021-01-10 DIAGNOSIS — I672 Cerebral atherosclerosis: Secondary | ICD-10-CM | POA: Diagnosis not present

## 2021-01-10 DIAGNOSIS — Z8673 Personal history of transient ischemic attack (TIA), and cerebral infarction without residual deficits: Secondary | ICD-10-CM | POA: Diagnosis not present

## 2021-01-10 DIAGNOSIS — S301XXA Contusion of abdominal wall, initial encounter: Secondary | ICD-10-CM | POA: Diagnosis not present

## 2021-01-10 DIAGNOSIS — G309 Alzheimer's disease, unspecified: Secondary | ICD-10-CM | POA: Diagnosis present

## 2021-01-10 DIAGNOSIS — R319 Hematuria, unspecified: Secondary | ICD-10-CM

## 2021-01-10 DIAGNOSIS — Z20822 Contact with and (suspected) exposure to covid-19: Secondary | ICD-10-CM | POA: Diagnosis not present

## 2021-01-10 DIAGNOSIS — E039 Hypothyroidism, unspecified: Secondary | ICD-10-CM | POA: Diagnosis not present

## 2021-01-10 DIAGNOSIS — A419 Sepsis, unspecified organism: Secondary | ICD-10-CM

## 2021-01-10 DIAGNOSIS — R000 Tachycardia, unspecified: Secondary | ICD-10-CM | POA: Diagnosis not present

## 2021-01-10 DIAGNOSIS — N3000 Acute cystitis without hematuria: Secondary | ICD-10-CM | POA: Diagnosis not present

## 2021-01-10 DIAGNOSIS — F419 Anxiety disorder, unspecified: Secondary | ICD-10-CM | POA: Diagnosis present

## 2021-01-10 DIAGNOSIS — D72829 Elevated white blood cell count, unspecified: Secondary | ICD-10-CM | POA: Diagnosis not present

## 2021-01-10 DIAGNOSIS — N3289 Other specified disorders of bladder: Secondary | ICD-10-CM | POA: Diagnosis not present

## 2021-01-10 DIAGNOSIS — Z79890 Hormone replacement therapy (postmenopausal): Secondary | ICD-10-CM

## 2021-01-10 DIAGNOSIS — S199XXA Unspecified injury of neck, initial encounter: Secondary | ICD-10-CM | POA: Diagnosis not present

## 2021-01-10 DIAGNOSIS — W19XXXA Unspecified fall, initial encounter: Secondary | ICD-10-CM

## 2021-01-10 DIAGNOSIS — S299XXA Unspecified injury of thorax, initial encounter: Secondary | ICD-10-CM | POA: Diagnosis not present

## 2021-01-10 DIAGNOSIS — Z7982 Long term (current) use of aspirin: Secondary | ICD-10-CM

## 2021-01-10 DIAGNOSIS — Z66 Do not resuscitate: Secondary | ICD-10-CM | POA: Diagnosis not present

## 2021-01-10 DIAGNOSIS — Z8249 Family history of ischemic heart disease and other diseases of the circulatory system: Secondary | ICD-10-CM | POA: Diagnosis not present

## 2021-01-10 DIAGNOSIS — Z885 Allergy status to narcotic agent status: Secondary | ICD-10-CM

## 2021-01-10 DIAGNOSIS — Z79899 Other long term (current) drug therapy: Secondary | ICD-10-CM | POA: Diagnosis not present

## 2021-01-10 DIAGNOSIS — M47812 Spondylosis without myelopathy or radiculopathy, cervical region: Secondary | ICD-10-CM | POA: Diagnosis not present

## 2021-01-10 DIAGNOSIS — Z7902 Long term (current) use of antithrombotics/antiplatelets: Secondary | ICD-10-CM

## 2021-01-10 DIAGNOSIS — M4802 Spinal stenosis, cervical region: Secondary | ICD-10-CM | POA: Diagnosis not present

## 2021-01-10 DIAGNOSIS — E869 Volume depletion, unspecified: Secondary | ICD-10-CM | POA: Diagnosis present

## 2021-01-10 HISTORY — DX: Atherosclerotic heart disease of native coronary artery without angina pectoris: I25.10

## 2021-01-10 HISTORY — DX: Hypothyroidism, unspecified: E03.9

## 2021-01-10 HISTORY — DX: Gastro-esophageal reflux disease without esophagitis: K21.9

## 2021-01-10 HISTORY — DX: Mixed hyperlipidemia: E78.2

## 2021-01-10 LAB — URINALYSIS, ROUTINE W REFLEX MICROSCOPIC
Bilirubin Urine: NEGATIVE
Glucose, UA: NEGATIVE mg/dL
Ketones, ur: NEGATIVE mg/dL
Nitrite: POSITIVE — AB
Protein, ur: 100 mg/dL — AB
Specific Gravity, Urine: 1.02 (ref 1.005–1.030)
WBC, UA: >50 WBC/hpf — ABNORMAL HIGH (ref 0–5)
pH: 5 (ref 5.0–8.0)

## 2021-01-10 LAB — CBC WITH DIFFERENTIAL/PLATELET
Abs Immature Granulocytes: 0.06 10*3/uL (ref 0.00–0.07)
Basophils Absolute: 0.1 10*3/uL (ref 0.0–0.1)
Basophils Relative: 1 %
Eosinophils Absolute: 0 10*3/uL (ref 0.0–0.5)
Eosinophils Relative: 0 %
HCT: 37.4 % — ABNORMAL LOW (ref 39.0–52.0)
Hemoglobin: 12.5 g/dL — ABNORMAL LOW (ref 13.0–17.0)
Immature Granulocytes: 1 %
Lymphocytes Relative: 8 %
Lymphs Abs: 0.9 10*3/uL (ref 0.7–4.0)
MCH: 31.6 pg (ref 26.0–34.0)
MCHC: 33.4 g/dL (ref 30.0–36.0)
MCV: 94.4 fL (ref 80.0–100.0)
Monocytes Absolute: 1.5 10*3/uL — ABNORMAL HIGH (ref 0.1–1.0)
Monocytes Relative: 13 %
Neutro Abs: 9 10*3/uL — ABNORMAL HIGH (ref 1.7–7.7)
Neutrophils Relative %: 77 %
Platelets: 195 10*3/uL (ref 150–400)
RBC: 3.96 MIL/uL — ABNORMAL LOW (ref 4.22–5.81)
RDW: 14.6 % (ref 11.5–15.5)
WBC: 11.5 10*3/uL — ABNORMAL HIGH (ref 4.0–10.5)
nRBC: 0 % (ref 0.0–0.2)

## 2021-01-10 LAB — I-STAT CHEM 8, ED
BUN: 20 mg/dL (ref 8–23)
Calcium, Ion: 1.06 mmol/L — ABNORMAL LOW (ref 1.15–1.40)
Chloride: 99 mmol/L (ref 98–111)
Creatinine, Ser: 1.4 mg/dL — ABNORMAL HIGH (ref 0.61–1.24)
Glucose, Bld: 116 mg/dL — ABNORMAL HIGH (ref 70–99)
HCT: 33 % — ABNORMAL LOW (ref 39.0–52.0)
Hemoglobin: 11.2 g/dL — ABNORMAL LOW (ref 13.0–17.0)
Potassium: 4 mmol/L (ref 3.5–5.1)
Sodium: 136 mmol/L (ref 135–145)
TCO2: 26 mmol/L (ref 22–32)

## 2021-01-10 LAB — RESP PANEL BY RT-PCR (FLU A&B, COVID) ARPGX2
Influenza A by PCR: NEGATIVE
Influenza B by PCR: NEGATIVE
SARS Coronavirus 2 by RT PCR: NEGATIVE

## 2021-01-10 LAB — COMPREHENSIVE METABOLIC PANEL
ALT: 56 U/L — ABNORMAL HIGH (ref 0–44)
AST: 54 U/L — ABNORMAL HIGH (ref 15–41)
Albumin: 3.3 g/dL — ABNORMAL LOW (ref 3.5–5.0)
Alkaline Phosphatase: 91 U/L (ref 38–126)
Anion gap: 11 (ref 5–15)
BUN: 19 mg/dL (ref 8–23)
CO2: 25 mmol/L (ref 22–32)
Calcium: 9 mg/dL (ref 8.9–10.3)
Chloride: 98 mmol/L (ref 98–111)
Creatinine, Ser: 1.44 mg/dL — ABNORMAL HIGH (ref 0.61–1.24)
GFR, Estimated: 53 mL/min — ABNORMAL LOW (ref >60)
Glucose, Bld: 119 mg/dL — ABNORMAL HIGH (ref 70–99)
Potassium: 4 mmol/L (ref 3.5–5.1)
Sodium: 134 mmol/L — ABNORMAL LOW (ref 135–145)
Total Bilirubin: 1.4 mg/dL — ABNORMAL HIGH (ref 0.3–1.2)
Total Protein: 7.6 g/dL (ref 6.5–8.1)

## 2021-01-10 LAB — TROPONIN I (HIGH SENSITIVITY)
Troponin I (High Sensitivity): 27 ng/L — ABNORMAL HIGH (ref <18)
Troponin I (High Sensitivity): 29 ng/L — ABNORMAL HIGH (ref <18)

## 2021-01-10 LAB — LACTIC ACID, PLASMA
Lactic Acid, Venous: 1.2 mmol/L (ref 0.5–1.9)
Lactic Acid, Venous: 1.2 mmol/L (ref 0.5–1.9)

## 2021-01-10 MED ORDER — LACTATED RINGERS IV BOLUS (SEPSIS)
1000.0000 mL | Freq: Once | INTRAVENOUS | Status: AC
  Administered 2021-01-10: 1000 mL via INTRAVENOUS

## 2021-01-10 MED ORDER — PRIMIDONE 250 MG PO TABS
250.0000 mg | ORAL_TABLET | Freq: Four times a day (QID) | ORAL | Status: DC
  Administered 2021-01-10 – 2021-01-13 (×10): 250 mg via ORAL
  Filled 2021-01-10 (×13): qty 1

## 2021-01-10 MED ORDER — LEVOTHYROXINE SODIUM 25 MCG PO TABS
25.0000 ug | ORAL_TABLET | Freq: Every day | ORAL | Status: DC
  Administered 2021-01-11 – 2021-01-13 (×3): 25 ug via ORAL
  Filled 2021-01-10 (×3): qty 1

## 2021-01-10 MED ORDER — MELATONIN 5 MG PO TABS
10.0000 mg | ORAL_TABLET | Freq: Every day | ORAL | Status: DC
  Administered 2021-01-10 – 2021-01-12 (×3): 10 mg via ORAL
  Filled 2021-01-10 (×4): qty 2

## 2021-01-10 MED ORDER — METRONIDAZOLE 500 MG/100ML IV SOLN
500.0000 mg | Freq: Once | INTRAVENOUS | Status: AC
  Administered 2021-01-10: 500 mg via INTRAVENOUS
  Filled 2021-01-10: qty 100

## 2021-01-10 MED ORDER — SODIUM CHLORIDE 0.9 % IV SOLN
2.0000 g | Freq: Once | INTRAVENOUS | Status: AC
  Administered 2021-01-10: 2 g via INTRAVENOUS
  Filled 2021-01-10: qty 2

## 2021-01-10 MED ORDER — LACTATED RINGERS IV SOLN: INTRAVENOUS | Status: DC

## 2021-01-10 MED ORDER — FLUOXETINE HCL 20 MG PO CAPS
40.0000 mg | ORAL_CAPSULE | Freq: Every day | ORAL | Status: DC
  Administered 2021-01-11 – 2021-01-13 (×3): 40 mg via ORAL
  Filled 2021-01-10 (×3): qty 2

## 2021-01-10 MED ORDER — CLOPIDOGREL BISULFATE 75 MG PO TABS
75.0000 mg | ORAL_TABLET | Freq: Every day | ORAL | Status: DC
  Administered 2021-01-11 – 2021-01-13 (×3): 75 mg via ORAL
  Filled 2021-01-10 (×3): qty 1

## 2021-01-10 MED ORDER — QUETIAPINE FUMARATE 300 MG PO TABS
750.0000 mg | ORAL_TABLET | Freq: Every day | ORAL | Status: DC
  Administered 2021-01-11 – 2021-01-12 (×3): 750 mg via ORAL
  Filled 2021-01-10: qty 8
  Filled 2021-01-10 (×4): qty 3
  Filled 2021-01-10: qty 8
  Filled 2021-01-10: qty 3

## 2021-01-10 MED ORDER — POLYETHYLENE GLYCOL 3350 17 G PO PACK: 17.0000 g | PACK | Freq: Every day | ORAL | Status: DC | PRN

## 2021-01-10 MED ORDER — ONDANSETRON HCL 4 MG PO TABS: 4.0000 mg | ORAL_TABLET | Freq: Four times a day (QID) | ORAL | Status: DC | PRN

## 2021-01-10 MED ORDER — ONDANSETRON HCL 4 MG/2ML IJ SOLN: 4.0000 mg | Freq: Four times a day (QID) | INTRAMUSCULAR | Status: DC | PRN

## 2021-01-10 MED ORDER — ATORVASTATIN CALCIUM 80 MG PO TABS
80.0000 mg | ORAL_TABLET | Freq: Every day | ORAL | Status: DC
  Administered 2021-01-11 – 2021-01-13 (×3): 80 mg via ORAL
  Filled 2021-01-10 (×3): qty 1

## 2021-01-10 MED ORDER — ENOXAPARIN SODIUM 40 MG/0.4ML IJ SOSY
40.0000 mg | PREFILLED_SYRINGE | Freq: Every day | INTRAMUSCULAR | Status: DC
  Administered 2021-01-11 – 2021-01-13 (×3): 40 mg via SUBCUTANEOUS
  Filled 2021-01-10 (×3): qty 0.4

## 2021-01-10 MED ORDER — DOXEPIN HCL 50 MG PO CAPS
100.0000 mg | ORAL_CAPSULE | Freq: Every day | ORAL | Status: DC
  Administered 2021-01-10 – 2021-01-12 (×3): 100 mg via ORAL
  Filled 2021-01-10: qty 2
  Filled 2021-01-10: qty 1
  Filled 2021-01-10 (×3): qty 2

## 2021-01-10 MED ORDER — SODIUM CHLORIDE 0.9 % IV SOLN: INTRAVENOUS | Status: AC

## 2021-01-10 MED ORDER — CLONAZEPAM 0.5 MG PO TABS
1.0000 mg | ORAL_TABLET | Freq: Every day | ORAL | Status: DC
  Administered 2021-01-10 – 2021-01-13 (×4): 1 mg via ORAL
  Filled 2021-01-10 (×4): qty 2

## 2021-01-10 MED ORDER — LISINOPRIL 5 MG PO TABS
5.0000 mg | ORAL_TABLET | Freq: Every day | ORAL | Status: DC
  Administered 2021-01-11 – 2021-01-13 (×3): 5 mg via ORAL
  Filled 2021-01-10 (×3): qty 1

## 2021-01-10 MED ORDER — SODIUM CHLORIDE 0.9 % IV SOLN
1.0000 g | INTRAVENOUS | Status: DC
  Administered 2021-01-11 – 2021-01-13 (×3): 1 g via INTRAVENOUS
  Filled 2021-01-10 (×3): qty 10

## 2021-01-10 MED ORDER — METOPROLOL TARTRATE 50 MG PO TABS
50.0000 mg | ORAL_TABLET | Freq: Two times a day (BID) | ORAL | Status: DC
  Administered 2021-01-10 – 2021-01-13 (×6): 50 mg via ORAL
  Filled 2021-01-10 (×5): qty 1
  Filled 2021-01-10: qty 2

## 2021-01-10 MED ORDER — ACETAMINOPHEN 325 MG PO TABS
650.0000 mg | ORAL_TABLET | Freq: Four times a day (QID) | ORAL | Status: DC | PRN
  Administered 2021-01-11 – 2021-01-12 (×2): 650 mg via ORAL
  Filled 2021-01-10 (×2): qty 2

## 2021-01-10 MED ORDER — ACETAMINOPHEN 650 MG RE SUPP: 650.0000 mg | Freq: Four times a day (QID) | RECTAL | Status: DC | PRN

## 2021-01-10 MED ORDER — VANCOMYCIN HCL 2000 MG/400ML IV SOLN
2000.0000 mg | Freq: Once | INTRAVENOUS | Status: AC
  Administered 2021-01-10: 2000 mg via INTRAVENOUS
  Filled 2021-01-10: qty 400

## 2021-01-10 MED ORDER — QUETIAPINE FUMARATE 50 MG PO TABS
750.0000 mg | ORAL_TABLET | Freq: Every day | ORAL | Status: DC
  Filled 2021-01-10: qty 1

## 2021-01-10 MED ORDER — SODIUM CHLORIDE 0.9 % IV SOLN: 2.0000 g | Freq: Two times a day (BID) | INTRAVENOUS | Status: DC

## 2021-01-10 MED ORDER — ASPIRIN EC 81 MG PO TBEC
81.0000 mg | DELAYED_RELEASE_TABLET | Freq: Every day | ORAL | Status: DC
  Administered 2021-01-10 – 2021-01-13 (×4): 81 mg via ORAL
  Filled 2021-01-10 (×4): qty 1

## 2021-01-10 MED ORDER — IOHEXOL 300 MG/ML  SOLN
100.0000 mL | Freq: Once | INTRAMUSCULAR | Status: AC | PRN
  Administered 2021-01-10: 100 mL via INTRAVENOUS

## 2021-01-10 MED ORDER — ACETAMINOPHEN 500 MG PO TABS
1000.0000 mg | ORAL_TABLET | Freq: Once | ORAL | Status: AC
  Administered 2021-01-10: 1000 mg via ORAL
  Filled 2021-01-10: qty 2

## 2021-01-10 MED ORDER — VANCOMYCIN VARIABLE DOSE PER UNSTABLE RENAL FUNCTION (PHARMACIST DOSING): Status: DC

## 2021-01-10 NOTE — H&P (Signed)
History and Physical    Derek Vargas NLZ:[id number] DOB: 24-Jun-1950 DOA: 01/10/2021  PCP: Emmaline Kluver, MD  Patient coming from: Home   Chief Complaint:  Chief Complaint  Patient presents with   Fall   Fever     HPI:    70 year old male with past medical history of hypertension, hyperlipidemia, coronary artery disease (Hx STEMI with BMS to LAD 2010), anxiety disorder, hypothyroidism, essential tremor,  gastroesophageal reflux disease, Alzheimer's dementia, ALS, Crohn's disease, stroke (10/2018 S/P TPA) who presents to [facility] emergency department with complaints of frequent falls.  Patient explains that over the past 2 weeks he has been feeling quite unsteady with a "swimming sensation" when walking.  When asked further about this he describes it as lightheadedness.  Patient complains of associated generalized weakness that has become progressively become more and more severe.  This is led to the patient following sometimes 2-3 times daily over the past 2 weeks.  Patient denies focal weakness, changes in vision, facial droop, change in speech or confusion.  Patient symptoms continued to worsen and approximately 1 week ago the patient also began to notice that he was having lower abdominal pain and dysuria.  Yesterday, on 10/20 patient also developed a fever at home.  Patient denies associated nausea, vomiting, sick contacts, recent travel.    Patient eventually saw his primary care provider on 8/20.  Patient's primary care provider advised that the patient present to the emergency department for evaluation.  Upon evaluation at [facility] emergency department, patient was found to be febrile on arrival with a temperature of 101.2 F with additional SIRS criteria including tachycardia tachypnea and leukocytosis of 11.5.  Patient was initially given broad-spectrum intravenous antibiotics with metronidazole, vancomycin and cefepime.  Patient was initiated on  intravenous fluids.  Patient was found to have numerous ecchymoses over the back due to his frequent falls and therefore ER provider ordered CT imaging of the chest abdomen pelvis revealing no evidence of pneumonia or intra-abdominal source of infection.  Urinalysis was highly suggestive of a urinary tract infection.  CT head revealed no evidence of acute process.  Hospitalist group was then called to assess patient for admission to the hospital.    Review of Systems:   Review of Systems  Constitutional:  Positive for malaise/fatigue.  Genitourinary:  Positive for dysuria.  Musculoskeletal:  Positive for back pain and falls.  Neurological:  Positive for weakness.  All other systems reviewed and are negative.  Past Medical History:  Diagnosis Date   Alzheimer's disease ([place])    BORDERLINE per wife   Arthritis    Coronary artery disease involving native coronary artery of native heart without angina pectoris 01/10/2021   Crohn disease ([place])    GERD without esophagitis 01/10/2021   Hyperlipidemia    Hypertension    Hypothyroidism 01/10/2021   [name] disease [facility])    Mixed hyperlipidemia 01/10/2021   STEMI (ST elevation myocardial infarction) ([place]) 2010    Past Surgical History:  Procedure Laterality Date   LEG SURGERY Right    1990s?     reports that he has quit smoking. His smoking use included cigarettes. He has never used smokeless tobacco. He reports that he does not drink alcohol and does not use drugs.  Allergies  Allergen Reactions   Codeine Nausea And Vomiting    Family History  Problem Relation Age of Onset   Heart attack Neg Hx      Prior to Admission medications  Medication Sig Start Date End Date Taking? Authorizing Provider  aspirin EC 81 MG tablet Take 81 mg by mouth daily.   Yes [provider]  atorvastatin (LIPITOR) 80 MG tablet Take 80 mg by mouth daily.   Yes [provider]  baclofen (LIORESAL) 10 MG tablet Take 5-10 mg by  mouth 3 (three) times daily as needed for pain. 12/26/20  Yes [provider]  buPROPion (WELLBUTRIN XL) 150 MG 24 hr tablet Take 150 mg by mouth daily.   Yes [provider]  clonazePAM (KLONOPIN) 1 MG tablet Take 1 mg by mouth daily. 01/29/14  Yes [provider]  clopidogrel (PLAVIX) 75 MG tablet Take 1 tablet (75 mg total) by mouth daily. 11/20/18  Yes Rinehuls, Early Chars, PA-C  doxepin (SINEQUAN) 100 MG capsule Take 100 mg by mouth at bedtime. 10/05/18  Yes [provider]  FLUoxetine (PROZAC) 40 MG capsule Take 40 mg by mouth daily.   Yes [provider]  levothyroxine (SYNTHROID) 25 MCG tablet Take 25 mcg by mouth daily before breakfast.   Yes [provider]  lisinopril (ZESTRIL) 5 MG tablet Take 5 mg by mouth daily.   Yes [provider]  Melatonin 10 MG TABS Take 10 mg by mouth at bedtime.   Yes [provider]  metoprolol tartrate (LOPRESSOR) 50 MG tablet Take 50 mg by mouth 2 (two) times daily.   Yes [provider]  primidone (MYSOLINE) 250 MG tablet Take 1 tablet (250 mg total) by mouth 4 (four) times daily. 11/19/18  Yes Rinehuls, Early Chars, PA-C  QUEtiapine (SEROQUEL) 300 MG tablet Take 750 mg by mouth at bedtime.   Yes [provider]  traMADol-acetaminophen (ULTRACET) 37.5-325 MG tablet Take 1 tablet by mouth 2 (two) times daily.    Yes [provider]    Physical Exam: Vitals:   01/10/21 1720 01/10/21 1730 01/10/21 1934 01/10/21 2131  BP:  (!) 115/56 132/68 (!) 150/77  Pulse: 94 93 93 97  Resp: (!) 24 (!) 24 (!) 21   Temp: 98 F (36.7 C)  98 F (36.7 C)   TempSrc: Oral  Oral   SpO2: 96% 98% 99%     Constitutional: Lethargic arousable and oriented x3, no associated distress.   Skin: no rashes, no lesions, poor skin turgor noted. Eyes: Pupils are equally reactive to light.  No evidence of scleral icterus or conjunctival pallor.  ENMT: Dry mucous membranes noted.  Posterior pharynx  clear of any exudate or lesions.   Neck: normal, supple, no masses, no thyromegaly.  No evidence of jugular venous distension.   Respiratory: clear to auscultation bilaterally, no wheezing, no crackles. Normal respiratory effort. No accessory muscle use.  Cardiovascular: Regular rate and rhythm, no murmurs / rubs / gallops. No extremity edema. 2+ pedal pulses. No carotid bruits.  Chest:   Nontender without crepitus or deformity.   Back:   Nontender without crepitus or deformity. Abdomen: Notable lower abdominal tenderness.  Abdomen is soft however.  No evidence of intra-abdominal masses.  Positive bowel sounds noted in all quadrants.   Musculoskeletal: No joint deformity upper and lower extremities. Good ROM, no contractures. Normal muscle tone.  Neurologic: CN 2-12 grossly intact. Sensation intact.  Patient moving all 4 extremities spontaneously.  Patient is following all commands.  Patient is responsive to verbal stimuli.   Psychiatric: Patient exhibits normal mood with flat affect.  Patient seems to possess insight as to their current situation.     Labs  on Admission: I have personally reviewed following labs and imaging studies -   CBC: Recent Labs  Lab 01/10/21 1637 01/10/21 1642  WBC 11.5*  --   NEUTROABS 9.0*  --   HGB 12.5* 11.2*  HCT 37.4* 33.0*  MCV 94.4  --   PLT 195  --    Basic Metabolic Panel: Recent Labs  Lab 01/10/21 1637 01/10/21 1642  NA 134* 136  K 4.0 4.0  CL 98 99  CO2 25  --   GLUCOSE 119* 116*  BUN 19 20  CREATININE 1.44* 1.40*  CALCIUM 9.0  --    GFR: CrCl cannot be calculated (Unknown ideal weight.). Liver Function Tests: Recent Labs  Lab 01/10/21 1637  AST 54*  ALT 56*  ALKPHOS 91  BILITOT 1.4*  PROT 7.6  ALBUMIN 3.3*   No results for input(s): LIPASE, AMYLASE in the last 168 hours. No results for input(s): AMMONIA in the last 168 hours. Coagulation Profile: No results for input(s): INR, PROTIME in the last 168 hours. Cardiac  Enzymes: No results for input(s): CKTOTAL, CKMB, CKMBINDEX, TROPONINI in the last 168 hours. BNP (last 3 results) No results for input(s): PROBNP in the last 8760 hours. HbA1C: No results for input(s): HGBA1C in the last 72 hours. CBG: No results for input(s): GLUCAP in the last 168 hours. Lipid Profile: No results for input(s): CHOL, HDL, LDLCALC, TRIG, CHOLHDL, LDLDIRECT in the last 72 hours. Thyroid Function Tests: No results for input(s): TSH, T4TOTAL, FREET4, T3FREE, THYROIDAB in the last 72 hours. Anemia Panel: No results for input(s): VITAMINB12, FOLATE, FERRITIN, TIBC, IRON, RETICCTPCT in the last 72 hours. Urine analysis:    Component Value Date/Time   COLORURINE AMBER (A) 01/10/2021 1606   APPEARANCEUR CLOUDY (A) 01/10/2021 1606   LABSPEC 1.020 01/10/2021 1606   PHURINE 5.0 01/10/2021 1606   GLUCOSEU NEGATIVE 01/10/2021 1606   HGBUR MODERATE (A) 01/10/2021 1606   BILIRUBINUR NEGATIVE 01/10/2021 [place] 01/10/2021 1606   PROTEINUR 100 (A) 01/10/2021 1606   NITRITE POSITIVE (A) 01/10/2021 1606   LEUKOCYTESUR LARGE (A) 01/10/2021 1606    Radiological Exams on Admission - Personally Reviewed: CT Head Wo Contrast  Result Date: 01/10/2021 CLINICAL DATA:  Neck trauma. Headache, intracranial hemorrhage suspected. Status post fall 2-3 times every day for 2 weeks. EXAM: CT HEAD WITHOUT CONTRAST CT CERVICAL SPINE WITHOUT CONTRAST TECHNIQUE: Multidetector CT imaging of the head and cervical spine was performed following the standard protocol without intravenous contrast. Multiplanar CT image reconstructions of the cervical spine were also generated. COMPARISON:  CT angio head neck 11/18/2018. FINDINGS: CT HEAD FINDINGS BRAIN: BRAIN Cerebral ventricle sizes are concordant with the degree of cerebral volume loss. Patchy and confluent areas of decreased attenuation are noted throughout the deep and periventricular white matter of the cerebral hemispheres bilaterally,  compatible with chronic microvascular ischemic disease. No evidence of large-territorial acute infarction. No parenchymal hemorrhage. No mass lesion. No extra-axial collection. No mass effect or midline shift. No hydrocephalus. Basilar cisterns are patent. Vascular: No hyperdense vessel. Skull: No acute fracture or focal lesion. Atherosclerotic calcifications are present within the cavernous internal carotid arteries. Sinuses/Orbits: Almost complete opacification of bilateral maxillary sinuses. Associated maxillary wall mild hyperostosis. Remaining visualized paranasal sinuses and mastoid air cells are clear. Bilateral lens replacement. Otherwise orbits are unremarkable. Other: None. CT CERVICAL SPINE FINDINGS Alignment: Normal. Skull base and vertebrae: Multilevel degenerative changes of the spine. Associated severe osseous neural foraminal stenosis at the right C5-C6 level. As well as  left C6-C7 level. No severe osseous central canal stenosis. No acute fracture. No aggressive appearing focal osseous lesion or focal pathologic process. Soft tissues and spinal canal: No prevertebral fluid or swelling. No visible canal hematoma. Upper chest: Unremarkable. Other: None. IMPRESSION: 1. No acute intracranial abnormality. 2. No acute displaced fracture or traumatic listhesis of the cervical spine. 3. Multilevel degenerative change of the spine with associated severe osseous neural foraminal stenosis of the right C5-C6 and left C6-C7 levels. Electronically Signed   By: Iven Finn M.D.   On: 01/10/2021 16:46   CT Cervical Spine Wo Contrast  Result Date: 01/10/2021 CLINICAL DATA:  Neck trauma. Headache, intracranial hemorrhage suspected. Status post fall 2-3 times every day for 2 weeks. EXAM: CT HEAD WITHOUT CONTRAST CT CERVICAL SPINE WITHOUT CONTRAST TECHNIQUE: Multidetector CT imaging of the head and cervical spine was performed following the standard protocol without intravenous contrast. Multiplanar CT image  reconstructions of the cervical spine were also generated. COMPARISON:  CT angio head neck 11/18/2018. FINDINGS: CT HEAD FINDINGS BRAIN: BRAIN Cerebral ventricle sizes are concordant with the degree of cerebral volume loss. Patchy and confluent areas of decreased attenuation are noted throughout the deep and periventricular white matter of the cerebral hemispheres bilaterally, compatible with chronic microvascular ischemic disease. No evidence of large-territorial acute infarction. No parenchymal hemorrhage. No mass lesion. No extra-axial collection. No mass effect or midline shift. No hydrocephalus. Basilar cisterns are patent. Vascular: No hyperdense vessel. Skull: No acute fracture or focal lesion. Atherosclerotic calcifications are present within the cavernous internal carotid arteries. Sinuses/Orbits: Almost complete opacification of bilateral maxillary sinuses. Associated maxillary wall mild hyperostosis. Remaining visualized paranasal sinuses and mastoid air cells are clear. Bilateral lens replacement. Otherwise orbits are unremarkable. Other: None. CT CERVICAL SPINE FINDINGS Alignment: Normal. Skull base and vertebrae: Multilevel degenerative changes of the spine. Associated severe osseous neural foraminal stenosis at the right C5-C6 level. As well as left C6-C7 level. No severe osseous central canal stenosis. No acute fracture. No aggressive appearing focal osseous lesion or focal pathologic process. Soft tissues and spinal canal: No prevertebral fluid or swelling. No visible canal hematoma. Upper chest: Unremarkable. Other: None. IMPRESSION: 1. No acute intracranial abnormality. 2. No acute displaced fracture or traumatic listhesis of the cervical spine. 3. Multilevel degenerative change of the spine with associated severe osseous neural foraminal stenosis of the right C5-C6 and left C6-C7 levels. Electronically Signed   By: Iven Finn M.D.   On: 01/10/2021 16:46   CT CHEST ABDOMEN PELVIS W  CONTRAST  Result Date: 01/10/2021 CLINICAL DATA:  Chest trauma, mod-severe, Low back pain, trauma, Chest discomfort EXAM: CT CHEST, ABDOMEN, AND PELVIS WITH CONTRAST TECHNIQUE: Multidetector CT imaging of the chest, abdomen and pelvis was performed following the standard protocol during bolus administration of intravenous contrast. CONTRAST:  181mL OMNIPAQUE IOHEXOL 300 MG/ML  SOLN COMPARISON:  None. FINDINGS: CHEST: Ports and Devices: None. Lungs/airways: Mild reticulations along the peripheral right lung and right lower lobe. No focal consolidation. Scattered punctate calcifications throughout the lungs. Right apical 8 mm ground-glass nodule (5:43). No pulmonary mass. No pulmonary contusion or laceration. No pneumatocele formation. The central airways are patent. Mild right lower lobe bronchial wall thickening. Pleura: No pleural effusion. No pneumothorax. No hemothorax. Lymph Nodes: No mediastinal, hilar, or axillary lymphadenopathy. Mediastinum: No pneumomediastinum. No aortic injury or mediastinal hematoma. The thoracic aorta is normal in caliber. At least mild calcified noncalcified atherosclerotic plaque. At least 3 vessel coronary artery calcifications. The heart is normal in size. No  significant pericardial effusion. The esophagus is unremarkable. The thyroid is unremarkable. Chest Wall / Breasts: No chest wall mass. Musculoskeletal: No acute rib or sternal fracture. No spinal fracture. ABDOMEN / PELVIS: Liver: Not enlarged. No focal lesion. No laceration or subcapsular hematoma. Biliary System: The gallbladder is otherwise unremarkable with no radio-opaque gallstones. No biliary ductal dilatation. Pancreas: Normal pancreatic contour. No main pancreatic duct dilatation. Spleen: Couple splenules are noted. Not enlarged. No focal lesion. No laceration, subcapsular hematoma, or vascular injury. Adrenal Glands: No nodularity bilaterally. Kidneys: Bilateral kidneys enhance symmetrically. Subcentimeter  hypodensities are too small characterize. No hydronephrosis. No contusion, laceration, or subcapsular hematoma. No injury to the vascular structures or collecting systems. No hydroureter. Circumferential urinary bladder wall thickening likely due to under distension. The urinary bladder is unremarkable. On delayed imaging, there is no urothelial wall thickening and there are no filling defects in the opacified portions of the bilateral collecting systems or ureters. Bowel: No small or large bowel wall thickening or dilatation. The appendix is unremarkable. Mesentery, Omentum, and Peritoneum: No simple free fluid ascites. No pneumoperitoneum. No hemoperitoneum. No mesenteric hematoma identified. No organized fluid collection. Pelvic Organs: Normal. Lymph Nodes: No abdominal, pelvic, inguinal lymphadenopathy. Vasculature: Moderate to severe atherosclerotic plaque. No abdominal aorta or iliac aneurysm. No active contrast extravasation or pseudoaneurysm. Musculoskeletal: No significant soft tissue hematoma. No acute pelvic fracture. No spinal fracture. Intramedullary nail fixation of the visualized proximal right femur. Intervertebral disc space vacuum phenomenon at the L5-S1 level. Posterior disc osteophyte complex at the L5-S1 level. IMPRESSION: 1. No acute traumatic injury to the chest, abdomen, or pelvis. 2. No acute fracture or traumatic malalignment of the thoracic or lumbar spine. 3. Sequelae of prior granulomatous disease with associated trace pulmonary fibrosis. 4. Right apical 8 mm ground-glass nodule. Initial follow-up with CT at 6-12 months is recommended to confirm persistence. If persistent, repeat CT is recommended every 2 years until 5 years of stability has been established. This recommendation follows the consensus statement: Guidelines for Management of Incidental Pulmonary Nodules Detected on CT Images: From the Fleischner Society 2017; Radiology 2017; 284:228-243. 5. Aortic Atherosclerosis  (ICD10-I70.0). Electronically Signed   By: Iven Finn M.D.   On: 01/10/2021 18:38    EKG: Personally reviewed.  Rhythm is sinus tachycardia with heart rate of 101 bpm.  No dynamic ST segment changes appreciated.  Assessment/Plan  * Acute cystitis without hematuria Presence of 1 week history of dysuria with lower abdominal tenderness on exam  Urinalysis suggestive of urinary tract infection Patient exhibiting multiple SIRS criteria however no evidence of organ dysfunction to suggest sepsis. Urinary tract infection felt to be the cause of patient's generalized weakness and frequent falls as of late Currently treating with intravenous ceftriaxone Hydrating gently with intravenous isotonic fluids Blood and urine cultures have been obtained CT imaging of abdomen pelvis revealed no evidence of pyelonephritis  SIRS (systemic inflammatory response syndrome) ([place]) Patient exhibiting multiple SIRS criteria however no evidence of organ dysfunction to suggest sepsis Treating for underlying infection as noted above Monitoring closely  Fall at home, initial encounter Patient exhibiting frequent falls at home likely secondary to progressive weakness from underlying urinary tract infection Treating underlying infection as noted above PT evaluation ordered for the morning If weakness and unsteady gait fail to improve will consider MRI imaging of the brain  Coronary artery disease involving native coronary artery of native heart without angina pectoris Patient is currently chest pain free Monitoring patient on telemetry Continue home regimen of antiplatelet therapy,  lipid lowering therapy and AV nodal blocking therapy   Hypothyroidism Resume home regimen of Synthroid    Mixed hyperlipidemia Continuing home regimen of lipid lowering therapy.   GERD without esophagitis No PPI or H2 blocker seen on home meds.  Will place on Protonix.      Code Status:  DNR  code status decision has  been confirmed with: Patient Family Communication: Deferred  Status is: Observation  The patient remains OBS appropriate and will d/c before 2 midnights.       Vernelle Emerald MD [facility] Pager [phone]  If 7PM-7AM, please contact night-coverage [email or web address] Use universal [place] password for that web site. If you do not have the password, please call the hospital operator.  01/10/2021, 10:29 PM

## 2021-01-10 NOTE — Assessment & Plan Note (Signed)
.   Continuing home regimen of lipid lowering therapy.  

## 2021-01-10 NOTE — ED Triage Notes (Signed)
Pt reports falling 2-3 times every day x 2 weeks. Pt fell today and hit head. Pt is on Plavix. Reports he gets "swimmy headed" and falls. Family reports has had a fever since he got his covid vaccine on Monday.

## 2021-01-10 NOTE — Progress Notes (Signed)
Pharmacy Antibiotic Note  Derek Vargas is a 70 y.o. male admitted on 01/10/2021 with sepsis.  Pharmacy has been consulted for cefepime and vancomycin dosing.  Patient with a history of arthritis, Crohn's disease, HLD, HTN, [place] Humbles, STEMI. Patient presenting with fall and fever.  SCr 1.4 which is above baseline WBC 11.5; LA 1.2; T 101.2 F  Plan: Flagyl per MD Cefepime 2 g q12hr Vancomycin 2000 mg once, subsequent dosing as indicated per random vancomycin level until renal function stable and/or improved, at which time scheduled dosing can be considered Follow-up cultures and de-escalate antibiotics as appropriate.     Temp (24hrs), Avg:99.6 F (37.6 C), Min:98 F (36.7 C), Max:101.2 F (38.4 C)  Recent Labs  Lab 01/10/21 1605 01/10/21 1637 01/10/21 1642  WBC  --  11.5*  --   CREATININE  --  1.44* 1.40*  LATICACIDVEN 1.2  --   --     CrCl cannot be calculated (Unknown ideal weight.).    Allergies  Allergen Reactions   Codeine Nausea And Vomiting   Antimicrobials this admission: metronidazole 10/21 >>  vancomycin 10/21 >>  cefepime 10/21 >>   Microbiology results: Pending  Thank you for allowing pharmacy to be a part of this patient's care.  Lorelei Pont, PharmD, BCPS 01/10/2021 7:26 PM ED Clinical Pharmacist -  [phone]

## 2021-01-10 NOTE — Assessment & Plan Note (Addendum)
   Presence of 1 week history of dysuria with lower abdominal tenderness on exam   Urinalysis suggestive of urinary tract infection  Patient exhibiting multiple SIRS criteria however no evidence of organ dysfunction to suggest sepsis.  Urinary tract infection felt to be the cause of patient's generalized weakness and frequent falls as of late  Currently treating with intravenous ceftriaxone  Hydrating gently with intravenous isotonic fluids  Blood and urine cultures have been obtained  CT imaging of abdomen pelvis revealed no evidence of pyelonephritis

## 2021-01-10 NOTE — Assessment & Plan Note (Signed)
   Patient exhibiting multiple SIRS criteria however no evidence of organ dysfunction to suggest sepsis  Treating for underlying infection as noted above  Monitoring closely

## 2021-01-10 NOTE — Assessment & Plan Note (Signed)
•   Patient is currently chest pain free °• Monitoring patient on telemetry °• Continue home regimen of antiplatelet therapy, lipid lowering therapy and AV nodal blocking therapy ° °

## 2021-01-10 NOTE — ED Provider Notes (Signed)
[facility] EMERGENCY DEPARTMENT Provider Note   CSN: [id number] Arrival date & time: 01/10/21  1532     History Chief Complaint  Patient presents with   Fall   Fever    Derek Vargas is a 70 y.o. male.  Presents to the emergency room with concern for fall.  Patient reports over the past week or 2 he has been feeling dizzy, having frequent episodes of falling.  Has been falling a couple times daily.  This morning he fell and hit his head.  Did not lose consciousness.  Went to outpatient clinic today and was told he had a fever and needed to go to the hospital for further evaluation.  He did have a recent COVID-vaccine on Monday.  He denies any pain at present.  No dysuria or hematuria.  No chest pain.  Has had mild nonproductive cough.    HPI     Past Medical History:  Diagnosis Date   Alzheimer's disease ([place])    BORDERLINE per wife   Arthritis    Coronary artery disease involving native coronary artery of native heart without angina pectoris 01/10/2021   Crohn disease ([place])    GERD without esophagitis 01/10/2021   Hyperlipidemia    Hypertension    Hypothyroidism 01/10/2021   [name] disease [facility])    Mixed hyperlipidemia 01/10/2021   STEMI (ST elevation myocardial infarction) [facility]) 2010    Patient Active Problem List   Diagnosis Date Noted   SIRS (systemic inflammatory response syndrome) ([place]) 01/10/2021   Acute cystitis without hematuria 01/10/2021   Coronary artery disease involving native coronary artery of native heart without angina pectoris 01/10/2021   Hypothyroidism 01/10/2021   Mixed hyperlipidemia 01/10/2021   GERD without esophagitis 01/10/2021   Stroke (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       Family History  Problem Relation Age of Onset   Heart attack Neg Hx     Social History   Tobacco Use   Smoking status: Former    Types: Cigarettes    Smokeless tobacco: Never  Substance Use Topics   Alcohol use: Never   Drug use: Never    Home Medications Prior to Admission medications   Medication Sig Start Date End Date Taking? Authorizing Provider  aspirin EC 81 MG tablet Take 81 mg by mouth daily.   Yes [provider]  atorvastatin (LIPITOR) 80 MG tablet Take 80 mg by mouth daily.   Yes [provider]  baclofen (LIORESAL) 10 MG tablet Take 5-10 mg by mouth 3 (three) times daily as needed for pain. 12/26/20  Yes [provider]  buPROPion (WELLBUTRIN XL) 150 MG 24 hr tablet Take 150 mg by mouth daily.   Yes [provider]  clonazePAM (KLONOPIN) 1 MG tablet Take 1 mg by mouth daily. 01/29/14  Yes [provider]  clopidogrel (PLAVIX) 75 MG tablet Take 1 tablet (75 mg total) by mouth daily. 11/20/18  Yes Rinehuls, Early Chars, PA-C  doxepin (SINEQUAN) 100 MG capsule Take 100 mg by mouth at bedtime. 10/05/18  Yes [provider]  FLUoxetine (PROZAC) 40 MG capsule Take 40 mg by mouth daily.   Yes [provider]  levothyroxine (SYNTHROID) 25 MCG tablet Take 25 mcg by mouth daily before breakfast.   Yes [provider]  lisinopril (ZESTRIL) 5 MG tablet Take 5 mg by mouth daily.   Yes  [provider]  Melatonin 10 MG TABS Take 10 mg by mouth at bedtime.   Yes [provider]  metoprolol tartrate (LOPRESSOR) 50 MG tablet Take 50 mg by mouth 2 (two) times daily.   Yes [provider]  primidone (MYSOLINE) 250 MG tablet Take 1 tablet (250 mg total) by mouth 4 (four) times daily. 11/19/18  Yes Rinehuls, Early Chars, PA-C  QUEtiapine (SEROQUEL) 300 MG tablet Take 750 mg by mouth at bedtime.   Yes [provider]  traMADol-acetaminophen (ULTRACET) 37.5-325 MG tablet Take 1 tablet by mouth 2 (two) times daily.    Yes [provider]   Allergies    Codeine  Review of Systems   Review of Systems  Constitutional:  Positive for fatigue and  fever. Negative for chills.  HENT:  Negative for ear pain and sore throat.   Eyes:  Negative for pain and visual disturbance.  Respiratory:  Positive for cough. Negative for shortness of breath.   Cardiovascular:  Negative for chest pain and palpitations.  Gastrointestinal:  Negative for abdominal pain and vomiting.  Genitourinary:  Negative for dysuria and hematuria.  Musculoskeletal:  Negative for arthralgias and back pain.  Skin:  Negative for color change and rash.  Neurological:  Positive for dizziness, weakness and light-headedness. Negative for seizures and syncope.  All other systems reviewed and are negative.  Physical Exam Updated Vital Signs BP (!) 150/77   Pulse 97   Temp 98 F (36.7 C) (Oral)   Resp (!) 21   SpO2 99%   Physical Exam Vitals and nursing note reviewed.  Constitutional:      Appearance: He is well-developed.  HENT:     Head: Normocephalic and atraumatic.  Eyes:     Conjunctiva/sclera: Conjunctivae normal.  Cardiovascular:     Rate and Rhythm: Normal rate and regular rhythm.     Heart sounds: No murmur heard. Pulmonary:     Effort: Pulmonary effort is normal. No respiratory distress.     Breath sounds: Normal breath sounds.  Abdominal:     Palpations: Abdomen is soft.     Tenderness: There is no abdominal tenderness.  Musculoskeletal:     Cervical back: Neck supple.     Comments: Scattered ecchymosis noted over thoracic and lumbar regions, no step-off or deformity  Skin:    General: Skin is warm and dry.  Neurological:     Mental Status: He is alert.     Comments: Alert, oriented x 3, moving all 4 extremities equally, sensation intact in all 4 extremities  Psychiatric:        Mood and Affect: Mood normal.    ED Results / Procedures / Treatments   Labs (all labs ordered are listed, but only abnormal results are displayed) Labs Reviewed  CBC WITH DIFFERENTIAL/PLATELET - Abnormal; Notable for the following components:      Result Value    WBC 11.5 (*)    RBC 3.96 (*)    Hemoglobin 12.5 (*)    HCT 37.4 (*)    Neutro Abs 9.0 (*)    Monocytes Absolute 1.5 (*)    All other components within normal limits  COMPREHENSIVE METABOLIC PANEL - Abnormal; Notable for the following components:   Sodium 134 (*)    Glucose, Bld 119 (*)    Creatinine, Ser 1.44 (*)    Albumin 3.3 (*)    AST 54 (*)    ALT 56 (*)    Total Bilirubin 1.4 (*)  GFR, Estimated 53 (*)    All other components within normal limits  URINALYSIS, ROUTINE W REFLEX MICROSCOPIC - Abnormal; Notable for the following components:   Color, Urine AMBER (*)    APPearance CLOUDY (*)    Hgb urine dipstick MODERATE (*)    Protein, ur 100 (*)    Nitrite POSITIVE (*)    Leukocytes,Ua LARGE (*)    WBC, UA >50 (*)    Bacteria, UA MANY (*)    All other components within normal limits  I-STAT CHEM 8, ED - Abnormal; Notable for the following components:   Creatinine, Ser 1.40 (*)    Glucose, Bld 116 (*)    Calcium, Ion 1.06 (*)    Hemoglobin 11.2 (*)    HCT 33.0 (*)    All other components within normal limits  TROPONIN I (HIGH SENSITIVITY) - Abnormal; Notable for the following components:   Troponin I (High Sensitivity) 27 (*)    All other components within normal limits  TROPONIN I (HIGH SENSITIVITY) - Abnormal; Notable for the following components:   Troponin I (High Sensitivity) 29 (*)    All other components within normal limits  RESP PANEL BY RT-PCR (FLU A&B, COVID) ARPGX2  CULTURE, BLOOD (ROUTINE X 2)  CULTURE, BLOOD (ROUTINE X 2)  URINE CULTURE  LACTIC ACID, PLASMA  LACTIC ACID, PLASMA  PROTIME-INR  HIV ANTIBODY (ROUTINE TESTING W REFLEX)  PROTIME-INR  APTT  COMPREHENSIVE METABOLIC PANEL  CBC WITH DIFFERENTIAL/PLATELET  MAGNESIUM    EKG EKG Interpretation  Date/Time:  Friday January 10 2021 16:51:16 EDT Ventricular Rate:  101 PR Interval:  155 QRS Duration: 97 QT Interval:  345 QTC Calculation: 448 R Axis:   12 Text Interpretation: Sinus  tachycardia Probable left atrial enlargement Confirmed by Madalyn Rob [phone]) on 01/10/2021 5:35:47 PM  Radiology CT Head Wo Contrast  Result Date: 01/10/2021 CLINICAL DATA:  Neck trauma. Headache, intracranial hemorrhage suspected. Status post fall 2-3 times every day for 2 weeks. EXAM: CT HEAD WITHOUT CONTRAST CT CERVICAL SPINE WITHOUT CONTRAST TECHNIQUE: Multidetector CT imaging of the head and cervical spine was performed following the standard protocol without intravenous contrast. Multiplanar CT image reconstructions of the cervical spine were also generated. COMPARISON:  CT angio head neck 11/18/2018. FINDINGS: CT HEAD FINDINGS BRAIN: BRAIN Cerebral ventricle sizes are concordant with the degree of cerebral volume loss. Patchy and confluent areas of decreased attenuation are noted throughout the deep and periventricular white matter of the cerebral hemispheres bilaterally, compatible with chronic microvascular ischemic disease. No evidence of large-territorial acute infarction. No parenchymal hemorrhage. No mass lesion. No extra-axial collection. No mass effect or midline shift. No hydrocephalus. Basilar cisterns are patent. Vascular: No hyperdense vessel. Skull: No acute fracture or focal lesion. Atherosclerotic calcifications are present within the cavernous internal carotid arteries. Sinuses/Orbits: Almost complete opacification of bilateral maxillary sinuses. Associated maxillary wall mild hyperostosis. Remaining visualized paranasal sinuses and mastoid air cells are clear. Bilateral lens replacement. Otherwise orbits are unremarkable. Other: None. CT CERVICAL SPINE FINDINGS Alignment: Normal. Skull base and vertebrae: Multilevel degenerative changes of the spine. Associated severe osseous neural foraminal stenosis at the right C5-C6 level. As well as left C6-C7 level. No severe osseous central canal stenosis. No acute fracture. No aggressive appearing focal osseous lesion or focal pathologic  process. Soft tissues and spinal canal: No prevertebral fluid or swelling. No visible canal hematoma. Upper chest: Unremarkable. Other: None. IMPRESSION: 1. No acute intracranial abnormality. 2. No acute displaced fracture or traumatic listhesis of the cervical  spine. 3. Multilevel degenerative change of the spine with associated severe osseous neural foraminal stenosis of the right C5-C6 and left C6-C7 levels. Electronically Signed   By: Iven Finn M.D.   On: 01/10/2021 16:46   CT Cervical Spine Wo Contrast  Result Date: 01/10/2021 CLINICAL DATA:  Neck trauma. Headache, intracranial hemorrhage suspected. Status post fall 2-3 times every day for 2 weeks. EXAM: CT HEAD WITHOUT CONTRAST CT CERVICAL SPINE WITHOUT CONTRAST TECHNIQUE: Multidetector CT imaging of the head and cervical spine was performed following the standard protocol without intravenous contrast. Multiplanar CT image reconstructions of the cervical spine were also generated. COMPARISON:  CT angio head neck 11/18/2018. FINDINGS: CT HEAD FINDINGS BRAIN: BRAIN Cerebral ventricle sizes are concordant with the degree of cerebral volume loss. Patchy and confluent areas of decreased attenuation are noted throughout the deep and periventricular white matter of the cerebral hemispheres bilaterally, compatible with chronic microvascular ischemic disease. No evidence of large-territorial acute infarction. No parenchymal hemorrhage. No mass lesion. No extra-axial collection. No mass effect or midline shift. No hydrocephalus. Basilar cisterns are patent. Vascular: No hyperdense vessel. Skull: No acute fracture or focal lesion. Atherosclerotic calcifications are present within the cavernous internal carotid arteries. Sinuses/Orbits: Almost complete opacification of bilateral maxillary sinuses. Associated maxillary wall mild hyperostosis. Remaining visualized paranasal sinuses and mastoid air cells are clear. Bilateral lens replacement. Otherwise orbits are  unremarkable. Other: None. CT CERVICAL SPINE FINDINGS Alignment: Normal. Skull base and vertebrae: Multilevel degenerative changes of the spine. Associated severe osseous neural foraminal stenosis at the right C5-C6 level. As well as left C6-C7 level. No severe osseous central canal stenosis. No acute fracture. No aggressive appearing focal osseous lesion or focal pathologic process. Soft tissues and spinal canal: No prevertebral fluid or swelling. No visible canal hematoma. Upper chest: Unremarkable. Other: None. IMPRESSION: 1. No acute intracranial abnormality. 2. No acute displaced fracture or traumatic listhesis of the cervical spine. 3. Multilevel degenerative change of the spine with associated severe osseous neural foraminal stenosis of the right C5-C6 and left C6-C7 levels. Electronically Signed   By: Iven Finn M.D.   On: 01/10/2021 16:46   CT CHEST ABDOMEN PELVIS W CONTRAST  Result Date: 01/10/2021 CLINICAL DATA:  Chest trauma, mod-severe, Low back pain, trauma, Chest discomfort EXAM: CT CHEST, ABDOMEN, AND PELVIS WITH CONTRAST TECHNIQUE: Multidetector CT imaging of the chest, abdomen and pelvis was performed following the standard protocol during bolus administration of intravenous contrast. CONTRAST:  184mL OMNIPAQUE IOHEXOL 300 MG/ML  SOLN COMPARISON:  None. FINDINGS: CHEST: Ports and Devices: None. Lungs/airways: Mild reticulations along the peripheral right lung and right lower lobe. No focal consolidation. Scattered punctate calcifications throughout the lungs. Right apical 8 mm ground-glass nodule (5:43). No pulmonary mass. No pulmonary contusion or laceration. No pneumatocele formation. The central airways are patent. Mild right lower lobe bronchial wall thickening. Pleura: No pleural effusion. No pneumothorax. No hemothorax. Lymph Nodes: No mediastinal, hilar, or axillary lymphadenopathy. Mediastinum: No pneumomediastinum. No aortic injury or mediastinal hematoma. The thoracic aorta is  normal in caliber. At least mild calcified noncalcified atherosclerotic plaque. At least 3 vessel coronary artery calcifications. The heart is normal in size. No significant pericardial effusion. The esophagus is unremarkable. The thyroid is unremarkable. Chest Wall / Breasts: No chest wall mass. Musculoskeletal: No acute rib or sternal fracture. No spinal fracture. ABDOMEN / PELVIS: Liver: Not enlarged. No focal lesion. No laceration or subcapsular hematoma. Biliary System: The gallbladder is otherwise unremarkable with no radio-opaque gallstones. No biliary ductal dilatation.  Pancreas: Normal pancreatic contour. No main pancreatic duct dilatation. Spleen: Couple splenules are noted. Not enlarged. No focal lesion. No laceration, subcapsular hematoma, or vascular injury. Adrenal Glands: No nodularity bilaterally. Kidneys: Bilateral kidneys enhance symmetrically. Subcentimeter hypodensities are too small characterize. No hydronephrosis. No contusion, laceration, or subcapsular hematoma. No injury to the vascular structures or collecting systems. No hydroureter. Circumferential urinary bladder wall thickening likely due to under distension. The urinary bladder is unremarkable. On delayed imaging, there is no urothelial wall thickening and there are no filling defects in the opacified portions of the bilateral collecting systems or ureters. Bowel: No small or large bowel wall thickening or dilatation. The appendix is unremarkable. Mesentery, Omentum, and Peritoneum: No simple free fluid ascites. No pneumoperitoneum. No hemoperitoneum. No mesenteric hematoma identified. No organized fluid collection. Pelvic Organs: Normal. Lymph Nodes: No abdominal, pelvic, inguinal lymphadenopathy. Vasculature: Moderate to severe atherosclerotic plaque. No abdominal aorta or iliac aneurysm. No active contrast extravasation or pseudoaneurysm. Musculoskeletal: No significant soft tissue hematoma. No acute pelvic fracture. No spinal  fracture. Intramedullary nail fixation of the visualized proximal right femur. Intervertebral disc space vacuum phenomenon at the L5-S1 level. Posterior disc osteophyte complex at the L5-S1 level. IMPRESSION: 1. No acute traumatic injury to the chest, abdomen, or pelvis. 2. No acute fracture or traumatic malalignment of the thoracic or lumbar spine. 3. Sequelae of prior granulomatous disease with associated trace pulmonary fibrosis. 4. Right apical 8 mm ground-glass nodule. Initial follow-up with CT at 6-12 months is recommended to confirm persistence. If persistent, repeat CT is recommended every 2 years until 5 years of stability has been established. This recommendation follows the consensus statement: Guidelines for Management of Incidental Pulmonary Nodules Detected on CT Images: From the Fleischner Society 2017; Radiology 2017; 284:228-243. 5. Aortic Atherosclerosis (ICD10-I70.0). Electronically Signed   By: Iven Finn M.D.   On: 01/10/2021 18:38    Procedures Procedures   Medications Ordered in ED Medications  aspirin EC tablet 81 mg (81 mg Oral Given 01/10/21 2131)  atorvastatin (LIPITOR) tablet 80 mg (has no administration in time range)  lisinopril (ZESTRIL) tablet 5 mg (has no administration in time range)  metoprolol tartrate (LOPRESSOR) tablet 50 mg (50 mg Oral Given 01/10/21 2131)  doxepin (SINEQUAN) capsule 100 mg (has no administration in time range)  FLUoxetine (PROZAC) capsule 40 mg (has no administration in time range)  QUEtiapine (SEROQUEL) tablet 750 mg (has no administration in time range)  levothyroxine (SYNTHROID) tablet 25 mcg (has no administration in time range)  clopidogrel (PLAVIX) tablet 75 mg (has no administration in time range)  melatonin tablet 10 mg (has no administration in time range)  clonazePAM (KLONOPIN) tablet 1 mg (1 mg Oral Given 01/10/21 2131)  primidone (MYSOLINE) tablet 250 mg (has no administration in time range)  enoxaparin (LOVENOX) injection  40 mg (has no administration in time range)  cefTRIAXone (ROCEPHIN) 1 g in sodium chloride 0.9 % 100 mL IVPB (has no administration in time range)  acetaminophen (TYLENOL) tablet 650 mg (has no administration in time range)    Or  acetaminophen (TYLENOL) suppository 650 mg (has no administration in time range)  polyethylene glycol (MIRALAX / GLYCOLAX) packet 17 g (has no administration in time range)  ondansetron (ZOFRAN) tablet 4 mg (has no administration in time range)    Or  ondansetron (ZOFRAN) injection 4 mg (has no administration in time range)  0.9 %  sodium chloride infusion ( Intravenous New Bag/Given 01/10/21 2135)  acetaminophen (TYLENOL) tablet 1,000 mg (1,000 mg  Oral Given 01/10/21 1646)  lactated ringers bolus 1,000 mL (0 mLs Intravenous Stopped 01/10/21 1725)  ceFEPIme (MAXIPIME) 2 g in sodium chloride 0.9 % 100 mL IVPB (0 g Intravenous Stopped 01/10/21 1717)  metroNIDAZOLE (FLAGYL) IVPB 500 mg (0 mg Intravenous Stopped 01/10/21 1745)  vancomycin (VANCOREADY) IVPB 2000 mg/400 mL (0 mg Intravenous Stopped 01/10/21 1919)  iohexol (OMNIPAQUE) 300 MG/ML solution 100 mL (100 mLs Intravenous Contrast Given 01/10/21 1758)    ED Course  I have reviewed the triage vital signs and the nursing notes.  Pertinent labs & imaging results that were available during my care of the patient were reviewed by me and considered in my medical decision making (see chart for details).    MDM Rules/Calculators/A&P                           70 year old male presenting to ER with concern for frequent falls, fever.  He endorsed hitting head, on Plavix, initially trauma alert.  Noted to be febrile, tachycardic.  Concern for possibility of sepsis and ordered broad infectious work-up in addition to his trauma imaging.  Check CT head, C-spine and chest abdomen pelvis due to the scattered ecchymosis over his thoracic and lumbar back.  Lactate normal, did have elevated white blood cell count.  UA with likely  UTI.  Vital signs normalized after receiving his initial fluids and antipyretic.  Suspect symptoms may all be related to underlying urinary tract infection.  Discussed case with Dr. Cyd Silence with the hospitalist service who will admit for further management.    Final Clinical Impression(s) / ED Diagnoses Final diagnoses:  Chest discomfort  Urinary tract infection with hematuria, site unspecified  Leukocytosis, unspecified type  Frequent falls    Rx / DC Orders ED Discharge Orders     None        Lucrezia Starch, MD 01/10/21 2218

## 2021-01-10 NOTE — Assessment & Plan Note (Signed)
.   Resume home regimen of Synthroid 

## 2021-01-10 NOTE — Assessment & Plan Note (Addendum)
   No PPI or H2 blocker seen on home meds.  Will place on Protonix.

## 2021-01-10 NOTE — Assessment & Plan Note (Signed)
   Patient exhibiting frequent falls at home likely secondary to progressive weakness from underlying urinary tract infection  Treating underlying infection as noted above  PT evaluation ordered for the morning  If weakness and unsteady gait fail to improve will consider MRI imaging of the brain

## 2021-01-11 DIAGNOSIS — Z8249 Family history of ischemic heart disease and other diseases of the circulatory system: Secondary | ICD-10-CM | POA: Diagnosis not present

## 2021-01-11 DIAGNOSIS — Z20822 Contact with and (suspected) exposure to covid-19: Secondary | ICD-10-CM | POA: Diagnosis present

## 2021-01-11 DIAGNOSIS — R296 Repeated falls: Secondary | ICD-10-CM | POA: Diagnosis present

## 2021-01-11 DIAGNOSIS — Z885 Allergy status to narcotic agent status: Secondary | ICD-10-CM | POA: Diagnosis not present

## 2021-01-11 DIAGNOSIS — F419 Anxiety disorder, unspecified: Secondary | ICD-10-CM | POA: Diagnosis present

## 2021-01-11 DIAGNOSIS — Z8673 Personal history of transient ischemic attack (TIA), and cerebral infarction without residual deficits: Secondary | ICD-10-CM | POA: Diagnosis not present

## 2021-01-11 DIAGNOSIS — Z79899 Other long term (current) drug therapy: Secondary | ICD-10-CM | POA: Diagnosis not present

## 2021-01-11 DIAGNOSIS — A4151 Sepsis due to Escherichia coli [E. coli]: Secondary | ICD-10-CM | POA: Diagnosis present

## 2021-01-11 DIAGNOSIS — Z66 Do not resuscitate: Secondary | ICD-10-CM | POA: Diagnosis present

## 2021-01-11 DIAGNOSIS — Z7982 Long term (current) use of aspirin: Secondary | ICD-10-CM | POA: Diagnosis not present

## 2021-01-11 DIAGNOSIS — K5090 Crohn's disease, unspecified, without complications: Secondary | ICD-10-CM | POA: Diagnosis present

## 2021-01-11 DIAGNOSIS — G1221 Amyotrophic lateral sclerosis: Secondary | ICD-10-CM | POA: Diagnosis present

## 2021-01-11 DIAGNOSIS — E039 Hypothyroidism, unspecified: Secondary | ICD-10-CM | POA: Diagnosis present

## 2021-01-11 DIAGNOSIS — I252 Old myocardial infarction: Secondary | ICD-10-CM | POA: Diagnosis not present

## 2021-01-11 DIAGNOSIS — E782 Mixed hyperlipidemia: Secondary | ICD-10-CM | POA: Diagnosis present

## 2021-01-11 DIAGNOSIS — G309 Alzheimer's disease, unspecified: Secondary | ICD-10-CM | POA: Diagnosis present

## 2021-01-11 DIAGNOSIS — G250 Essential tremor: Secondary | ICD-10-CM | POA: Diagnosis present

## 2021-01-11 DIAGNOSIS — Z955 Presence of coronary angioplasty implant and graft: Secondary | ICD-10-CM | POA: Diagnosis not present

## 2021-01-11 DIAGNOSIS — N3000 Acute cystitis without hematuria: Secondary | ICD-10-CM | POA: Diagnosis present

## 2021-01-11 DIAGNOSIS — I10 Essential (primary) hypertension: Secondary | ICD-10-CM | POA: Diagnosis present

## 2021-01-11 DIAGNOSIS — F0280 Dementia in other diseases classified elsewhere without behavioral disturbance: Secondary | ICD-10-CM | POA: Diagnosis present

## 2021-01-11 DIAGNOSIS — K219 Gastro-esophageal reflux disease without esophagitis: Secondary | ICD-10-CM | POA: Diagnosis present

## 2021-01-11 DIAGNOSIS — Z87891 Personal history of nicotine dependence: Secondary | ICD-10-CM | POA: Diagnosis not present

## 2021-01-11 DIAGNOSIS — I2510 Atherosclerotic heart disease of native coronary artery without angina pectoris: Secondary | ICD-10-CM | POA: Diagnosis present

## 2021-01-11 DIAGNOSIS — R0789 Other chest pain: Secondary | ICD-10-CM | POA: Diagnosis present

## 2021-01-11 LAB — COMPREHENSIVE METABOLIC PANEL
ALT: 53 U/L — ABNORMAL HIGH (ref 0–44)
AST: 53 U/L — ABNORMAL HIGH (ref 15–41)
Albumin: 2.6 g/dL — ABNORMAL LOW (ref 3.5–5.0)
Alkaline Phosphatase: 74 U/L (ref 38–126)
Anion gap: 5 (ref 5–15)
BUN: 16 mg/dL (ref 8–23)
CO2: 26 mmol/L (ref 22–32)
Calcium: 8.3 mg/dL — ABNORMAL LOW (ref 8.9–10.3)
Chloride: 104 mmol/L (ref 98–111)
Creatinine, Ser: 1.25 mg/dL — ABNORMAL HIGH (ref 0.61–1.24)
GFR, Estimated: >60 mL/min (ref >60)
Glucose, Bld: 106 mg/dL — ABNORMAL HIGH (ref 70–99)
Potassium: 4.4 mmol/L (ref 3.5–5.1)
Sodium: 135 mmol/L (ref 135–145)
Total Bilirubin: 1.1 mg/dL (ref 0.3–1.2)
Total Protein: 6.3 g/dL — ABNORMAL LOW (ref 6.5–8.1)

## 2021-01-11 LAB — CBC WITH DIFFERENTIAL/PLATELET
Abs Immature Granulocytes: 0.05 10*3/uL (ref 0.00–0.07)
Basophils Absolute: 0 10*3/uL (ref 0.0–0.1)
Basophils Relative: 0 %
Eosinophils Absolute: 0.1 10*3/uL (ref 0.0–0.5)
Eosinophils Relative: 1 %
HCT: 34.4 % — ABNORMAL LOW (ref 39.0–52.0)
Hemoglobin: 11.5 g/dL — ABNORMAL LOW (ref 13.0–17.0)
Immature Granulocytes: 1 %
Lymphocytes Relative: 11 %
Lymphs Abs: 1.1 10*3/uL (ref 0.7–4.0)
MCH: 32.3 pg (ref 26.0–34.0)
MCHC: 33.4 g/dL (ref 30.0–36.0)
MCV: 96.6 fL (ref 80.0–100.0)
Monocytes Absolute: 1 10*3/uL (ref 0.1–1.0)
Monocytes Relative: 10 %
Neutro Abs: 7.7 10*3/uL (ref 1.7–7.7)
Neutrophils Relative %: 77 %
Platelets: 167 10*3/uL (ref 150–400)
RBC: 3.56 MIL/uL — ABNORMAL LOW (ref 4.22–5.81)
RDW: 14.8 % (ref 11.5–15.5)
WBC: 9.9 10*3/uL (ref 4.0–10.5)
nRBC: 0 % (ref 0.0–0.2)

## 2021-01-11 LAB — PROTIME-INR
INR: 1.4 — ABNORMAL HIGH (ref 0.8–1.2)
INR: 1.2 (ref 0.8–1.2)
Prothrombin Time: 16.7 seconds — ABNORMAL HIGH (ref 11.4–15.2)
Prothrombin Time: 15.4 seconds — ABNORMAL HIGH (ref 11.4–15.2)

## 2021-01-11 LAB — MAGNESIUM: Magnesium: 1.8 mg/dL (ref 1.7–2.4)

## 2021-01-11 LAB — APTT: aPTT: 37 seconds — ABNORMAL HIGH (ref 24–36)

## 2021-01-11 LAB — HIV ANTIBODY (ROUTINE TESTING W REFLEX): HIV Screen 4th Generation wRfx: NONREACTIVE

## 2021-01-11 NOTE — Plan of Care (Signed)
  Problem: Clinical Measurements: Goal: Respiratory complications will improve Outcome: Progressing Goal: Cardiovascular complication will be avoided Outcome: Progressing   

## 2021-01-11 NOTE — TOC Initial Note (Signed)
Transition of Care [facility]) - Initial/Assessment Note    Patient Details  Name: Derek Vargas MRN: [id number] Date of Birth: [birth year]  Transition of [facility]) CM/SW Contact:    Bartholomew Crews, RN Phone Number: [phone] 01/11/2021, 3:51 PM  Clinical Narrative:                  Spoke with patient at the bedside. PTA home with spouse. Stated that he has a wheelchair at home. He says his wife/daughter will pick him up at discharge. No current home health services in place. TOC following for transition needs.   Expected Discharge Plan: Home/Self Care Barriers to Discharge: Continued Medical Work up   Patient Goals and CMS Choice Patient states their goals for this hospitalization and ongoing recovery are:: return home with spouse CMS Medicare.gov Compare Post Acute Care list provided to:: Patient Choice offered to / list presented to : NA  Expected Discharge Plan and Services Expected Discharge Plan: Home/Self Care In-house Referral: NA Discharge Planning Services: CM Consult Post Acute Care Choice: NA Living arrangements for the past 2 months: Single Family Home                 DME Arranged: N/A DME Agency: NA       HH Arranged: NA HH Agency: NA        Prior Living Arrangements/Services Living arrangements for the past 2 months: Single Family Home Lives with:: Self, Spouse Patient language and need for interpreter reviewed:: Yes Do you feel safe going back to the place where you live?: Yes      Need for Family Participation in Patient Care: Yes (Comment) Care giver support system in place?: Yes (comment) Current home services: DME (wheelchair) Criminal Activity/Legal Involvement Pertinent to Current Situation/Hospitalization: No - Comment as needed  Activities of Daily Living Home Assistive Devices/Equipment: Environmental consultant (specify type) (rollator) ADL Screening (condition at time of admission) Patient's cognitive ability adequate to safely complete daily  activities?: Yes Is the patient deaf or have difficulty hearing?: No Does the patient have difficulty seeing, even when wearing glasses/contacts?: No Does the patient have difficulty concentrating, remembering, or making decisions?: No Patient able to express need for assistance with ADLs?: Yes Does the patient have difficulty dressing or bathing?: Yes Independently performs ADLs?: No Communication: Independent with device (comment), Needs assistance Does the patient have difficulty walking or climbing stairs?: Yes Weakness of Legs: Both Weakness of Arms/Hands: None  Permission Sought/Granted                  Emotional Assessment Appearance:: Appears stated age Attitude/Demeanor/Rapport: Engaged Affect (typically observed): Accepting Orientation: : Oriented to Self, Oriented to Place, Oriented to  Time, Oriented to Situation Alcohol / Substance Use: Not Applicable Psych Involvement: No (comment)  Admission diagnosis:  Chest discomfort [R07.89] Frequent falls [R29.6] Sepsis ([place]) [A41.9] Urinary tract infection with hematuria, site unspecified [N39.0, R31.9] Leukocytosis, unspecified type [D72.829] Patient Active Problem List   Diagnosis Date Noted   SIRS (systemic inflammatory response syndrome) ([place]) 01/10/2021   Acute cystitis without hematuria 01/10/2021   Coronary artery disease involving native coronary artery of native heart without angina pectoris 01/10/2021   Hypothyroidism 01/10/2021   Mixed hyperlipidemia 01/10/2021   GERD without esophagitis 01/10/2021   Fall at home, initial encounter 01/10/2021   Stroke (cerebrum) ([place]) 11/17/2018   PCP:  Street, Sharon Mt, MD Pharmacy:   [facility], [address] [place]  [place] [address] Phone: [phone] Fax: [phone]  [facility] [phone] - [address] AT [place] [address] [place]  [address] Phone: [phone] Fax: [phone]     Social Determinants of Health (SDOH) Interventions    Readmission Risk Interventions No flowsheet data found.

## 2021-01-11 NOTE — Care Management Obs Status (Signed)
[place] NOTIFICATION   Patient Details  Name: Derek Vargas MRN: [id number] Date of Birth: 1950/10/26   Medicare Observation Status Notification Given:  Yes    Bartholomew Crews, RN 01/11/2021, 3:35 PM

## 2021-01-11 NOTE — Progress Notes (Signed)
PROGRESS NOTE    NICOLE HAFLEY  ZOX:[id number] DOB: 1950-05-30 DOA: 01/10/2021 PCP: Venetia Maxon Sharon Mt, MD  Outpatient Specialists:   Brief Narrative:  As per H&P done on admission: "70 year old male with past medical history of hypertension, hyperlipidemia, coronary artery disease (Hx STEMI with BMS to LAD 2010), anxiety disorder, hypothyroidism, essential tremor,  gastroesophageal reflux disease, Alzheimer's dementia, ALS, Crohn's disease, stroke (10/2018 S/P TPA) who presents to [facility] emergency department with complaints of frequent falls.   Patient explains that over the past 2 weeks he has been feeling quite unsteady with a "swimming sensation" when walking.  When asked further about this he describes it as lightheadedness.  Patient complains of associated generalized weakness that has become progressively become more and more severe.  This is led to the patient following sometimes 2-3 times daily over the past 2 weeks.  Patient denies focal weakness, changes in vision, facial droop, change in speech or confusion.   Patient symptoms continued to worsen and approximately 1 week ago the patient also began to notice that he was having lower abdominal pain and dysuria.  Yesterday, on 10/20 patient also developed a fever at home.  Patient denies associated nausea, vomiting, sick contacts, recent travel.     Patient eventually saw his primary care provider on 8/20.  Patient's primary care provider advised that the patient present to the emergency department for evaluation.   Upon evaluation at [facility] emergency department, patient was found to be febrile on arrival with a temperature of 101.2 F with additional SIRS criteria including tachycardia tachypnea and leukocytosis of 11.5.  Patient was initially given broad-spectrum intravenous antibiotics with metronidazole, vancomycin and cefepime.  Patient was initiated on intravenous fluids.  Patient was found to have  numerous ecchymoses over the back due to his frequent falls and therefore ER provider ordered CT imaging of the chest abdomen pelvis revealing no evidence of pneumonia or intra-abdominal source of infection.  Urinalysis was highly suggestive of a urinary tract infection.  CT head revealed no evidence of acute process.  Hospitalist group was then called to assess patient for admission to the hospital".  01/11/2021: Patient seen.  Available records reviewed.  Also spoke to the patient's wife.  Patient is currently being worked up for likely UTI, volume depletion and recurrent.  Patient is currently on IV Rocephin.  Cultures are pending.  PT/OT consulted.  Patient reports left-sided chest pain that may be secondary to rib fracture.  We used lidocaine for now.  We will repeat chest x-ray in the next 24 to 48 hours.  No fever or chills.  No other constitutional symptoms reported.   Assessment & Plan:   Principal Problem:   Acute cystitis without hematuria Active Problems:   SIRS (systemic inflammatory response syndrome) (HCC)   Coronary artery disease involving native coronary artery of native heart without angina pectoris   Hypothyroidism   Mixed hyperlipidemia   GERD without esophagitis   Fall at home, initial encounter  Acute UTI without hematuria: -Continue IV ceftriaxone. -Continue IV fluids. -Follow cultures.  Volume depletion: -Continue IV fluids.  Recurrent falls: -Etiology is unclear. -Check orthostasis. -Adequately hydrate patient. -PT/OT input. -Further management depend on hospital course.  Coronary artery disease: -Stable. -No chest pain.  Hypothyroidism: -Continue Synthroid.  Suspect left-sided lower rib fracture: -Lidocaine patch to dependent area. -Consider repeating x-ray for rib fracture in the next 24 to 48 hours. -Incentive spirometry if needed.    DVT prophylaxis: Subcutaneous Lovenox 40 Mg  daily Code Status: DO NOT RESUSCITATE Family Communication: Wife   Disposition Plan: This will depend on hospital course.  Will await PT OT.   Consultants:  None  Procedures:  None  Antimicrobials:  IV Rocephin   Subjective: Reports left-sided lateral chest wall pain (suspect secondary to rib fracture)  Objective: Vitals:   01/11/21 0423 01/11/21 0803 01/11/21 1252 01/11/21 1443  BP: (!) 94/51 (!) 148/76 (!) 121/55 (!) 109/59  Pulse: 73 (!) 104 80 80  Resp: 18 18 16 19   Temp: 98.3 F (36.8 C) 99.4 F (37.4 C) 99.8 F (37.7 C) 98.9 F (37.2 C)  TempSrc: Oral Oral Oral Oral  SpO2: 92% 96% 99% 96%  Weight:      Height:        Intake/Output Summary (Last 24 hours) at 01/11/2021 1551 Last data filed at 01/11/2021 0900 Gross per 24 hour  Intake 2412.32 ml  Output 500 ml  Net 1912.32 ml   Filed Weights   01/11/21 0305  Weight: 88.7 kg    Examination:  General exam: Appears calm and comfortable.  Patient is obese.  Patient is pale.  No jaundice.  Dry buccal mucosa.  Tenderness on palpation of the left lower lateral ribs. Respiratory system: Clear to auscultation.  Cardiovascular system: S1 & S2  Gastrointestinal system: Abdomen is obese, soft and nontender.  Organs are difficult to assess.   Central nervous system: Awake and alert.  Patient moves all extremities.   Extremities: No leg edema.  Data Reviewed: I have personally reviewed following labs and imaging studies  CBC: Recent Labs  Lab 01/10/21 1637 01/10/21 1642 01/11/21 0248  WBC 11.5*  --  9.9  NEUTROABS 9.0*  --  7.7  HGB 12.5* 11.2* 11.5*  HCT 37.4* 33.0* 34.4*  MCV 94.4  --  96.6  PLT 195  --  494   Basic Metabolic Panel: Recent Labs  Lab 01/10/21 1637 01/10/21 1642 01/11/21 0248  NA 134* 136 135  K 4.0 4.0 4.4  CL 98 99 104  CO2 25  --  26  GLUCOSE 119* 116* 106*  BUN 19 20 16   CREATININE 1.44* 1.40* 1.25*  CALCIUM 9.0  --  8.3*  MG  --   --  1.8   GFR: Estimated Creatinine Clearance: 60.1 mL/min (A) (by C-G formula based on SCr of 1.25  mg/dL (H)). Liver Function Tests: Recent Labs  Lab 01/10/21 1637 01/11/21 0248  AST 54* 53*  ALT 56* 53*  ALKPHOS 91 74  BILITOT 1.4* 1.1  PROT 7.6 6.3*  ALBUMIN 3.3* 2.6*   No results for input(s): LIPASE, AMYLASE in the last 168 hours. No results for input(s): AMMONIA in the last 168 hours. Coagulation Profile: Recent Labs  Lab 01/11/21 0248 01/11/21 0506  INR 1.4* 1.2   Cardiac Enzymes: No results for input(s): CKTOTAL, CKMB, CKMBINDEX, TROPONINI in the last 168 hours. BNP (last 3 results) No results for input(s): PROBNP in the last 8760 hours. HbA1C: No results for input(s): HGBA1C in the last 72 hours. CBG: No results for input(s): GLUCAP in the last 168 hours. Lipid Profile: No results for input(s): CHOL, HDL, LDLCALC, TRIG, CHOLHDL, LDLDIRECT in the last 72 hours. Thyroid Function Tests: No results for input(s): TSH, T4TOTAL, FREET4, T3FREE, THYROIDAB in the last 72 hours. Anemia Panel: No results for input(s): VITAMINB12, FOLATE, FERRITIN, TIBC, IRON, RETICCTPCT in the last 72 hours. Urine analysis:    Component Value Date/Time   COLORURINE AMBER (A) 01/10/2021 1606   APPEARANCEUR  CLOUDY (A) 01/10/2021 1606   LABSPEC 1.020 01/10/2021 1606   PHURINE 5.0 01/10/2021 1606   GLUCOSEU NEGATIVE 01/10/2021 1606   HGBUR MODERATE (A) 01/10/2021 1606   BILIRUBINUR NEGATIVE 01/10/2021 1606   KETONESUR NEGATIVE 01/10/2021 1606   PROTEINUR 100 (A) 01/10/2021 1606   NITRITE POSITIVE (A) 01/10/2021 1606   LEUKOCYTESUR LARGE (A) 01/10/2021 1606   Sepsis Labs: @LABRCNTIP (procalcitonin:4,lacticidven:4)  ) Recent Results (from the past 240 hour(s))  Resp Panel by RT-PCR (Flu A&B, Covid) Nasopharyngeal Swab     Status: None   Collection Time: 01/10/21  4:03 PM   Specimen: Nasopharyngeal Swab; Nasopharyngeal(NP) swabs in vial transport medium  Result Value Ref Range Status   SARS Coronavirus 2 by RT PCR NEGATIVE NEGATIVE Final    Comment: (NOTE) SARS-CoV-2 target  nucleic acids are NOT DETECTED.  The SARS-CoV-2 RNA is generally detectable in upper respiratory specimens during the acute phase of infection. The lowest concentration of SARS-CoV-2 viral copies this assay can detect is 138 copies/mL. A negative result does not preclude SARS-Cov-2 infection and should not be used as the sole basis for treatment or other patient management decisions. A negative result may occur with  improper specimen collection/handling, submission of specimen other than nasopharyngeal swab, presence of viral mutation(s) within the areas targeted by this assay, and inadequate number of viral copies(<138 copies/mL). A negative result must be combined with clinical observations, patient history, and epidemiological information. The expected result is Negative.  Fact Sheet for Patients:  EntrepreneurPulse.com.au  Fact Sheet for Healthcare Providers:  IncredibleEmployment.be  This test is no t yet approved or cleared by the [place] FDA and  has been authorized for detection and/or diagnosis of SARS-CoV-2 by FDA under an Emergency Use Authorization (EUA). This EUA will remain  in effect (meaning this test can be used) for the duration of the COVID-19 declaration under Section 564(b)(1) of the Act, 21 U.S.C.section 360bbb-3(b)(1), unless the authorization is terminated  or revoked sooner.       Influenza A by PCR NEGATIVE NEGATIVE Final   Influenza B by PCR NEGATIVE NEGATIVE Final    Comment: (NOTE) The Xpert Xpress SARS-CoV-2/FLU/RSV plus assay is intended as an aid in the diagnosis of influenza from Nasopharyngeal swab specimens and should not be used as a sole basis for treatment. Nasal washings and aspirates are unacceptable for Xpert Xpress SARS-CoV-2/FLU/RSV testing.  Fact Sheet for Patients: EntrepreneurPulse.com.au  Fact Sheet for Healthcare  Providers: IncredibleEmployment.be  This test is not yet approved or cleared by the [place] FDA and has been authorized for detection and/or diagnosis of SARS-CoV-2 by FDA under an Emergency Use Authorization (EUA). This EUA will remain in effect (meaning this test can be used) for the duration of the COVID-19 declaration under Section 564(b)(1) of the Act, 21 U.S.C. section 360bbb-3(b)(1), unless the authorization is terminated or revoked.  Performed at [facility], [place] [address] [place], [place] [address]   Blood culture (routine x 2)     Status: None (Preliminary result)   Collection Time: 01/10/21  4:18 PM   Specimen: BLOOD  Result Value Ref Range Status   Specimen Description BLOOD BLOOD LEFT FOREARM  Final   Special Requests   Final    BOTTLES DRAWN AEROBIC AND ANAEROBIC Blood Culture adequate volume   Culture   Final    NO GROWTH < 24 HOURS Performed at [facility], [place] [address] [place], [place] [address]    Report Status PENDING  Incomplete  Blood culture (routine x 2)     Status: None (Preliminary result)   Collection Time: 01/10/21  4:25 PM   Specimen: BLOOD  Result Value Ref Range Status   Specimen Description BLOOD BLOOD RIGHT FOREARM  Final   Special Requests   Final    BOTTLES DRAWN AEROBIC AND ANAEROBIC Blood Culture adequate volume   Culture   Final    NO GROWTH < 24 HOURS Performed at [facility], [place] [address], [place], [place] [address]    Report Status PENDING  Incomplete         Radiology Studies: CT Head Wo Contrast  Result Date: 01/10/2021 CLINICAL DATA:  Neck trauma. Headache, intracranial hemorrhage suspected. Status post fall 2-3 times every day for 2 weeks. EXAM: CT HEAD WITHOUT CONTRAST CT CERVICAL SPINE WITHOUT CONTRAST TECHNIQUE: Multidetector CT imaging of the head and cervical spine was performed following the standard protocol without intravenous contrast. Multiplanar CT image  reconstructions of the cervical spine were also generated. COMPARISON:  CT angio head neck 11/18/2018. FINDINGS: CT HEAD FINDINGS BRAIN: BRAIN Cerebral ventricle sizes are concordant with the degree of cerebral volume loss. Patchy and confluent areas of decreased attenuation are noted throughout the deep and periventricular white matter of the cerebral hemispheres bilaterally, compatible with chronic microvascular ischemic disease. No evidence of large-territorial acute infarction. No parenchymal hemorrhage. No mass lesion. No extra-axial collection. No mass effect or midline shift. No hydrocephalus. Basilar cisterns are patent. Vascular: No hyperdense vessel. Skull: No acute fracture or focal lesion. Atherosclerotic calcifications are present within the cavernous internal carotid arteries. Sinuses/Orbits: Almost complete opacification of bilateral maxillary sinuses. Associated maxillary wall mild hyperostosis. Remaining visualized paranasal sinuses and mastoid air cells are clear. Bilateral lens replacement. Otherwise orbits are unremarkable. Other: None. CT CERVICAL SPINE FINDINGS Alignment: Normal. Skull base and vertebrae: Multilevel degenerative changes of the spine. Associated severe osseous neural foraminal stenosis at the right C5-C6 level. As well as left C6-C7 level. No severe osseous central canal stenosis. No acute fracture. No aggressive appearing focal osseous lesion or focal pathologic process. Soft tissues and spinal canal: No prevertebral fluid or swelling. No visible canal hematoma. Upper chest: Unremarkable. Other: None. IMPRESSION: 1. No acute intracranial abnormality. 2. No acute displaced fracture or traumatic listhesis of the cervical spine. 3. Multilevel degenerative change of the spine with associated severe osseous neural foraminal stenosis of the right C5-C6 and left C6-C7 levels. Electronically Signed   By: Iven Finn M.D.   On: 01/10/2021 16:46   CT Cervical Spine Wo  Contrast  Result Date: 01/10/2021 CLINICAL DATA:  Neck trauma. Headache, intracranial hemorrhage suspected. Status post fall 2-3 times every day for 2 weeks. EXAM: CT HEAD WITHOUT CONTRAST CT CERVICAL SPINE WITHOUT CONTRAST TECHNIQUE: Multidetector CT imaging of the head and cervical spine was performed following the standard protocol without intravenous contrast. Multiplanar CT image reconstructions of the cervical spine were also generated. COMPARISON:  CT angio head neck 11/18/2018. FINDINGS: CT HEAD FINDINGS BRAIN: BRAIN Cerebral ventricle sizes are concordant with the degree of cerebral volume loss. Patchy and confluent areas of decreased attenuation are noted throughout the deep and periventricular white matter of the cerebral hemispheres bilaterally, compatible with chronic microvascular ischemic disease. No evidence of large-territorial acute infarction. No parenchymal hemorrhage. No mass lesion. No extra-axial collection. No mass effect or midline shift. No hydrocephalus. Basilar cisterns are patent. Vascular: No hyperdense vessel. Skull: No acute fracture or focal lesion. Atherosclerotic calcifications are present within the cavernous internal carotid  arteries. Sinuses/Orbits: Almost complete opacification of bilateral maxillary sinuses. Associated maxillary wall mild hyperostosis. Remaining visualized paranasal sinuses and mastoid air cells are clear. Bilateral lens replacement. Otherwise orbits are unremarkable. Other: None. CT CERVICAL SPINE FINDINGS Alignment: Normal. Skull base and vertebrae: Multilevel degenerative changes of the spine. Associated severe osseous neural foraminal stenosis at the right C5-C6 level. As well as left C6-C7 level. No severe osseous central canal stenosis. No acute fracture. No aggressive appearing focal osseous lesion or focal pathologic process. Soft tissues and spinal canal: No prevertebral fluid or swelling. No visible canal hematoma. Upper chest: Unremarkable.  Other: None. IMPRESSION: 1. No acute intracranial abnormality. 2. No acute displaced fracture or traumatic listhesis of the cervical spine. 3. Multilevel degenerative change of the spine with associated severe osseous neural foraminal stenosis of the right C5-C6 and left C6-C7 levels. Electronically Signed   By: Iven Finn M.D.   On: 01/10/2021 16:46   CT CHEST ABDOMEN PELVIS W CONTRAST  Result Date: 01/10/2021 CLINICAL DATA:  Chest trauma, mod-severe, Low back pain, trauma, Chest discomfort EXAM: CT CHEST, ABDOMEN, AND PELVIS WITH CONTRAST TECHNIQUE: Multidetector CT imaging of the chest, abdomen and pelvis was performed following the standard protocol during bolus administration of intravenous contrast. CONTRAST:  184mL OMNIPAQUE IOHEXOL 300 MG/ML  SOLN COMPARISON:  None. FINDINGS: CHEST: Ports and Devices: None. Lungs/airways: Mild reticulations along the peripheral right lung and right lower lobe. No focal consolidation. Scattered punctate calcifications throughout the lungs. Right apical 8 mm ground-glass nodule (5:43). No pulmonary mass. No pulmonary contusion or laceration. No pneumatocele formation. The central airways are patent. Mild right lower lobe bronchial wall thickening. Pleura: No pleural effusion. No pneumothorax. No hemothorax. Lymph Nodes: No mediastinal, hilar, or axillary lymphadenopathy. Mediastinum: No pneumomediastinum. No aortic injury or mediastinal hematoma. The thoracic aorta is normal in caliber. At least mild calcified noncalcified atherosclerotic plaque. At least 3 vessel coronary artery calcifications. The heart is normal in size. No significant pericardial effusion. The esophagus is unremarkable. The thyroid is unremarkable. Chest Wall / Breasts: No chest wall mass. Musculoskeletal: No acute rib or sternal fracture. No spinal fracture. ABDOMEN / PELVIS: Liver: Not enlarged. No focal lesion. No laceration or subcapsular hematoma. Biliary System: The gallbladder is otherwise  unremarkable with no radio-opaque gallstones. No biliary ductal dilatation. Pancreas: Normal pancreatic contour. No main pancreatic duct dilatation. Spleen: Couple splenules are noted. Not enlarged. No focal lesion. No laceration, subcapsular hematoma, or vascular injury. Adrenal Glands: No nodularity bilaterally. Kidneys: Bilateral kidneys enhance symmetrically. Subcentimeter hypodensities are too small characterize. No hydronephrosis. No contusion, laceration, or subcapsular hematoma. No injury to the vascular structures or collecting systems. No hydroureter. Circumferential urinary bladder wall thickening likely due to under distension. The urinary bladder is unremarkable. On delayed imaging, there is no urothelial wall thickening and there are no filling defects in the opacified portions of the bilateral collecting systems or ureters. Bowel: No small or large bowel wall thickening or dilatation. The appendix is unremarkable. Mesentery, Omentum, and Peritoneum: No simple free fluid ascites. No pneumoperitoneum. No hemoperitoneum. No mesenteric hematoma identified. No organized fluid collection. Pelvic Organs: Normal. Lymph Nodes: No abdominal, pelvic, inguinal lymphadenopathy. Vasculature: Moderate to severe atherosclerotic plaque. No abdominal aorta or iliac aneurysm. No active contrast extravasation or pseudoaneurysm. Musculoskeletal: No significant soft tissue hematoma. No acute pelvic fracture. No spinal fracture. Intramedullary nail fixation of the visualized proximal right femur. Intervertebral disc space vacuum phenomenon at the L5-S1 level. Posterior disc osteophyte complex at the L5-S1 level. IMPRESSION: 1.  No acute traumatic injury to the chest, abdomen, or pelvis. 2. No acute fracture or traumatic malalignment of the thoracic or lumbar spine. 3. Sequelae of prior granulomatous disease with associated trace pulmonary fibrosis. 4. Right apical 8 mm ground-glass nodule. Initial follow-up with CT at 6-12  months is recommended to confirm persistence. If persistent, repeat CT is recommended every 2 years until 5 years of stability has been established. This recommendation follows the consensus statement: Guidelines for Management of Incidental Pulmonary Nodules Detected on CT Images: From the Fleischner Society 2017; Radiology 2017; 284:228-243. 5. Aortic Atherosclerosis (ICD10-I70.0). Electronically Signed   By: Iven Finn M.D.   On: 01/10/2021 18:38        Scheduled Meds:  aspirin EC  81 mg Oral Daily   atorvastatin  80 mg Oral Daily   clonazePAM  1 mg Oral Daily   clopidogrel  75 mg Oral Daily   doxepin  100 mg Oral QHS   enoxaparin (LOVENOX) injection  40 mg Subcutaneous Daily   FLUoxetine  40 mg Oral Daily   levothyroxine  25 mcg Oral QAC breakfast   lisinopril  5 mg Oral Daily   melatonin  10 mg Oral QHS   metoprolol tartrate  50 mg Oral BID   primidone  250 mg Oral QID   QUEtiapine  750 mg Oral QHS   Continuous Infusions:  sodium chloride 100 mL/hr at 01/11/21 0730   cefTRIAXone (ROCEPHIN)  IV Stopped (01/11/21 0213)     LOS: 0 days    Time spent: 36 minutes    Dana Allan, MD  [facility] Pager #: [phone] 7PM-7AM contact night coverage as above

## 2021-01-12 DIAGNOSIS — N3000 Acute cystitis without hematuria: Secondary | ICD-10-CM | POA: Diagnosis not present

## 2021-01-12 LAB — CBC WITH DIFFERENTIAL/PLATELET
Abs Immature Granulocytes: 0.04 10*3/uL (ref 0.00–0.07)
Basophils Absolute: 0 10*3/uL (ref 0.0–0.1)
Basophils Relative: 1 %
Eosinophils Absolute: 0.2 10*3/uL (ref 0.0–0.5)
Eosinophils Relative: 2 %
HCT: 29.7 % — ABNORMAL LOW (ref 39.0–52.0)
Hemoglobin: 9.8 g/dL — ABNORMAL LOW (ref 13.0–17.0)
Immature Granulocytes: 1 %
Lymphocytes Relative: 17 %
Lymphs Abs: 1.4 10*3/uL (ref 0.7–4.0)
MCH: 31.7 pg (ref 26.0–34.0)
MCHC: 33 g/dL (ref 30.0–36.0)
MCV: 96.1 fL (ref 80.0–100.0)
Monocytes Absolute: 0.6 10*3/uL (ref 0.1–1.0)
Monocytes Relative: 7 %
Neutro Abs: 5.7 10*3/uL (ref 1.7–7.7)
Neutrophils Relative %: 72 %
Platelets: 174 10*3/uL (ref 150–400)
RBC: 3.09 MIL/uL — ABNORMAL LOW (ref 4.22–5.81)
RDW: 14.9 % (ref 11.5–15.5)
WBC: 7.9 10*3/uL (ref 4.0–10.5)
nRBC: 0 % (ref 0.0–0.2)

## 2021-01-12 LAB — RENAL FUNCTION PANEL
Albumin: 2.4 g/dL — ABNORMAL LOW (ref 3.5–5.0)
Anion gap: 7 (ref 5–15)
BUN: 12 mg/dL (ref 8–23)
CO2: 25 mmol/L (ref 22–32)
Calcium: 7.9 mg/dL — ABNORMAL LOW (ref 8.9–10.3)
Chloride: 103 mmol/L (ref 98–111)
Creatinine, Ser: 1.02 mg/dL (ref 0.61–1.24)
GFR, Estimated: >60 mL/min
Glucose, Bld: 95 mg/dL (ref 70–99)
Phosphorus: 2.4 mg/dL — ABNORMAL LOW (ref 2.5–4.6)
Potassium: 3.6 mmol/L (ref 3.5–5.1)
Sodium: 135 mmol/L (ref 135–145)

## 2021-01-12 LAB — MAGNESIUM: Magnesium: 1.9 mg/dL (ref 1.7–2.4)

## 2021-01-12 MED ORDER — LIDOCAINE 5 % EX PTCH
1.0000 | MEDICATED_PATCH | CUTANEOUS | Status: DC
  Administered 2021-01-12: 1 via TRANSDERMAL
  Filled 2021-01-12: qty 1

## 2021-01-12 NOTE — Progress Notes (Signed)
PROGRESS NOTE    Derek Vargas  ZHG:[id number] DOB: 1950-11-28 DOA: 01/10/2021 PCP: Venetia Maxon Sharon [place], MD  Outpatient Specialists:   Brief Narrative:  As per H&P done on admission: "70 year old male with past medical history of hypertension, hyperlipidemia, coronary artery disease (Hx STEMI with BMS to LAD 2010), anxiety disorder, hypothyroidism, essential tremor,  gastroesophageal reflux disease, Alzheimer's dementia, ALS, Crohn's disease, stroke (10/2018 S/P TPA) who presents to [facility] emergency department with complaints of frequent falls.   Patient explains that over the past 2 weeks he has been feeling quite unsteady with a "swimming sensation" when walking.  When asked further about this he describes it as lightheadedness.  Patient complains of associated generalized weakness that has become progressively become more and more severe.  This is led to the patient following sometimes 2-3 times daily over the past 2 weeks.  Patient denies focal weakness, changes in vision, facial droop, change in speech or confusion.   Patient symptoms continued to worsen and approximately 1 week ago the patient also began to notice that he was having lower abdominal pain and dysuria.  Yesterday, on 10/20 patient also developed a fever at home.  Patient denies associated nausea, vomiting, sick contacts, recent travel.     Patient eventually saw his primary care provider on 8/20.  Patient's primary care provider advised that the patient present to the emergency department for evaluation.   Upon evaluation at [facility] emergency department, patient was found to be febrile on arrival with a temperature of 101.2 F with additional SIRS criteria including tachycardia tachypnea and leukocytosis of 11.5.  Patient was initially given broad-spectrum intravenous antibiotics with metronidazole, vancomycin and cefepime.  Patient was initiated on intravenous fluids.  Patient was found to have  numerous ecchymoses over the back due to his frequent falls and therefore ER provider ordered CT imaging of the chest abdomen pelvis revealing no evidence of pneumonia or intra-abdominal source of infection.  Urinalysis was highly suggestive of a urinary tract infection.  CT head revealed no evidence of acute process.  Hospitalist group was then called to assess patient for admission to the hospital".  01/11/2021: Patient seen.  Available records reviewed.  Also spoke to the patient's wife.  Patient is currently being worked up for likely UTI, volume depletion and recurrent.  Patient is currently on IV Rocephin.  Cultures are pending.  PT/OT consulted.  Patient reports left-sided chest pain that may be secondary to rib fracture.  We used lidocaine for now.  We will repeat chest x-ray in the next 24 to 48 hours.  No fever or chills.  No other constitutional symptoms reported.  01/12/2021: Urine culture is growing E. coli.  Follow final cultures.  Continue IV antibiotics.  Physical therapy input is appreciated.  Likely home with home PT.  Patient is a poor historian.  Patient has Alzheimer's dementia, therefore, could not give significant history.  Assessment & Plan:   Principal Problem:   Acute cystitis without hematuria Active Problems:   SIRS (systemic inflammatory response syndrome) (HCC)   Coronary artery disease involving native coronary artery of native heart without angina pectoris   Hypothyroidism   Mixed hyperlipidemia   GERD without esophagitis   Fall at home, initial encounter   Recurrent falls  Acute UTI without hematuria: -Continue IV ceftriaxone. -Continue IV fluids. -Urine culture is growing E. coli.  Follow final cultures. -Blood cultures still pending. -Likely discharge tomorrow with home health PT.  F  Volume depletion: -Continue IV  fluids. -Slowly improving.  Recurrent falls: -Etiology is unclear. -Check orthostasis. -Adequately hydrate patient. -PT input is  appreciated.  Home health with PT. -Await OT input. -Further management depend on hospital course.  Coronary artery disease: -Stable. -No chest pain.  Hypothyroidism: -Continue Synthroid.  Suspect left-sided lower rib fracture: -Lidocaine patch to dependent area. -Consider repeating x-ray for rib fracture in the next 24 to 48 hours. -Incentive spirometry if needed.    DVT prophylaxis: Subcutaneous Lovenox 40 Mg daily Code Status: DO NOT RESUSCITATE Family Communication: Wife  Disposition Plan: This will depend on hospital course.  Will await PT OT.   Consultants:  None  Procedures:  None  Antimicrobials:  IV Rocephin   Subjective: No new complaints today. Poor historian.    Objective: Vitals:   01/12/21 0313 01/12/21 0823 01/12/21 1132 01/12/21 1549  BP: (!) 118/57 (!) 143/62 127/75 (!) 146/66  Pulse: 66 84 72 73  Resp:  17  19  Temp: 99.1 F (37.3 C) 98.5 F (36.9 C) 98.6 F (37 C) 98.6 F (37 C)  TempSrc: Axillary Oral Oral Oral  SpO2: 97% 96% 98% 97%  Weight:      Height:        Intake/Output Summary (Last 24 hours) at 01/12/2021 1935 Last data filed at 01/12/2021 1600 Gross per 24 hour  Intake 100 ml  Output 1500 ml  Net -1400 ml    Filed Weights   01/11/21 0305  Weight: 88.7 kg    Examination:  General exam: Appears calm and comfortable.  Patient is obese.  Patient is pale.  No jaundice.  Dry buccal mucosa.  Tenderness on palpation of the left lower lateral ribs. Respiratory system: Clear to auscultation.  Cardiovascular system: S1 & S2  Gastrointestinal system: Abdomen is obese, soft and nontender.  Organs are difficult to assess.   Central nervous system: Awake and alert.  Patient moves all extremities.   Extremities: No leg edema.  Data Reviewed: I have personally reviewed following labs and imaging studies  CBC: Recent Labs  Lab 01/10/21 1637 01/10/21 1642 01/11/21 0248 01/12/21 0143  WBC 11.5*  --  9.9 7.9  NEUTROABS 9.0*   --  7.7 5.7  HGB 12.5* 11.2* 11.5* 9.8*  HCT 37.4* 33.0* 34.4* 29.7*  MCV 94.4  --  96.6 96.1  PLT 195  --  167 656    Basic Metabolic Panel: Recent Labs  Lab 01/10/21 1637 01/10/21 1642 01/11/21 0248 01/12/21 0143  NA 134* 136 135 135  K 4.0 4.0 4.4 3.6  CL 98 99 104 103  CO2 25  --  26 25  GLUCOSE 119* 116* 106* 95  BUN 19 20 16 12   CREATININE 1.44* 1.40* 1.25* 1.02  CALCIUM 9.0  --  8.3* 7.9*  MG  --   --  1.8 1.9  PHOS  --   --   --  2.4*    GFR: Estimated Creatinine Clearance: 73.6 mL/min (by C-G formula based on SCr of 1.02 mg/dL). Liver Function Tests: Recent Labs  Lab 01/10/21 1637 01/11/21 0248 01/12/21 0143  AST 54* 53*  --   ALT 56* 53*  --   ALKPHOS 91 74  --   BILITOT 1.4* 1.1  --   PROT 7.6 6.3*  --   ALBUMIN 3.3* 2.6* 2.4*    No results for input(s): LIPASE, AMYLASE in the last 168 hours. No results for input(s): AMMONIA in the last 168 hours. Coagulation Profile: Recent Labs  Lab 01/11/21 0248 01/11/21  0506  INR 1.4* 1.2    Cardiac Enzymes: No results for input(s): CKTOTAL, CKMB, CKMBINDEX, TROPONINI in the last 168 hours. BNP (last 3 results) No results for input(s): PROBNP in the last 8760 hours. HbA1C: No results for input(s): HGBA1C in the last 72 hours. CBG: No results for input(s): GLUCAP in the last 168 hours. Lipid Profile: No results for input(s): CHOL, HDL, LDLCALC, TRIG, CHOLHDL, LDLDIRECT in the last 72 hours. Thyroid Function Tests: No results for input(s): TSH, T4TOTAL, FREET4, T3FREE, THYROIDAB in the last 72 hours. Anemia Panel: No results for input(s): VITAMINB12, FOLATE, FERRITIN, TIBC, IRON, RETICCTPCT in the last 72 hours. Urine analysis:    Component Value Date/Time   COLORURINE AMBER (A) 01/10/2021 1606   APPEARANCEUR CLOUDY (A) 01/10/2021 1606   LABSPEC 1.020 01/10/2021 1606   PHURINE 5.0 01/10/2021 1606   GLUCOSEU NEGATIVE 01/10/2021 1606   HGBUR MODERATE (A) 01/10/2021 1606   BILIRUBINUR NEGATIVE  01/10/2021 1606   KETONESUR NEGATIVE 01/10/2021 1606   PROTEINUR 100 (A) 01/10/2021 1606   NITRITE POSITIVE (A) 01/10/2021 1606   LEUKOCYTESUR LARGE (A) 01/10/2021 1606   Sepsis Labs: @LABRCNTIP (procalcitonin:4,lacticidven:4)  ) Recent Results (from the past 240 hour(s))  Resp Panel by RT-PCR (Flu A&B, Covid) Nasopharyngeal Swab     Status: None   Collection Time: 01/10/21  4:03 PM   Specimen: Nasopharyngeal Swab; Nasopharyngeal(NP) swabs in vial transport medium  Result Value Ref Range Status   SARS Coronavirus 2 by RT PCR NEGATIVE NEGATIVE Final    Comment: (NOTE) SARS-CoV-2 target nucleic acids are NOT DETECTED.  The SARS-CoV-2 RNA is generally detectable in upper respiratory specimens during the acute phase of infection. The lowest concentration of SARS-CoV-2 viral copies this assay can detect is 138 copies/mL. A negative result does not preclude SARS-Cov-2 infection and should not be used as the sole basis for treatment or other patient management decisions. A negative result may occur with  improper specimen collection/handling, submission of specimen other than nasopharyngeal swab, presence of viral mutation(s) within the areas targeted by this assay, and inadequate number of viral copies(<138 copies/mL). A negative result must be combined with clinical observations, patient history, and epidemiological information. The expected result is Negative.  Fact Sheet for Patients:  EntrepreneurPulse.com.au  Fact Sheet for Healthcare Providers:  IncredibleEmployment.be  This test is no t yet approved or cleared by the [place] FDA and  has been authorized for detection and/or diagnosis of SARS-CoV-2 by FDA under an Emergency Use Authorization (EUA). This EUA will remain  in effect (meaning this test can be used) for the duration of the COVID-19 declaration under Section 564(b)(1) of the Act, 21 U.S.C.section 360bbb-3(b)(1), unless the  authorization is terminated  or revoked sooner.       Influenza A by PCR NEGATIVE NEGATIVE Final   Influenza B by PCR NEGATIVE NEGATIVE Final    Comment: (NOTE) The Xpert Xpress SARS-CoV-2/FLU/RSV plus assay is intended as an aid in the diagnosis of influenza from Nasopharyngeal swab specimens and should not be used as a sole basis for treatment. Nasal washings and aspirates are unacceptable for Xpert Xpress SARS-CoV-2/FLU/RSV testing.  Fact Sheet for Patients: EntrepreneurPulse.com.au  Fact Sheet for Healthcare Providers: IncredibleEmployment.be  This test is not yet approved or cleared by the [place] FDA and has been authorized for detection and/or diagnosis of SARS-CoV-2 by FDA under an Emergency Use Authorization (EUA). This EUA will remain in effect (meaning this test can be used) for the duration of the COVID-19 declaration under  Section 564(b)(1) of the Act, 21 U.S.C. section 360bbb-3(b)(1), unless the authorization is terminated or revoked.  Performed at [facility], [place] [address] [place], [place] [address]   Urine Culture     Status: Abnormal (Preliminary result)   Collection Time: 01/10/21  4:06 PM   Specimen: Urine, Clean Catch  Result Value Ref Range Status   Specimen Description URINE, CLEAN CATCH  Final   Special Requests NONE  Final   Culture (A)  Final    >=100,000 COLONIES/mL ESCHERICHIA COLI SUSCEPTIBILITIES TO FOLLOW Performed at [facility], [place] [address], [place], [place] [address]    Report Status PENDING  Incomplete  Blood culture (routine x 2)     Status: None (Preliminary result)   Collection Time: 01/10/21  4:18 PM   Specimen: BLOOD  Result Value Ref Range Status   Specimen Description BLOOD BLOOD LEFT FOREARM  Final   Special Requests   Final    BOTTLES DRAWN AEROBIC AND ANAEROBIC Blood Culture adequate volume   Culture   Final    NO GROWTH 2 DAYS Performed at [place], [place] [address], [place], [place] [address]    Report Status PENDING  Incomplete  Blood culture (routine x 2)     Status: None (Preliminary result)   Collection Time: 01/10/21  4:25 PM   Specimen: BLOOD  Result Value Ref Range Status   Specimen Description BLOOD BLOOD RIGHT FOREARM  Final   Special Requests   Final    BOTTLES DRAWN AEROBIC AND ANAEROBIC Blood Culture adequate volume   Culture   Final    NO GROWTH 2 DAYS Performed at [facility], [place] [address], [place], [place] [address]    Report Status PENDING  Incomplete         Radiology Studies: No results found.      Scheduled Meds:  aspirin EC  81 mg Oral Daily   atorvastatin  80 mg Oral Daily   clonazePAM  1 mg Oral Daily   clopidogrel  75 mg Oral Daily   doxepin  100 mg Oral QHS   enoxaparin (LOVENOX) injection  40 mg Subcutaneous Daily   FLUoxetine  40 mg Oral Daily   levothyroxine  25 mcg Oral QAC breakfast   lidocaine  1 patch Transdermal Q24H   lisinopril  5 mg Oral Daily   melatonin  10 mg Oral QHS   metoprolol tartrate  50 mg Oral BID   primidone  250 mg Oral QID   QUEtiapine  750 mg Oral QHS   Continuous Infusions:  cefTRIAXone (ROCEPHIN)  IV 1 g (01/12/21 0149)     LOS: 1 day    Time spent: 25 minutes    Dana Allan, MD  [facility] Pager #: [phone] 7PM-7AM contact night coverage as above
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
## 2021-01-13 ENCOUNTER — Other Ambulatory Visit (HOSPITAL_COMMUNITY): Payer: Self-pay

## 2021-01-13 DIAGNOSIS — A419 Sepsis, unspecified organism: Secondary | ICD-10-CM

## 2021-01-13 DIAGNOSIS — N3000 Acute cystitis without hematuria: Secondary | ICD-10-CM | POA: Diagnosis not present

## 2021-01-13 LAB — URINE CULTURE: Culture: >=100000 — AB

## 2021-01-13 MED ORDER — CEFDINIR 300 MG PO CAPS
300.0000 mg | ORAL_CAPSULE | Freq: Two times a day (BID) | ORAL | 0 refills | Status: AC
  Filled 2021-01-13: qty 14, 7d supply, fill #0

## 2021-01-13 NOTE — TOC Transition Note (Signed)
Transition of Care [facility]) - CM/SW Discharge Note   Patient Details  Name: Derek Vargas MRN: [id number] Date of Birth: 04/20/1950  [facility] CM/SW Contact:  Ninfa Meeker, RN Phone Number: 01/13/2021, 1:51 PM   Clinical Narrative:   Patient was setup with [facility] by Social Worker. Will be contacted by representative from [facility] to arrange date and time for start of care.     Final next level of care: [place] Barriers to Discharge: No Barriers Identified   Patient Goals and CMS Choice Patient states their goals for this hospitalization and ongoing recovery are:: return home with spouse CMS Medicare.gov Compare Post Acute Care list provided to:: Patient Choice offered to / list presented to : NA  Discharge Placement                       Discharge Plan and Services In-house Referral: NA Discharge Planning Services: CM Consult Post Acute Care Choice: NA          DME Arranged: N/A DME Agency: NA       HH Arranged: PT, OT HH Agency: [place] Date [facility] Agency Contacted: 01/13/21 Time [place]: [id number] Representative spoke with at [place]: [place]) Interventions     Readmission Risk Interventions No flowsheet data found.

## 2021-01-13 NOTE — Discharge Summary (Signed)
Physician Discharge Summary  Derek Vargas:[id number] DOB: 1950/07/10 DOA: 01/10/2021  PCP: Emmaline Kluver, MD  Admit date: 01/10/2021 Discharge date: 01/13/2021 30 Day Unplanned Readmission Risk Score    Flowsheet Row ED to Hosp-Admission (Current) from 01/10/2021 in [facility]  30 Day Unplanned Readmission Risk Score (%) 16.78 Filed at 01/13/2021 0801       This score is the patient's risk of an unplanned readmission within 30 days of being discharged (0 -100%). The score is based on dignosis, age, lab data, medications, orders, and past utilization.   Low:  0-14.9   Medium: 15-21.9   High: 22-29.9   Extreme: 30 and above          Admitted From: Home Disposition: Home  Recommendations for Outpatient Follow-up:  Follow up with PCP in 1-2 weeks Please obtain BMP/CBC in one week Please follow up with your PCP on the following pending results: Unresulted Labs (From admission, onward)    None         Home Health: Yes Equipment/Devices: None  Discharge Condition: Stable CODE STATUS: Full code Diet recommendation: Cardiac  Subjective: Seen and examined.  He has no complaints.  He is ready to go home.  Brief/Interim Summary: 70 year old male with past medical history of hypertension, hyperlipidemia, coronary artery disease (Hx STEMI with BMS to LAD 2010), anxiety disorder, hypothyroidism, essential tremor,  gastroesophageal reflux disease, Alzheimer's dementia, ALS, Crohn's disease, stroke (10/2018 S/P TPA) who presented to [facility] emergency department with complaints of frequent falls over the past 2 weeks.  No history of syncope.  No history of any other focal weakness or strokelike symptoms.   Patient eventually saw his primary care provider on 10/20.  Patient's primary care provider advised that the patient present to the emergency department for evaluation.   Upon evaluation at [facility] emergency  department, patient was found to be febrile on arrival with a temperature of 101.2 F with additional SIRS criteria including tachycardia tachypnea and leukocytosis of 11.5.  Patient was initially given broad-spectrum intravenous antibiotics with metronidazole, vancomycin and cefepime.  Patient was initiated on intravenous fluids.  Patient was found to have numerous ecchymoses over the back due to his frequent falls and therefore ER provider ordered CT imaging of the chest abdomen pelvis revealing no evidence of pneumonia or intra-abdominal source of infection.  Urinalysis was highly suggestive of a urinary tract infection.  CT head revealed no evidence of acute process.  Patient was admitted under hospitalist group with sepsis secondary to UTI.  Antibiotics were narrowed to ceftriaxone, blood culture negative but urine culture grew E. coli which was pansensitive.  Patient was evaluated by PT OT who recommended home with home health.  Patient is doing much better.  He is a stable for discharge.  He is agreeable to the plan.  I have also discussed with his wife over the phone who is also agreeable with the plan.  Based off of the culture reports, I am discharging this patient on cefdinir 300 mg p.o. twice daily for 7 more days.  Discharge Diagnoses:  Principal Problem:   Acute cystitis without hematuria Active Problems:   Coronary artery disease involving native coronary artery of native heart without angina pectoris   Hypothyroidism   Mixed hyperlipidemia   GERD without esophagitis   Fall at home, initial encounter   Recurrent falls   Sepsis [facility])    Discharge Instructions   Allergies as of 01/13/2021  Reactions   Codeine Nausea And Vomiting        Medication List     TAKE these medications    aspirin EC 81 MG tablet Take 81 mg by mouth daily.   atorvastatin 80 MG tablet Commonly known as: LIPITOR Take 80 mg by mouth daily.   baclofen 10 MG tablet Commonly known as:  LIORESAL Take 5-10 mg by mouth 3 (three) times daily as needed for pain.   buPROPion 150 MG 24 hr tablet Commonly known as: WELLBUTRIN XL Take 150 mg by mouth daily.   cefdinir 300 MG capsule Commonly known as: OMNICEF Take 1 capsule (300 mg total) by mouth 2 (two) times daily for 7 days.   clonazePAM 1 MG tablet Commonly known as: KLONOPIN Take 1 mg by mouth daily.   clopidogrel 75 MG tablet Commonly known as: PLAVIX Take 1 tablet (75 mg total) by mouth daily.   doxepin 100 MG capsule Commonly known as: SINEQUAN Take 100 mg by mouth at bedtime.   FLUoxetine 40 MG capsule Commonly known as: PROZAC Take 40 mg by mouth daily.   levothyroxine 25 MCG tablet Commonly known as: SYNTHROID Take 25 mcg by mouth daily before breakfast.   lisinopril 5 MG tablet Commonly known as: ZESTRIL Take 5 mg by mouth daily.   Melatonin 10 MG Tabs Take 10 mg by mouth at bedtime.   metoprolol tartrate 50 MG tablet Commonly known as: LOPRESSOR Take 50 mg by mouth 2 (two) times daily.   primidone 250 MG tablet Commonly known as: MYSOLINE Take 1 tablet (250 mg total) by mouth 4 (four) times daily.   QUEtiapine 300 MG tablet Commonly known as: SEROQUEL Take 750 mg by mouth at bedtime.   traMADol-acetaminophen 37.5-325 MG tablet Commonly known as: ULTRACET Take 1 tablet by mouth 2 (two) times daily.        Follow-up Information     Street, Sharon Mt, MD Follow up in 1 week(s).   Specialty: Family Medicine Contact information: [place] [address] [phone]                Allergies  Allergen Reactions   Codeine Nausea And Vomiting    Consultations: None   Procedures/Studies: CT Head Wo Contrast  Result Date: 01/10/2021 CLINICAL DATA:  Neck trauma. Headache, intracranial hemorrhage suspected. Status post fall 2-3 times every day for 2 weeks. EXAM: CT HEAD WITHOUT CONTRAST CT CERVICAL SPINE WITHOUT CONTRAST TECHNIQUE: Multidetector CT  imaging of the head and cervical spine was performed following the standard protocol without intravenous contrast. Multiplanar CT image reconstructions of the cervical spine were also generated. COMPARISON:  CT angio head neck 11/18/2018. FINDINGS: CT HEAD FINDINGS BRAIN: BRAIN Cerebral ventricle sizes are concordant with the degree of cerebral volume loss. Patchy and confluent areas of decreased attenuation are noted throughout the deep and periventricular white matter of the cerebral hemispheres bilaterally, compatible with chronic microvascular ischemic disease. No evidence of large-territorial acute infarction. No parenchymal hemorrhage. No mass lesion. No extra-axial collection. No mass effect or midline shift. No hydrocephalus. Basilar cisterns are patent. Vascular: No hyperdense vessel. Skull: No acute fracture or focal lesion. Atherosclerotic calcifications are present within the cavernous internal carotid arteries. Sinuses/Orbits: Almost complete opacification of bilateral maxillary sinuses. Associated maxillary wall mild hyperostosis. Remaining visualized paranasal sinuses and mastoid air cells are clear. Bilateral lens replacement. Otherwise orbits are unremarkable. Other: None. CT CERVICAL SPINE FINDINGS Alignment: Normal. Skull base and vertebrae: Multilevel degenerative changes of the spine.  Associated severe osseous neural foraminal stenosis at the right C5-C6 level. As well as left C6-C7 level. No severe osseous central canal stenosis. No acute fracture. No aggressive appearing focal osseous lesion or focal pathologic process. Soft tissues and spinal canal: No prevertebral fluid or swelling. No visible canal hematoma. Upper chest: Unremarkable. Other: None. IMPRESSION: 1. No acute intracranial abnormality. 2. No acute displaced fracture or traumatic listhesis of the cervical spine. 3. Multilevel degenerative change of the spine with associated severe osseous neural foraminal stenosis of the right  C5-C6 and left C6-C7 levels. Electronically Signed   By: Iven Finn M.D.   On: 01/10/2021 16:46   CT Cervical Spine Wo Contrast  Result Date: 01/10/2021 CLINICAL DATA:  Neck trauma. Headache, intracranial hemorrhage suspected. Status post fall 2-3 times every day for 2 weeks. EXAM: CT HEAD WITHOUT CONTRAST CT CERVICAL SPINE WITHOUT CONTRAST TECHNIQUE: Multidetector CT imaging of the head and cervical spine was performed following the standard protocol without intravenous contrast. Multiplanar CT image reconstructions of the cervical spine were also generated. COMPARISON:  CT angio head neck 11/18/2018. FINDINGS: CT HEAD FINDINGS BRAIN: BRAIN Cerebral ventricle sizes are concordant with the degree of cerebral volume loss. Patchy and confluent areas of decreased attenuation are noted throughout the deep and periventricular white matter of the cerebral hemispheres bilaterally, compatible with chronic microvascular ischemic disease. No evidence of large-territorial acute infarction. No parenchymal hemorrhage. No mass lesion. No extra-axial collection. No mass effect or midline shift. No hydrocephalus. Basilar cisterns are patent. Vascular: No hyperdense vessel. Skull: No acute fracture or focal lesion. Atherosclerotic calcifications are present within the cavernous internal carotid arteries. Sinuses/Orbits: Almost complete opacification of bilateral maxillary sinuses. Associated maxillary wall mild hyperostosis. Remaining visualized paranasal sinuses and mastoid air cells are clear. Bilateral lens replacement. Otherwise orbits are unremarkable. Other: None. CT CERVICAL SPINE FINDINGS Alignment: Normal. Skull base and vertebrae: Multilevel degenerative changes of the spine. Associated severe osseous neural foraminal stenosis at the right C5-C6 level. As well as left C6-C7 level. No severe osseous central canal stenosis. No acute fracture. No aggressive appearing focal osseous lesion or focal pathologic process.  Soft tissues and spinal canal: No prevertebral fluid or swelling. No visible canal hematoma. Upper chest: Unremarkable. Other: None. IMPRESSION: 1. No acute intracranial abnormality. 2. No acute displaced fracture or traumatic listhesis of the cervical spine. 3. Multilevel degenerative change of the spine with associated severe osseous neural foraminal stenosis of the right C5-C6 and left C6-C7 levels. Electronically Signed   By: Iven Finn M.D.   On: 01/10/2021 16:46   CT CHEST ABDOMEN PELVIS W CONTRAST  Result Date: 01/10/2021 CLINICAL DATA:  Chest trauma, mod-severe, Low back pain, trauma, Chest discomfort EXAM: CT CHEST, ABDOMEN, AND PELVIS WITH CONTRAST TECHNIQUE: Multidetector CT imaging of the chest, abdomen and pelvis was performed following the standard protocol during bolus administration of intravenous contrast. CONTRAST:  125mL OMNIPAQUE IOHEXOL 300 MG/ML  SOLN COMPARISON:  None. FINDINGS: CHEST: Ports and Devices: None. Lungs/airways: Mild reticulations along the peripheral right lung and right lower lobe. No focal consolidation. Scattered punctate calcifications throughout the lungs. Right apical 8 mm ground-glass nodule (5:43). No pulmonary mass. No pulmonary contusion or laceration. No pneumatocele formation. The central airways are patent. Mild right lower lobe bronchial wall thickening. Pleura: No pleural effusion. No pneumothorax. No hemothorax. Lymph Nodes: No mediastinal, hilar, or axillary lymphadenopathy. Mediastinum: No pneumomediastinum. No aortic injury or mediastinal hematoma. The thoracic aorta is normal in caliber. At least mild calcified noncalcified atherosclerotic plaque.  At least 3 vessel coronary artery calcifications. The heart is normal in size. No significant pericardial effusion. The esophagus is unremarkable. The thyroid is unremarkable. Chest Wall / Breasts: No chest wall mass. Musculoskeletal: No acute rib or sternal fracture. No spinal fracture. ABDOMEN / PELVIS:  Liver: Not enlarged. No focal lesion. No laceration or subcapsular hematoma. Biliary System: The gallbladder is otherwise unremarkable with no radio-opaque gallstones. No biliary ductal dilatation. Pancreas: Normal pancreatic contour. No main pancreatic duct dilatation. Spleen: Couple splenules are noted. Not enlarged. No focal lesion. No laceration, subcapsular hematoma, or vascular injury. Adrenal Glands: No nodularity bilaterally. Kidneys: Bilateral kidneys enhance symmetrically. Subcentimeter hypodensities are too small characterize. No hydronephrosis. No contusion, laceration, or subcapsular hematoma. No injury to the vascular structures or collecting systems. No hydroureter. Circumferential urinary bladder wall thickening likely due to under distension. The urinary bladder is unremarkable. On delayed imaging, there is no urothelial wall thickening and there are no filling defects in the opacified portions of the bilateral collecting systems or ureters. Bowel: No small or large bowel wall thickening or dilatation. The appendix is unremarkable. Mesentery, Omentum, and Peritoneum: No simple free fluid ascites. No pneumoperitoneum. No hemoperitoneum. No mesenteric hematoma identified. No organized fluid collection. Pelvic Organs: Normal. Lymph Nodes: No abdominal, pelvic, inguinal lymphadenopathy. Vasculature: Moderate to severe atherosclerotic plaque. No abdominal aorta or iliac aneurysm. No active contrast extravasation or pseudoaneurysm. Musculoskeletal: No significant soft tissue hematoma. No acute pelvic fracture. No spinal fracture. Intramedullary nail fixation of the visualized proximal right femur. Intervertebral disc space vacuum phenomenon at the L5-S1 level. Posterior disc osteophyte complex at the L5-S1 level. IMPRESSION: 1. No acute traumatic injury to the chest, abdomen, or pelvis. 2. No acute fracture or traumatic malalignment of the thoracic or lumbar spine. 3. Sequelae of prior granulomatous  disease with associated trace pulmonary fibrosis. 4. Right apical 8 mm ground-glass nodule. Initial follow-up with CT at 6-12 months is recommended to confirm persistence. If persistent, repeat CT is recommended every 2 years until 5 years of stability has been established. This recommendation follows the consensus statement: Guidelines for Management of Incidental Pulmonary Nodules Detected on CT Images: From the Fleischner Society 2017; Radiology 2017; 284:228-243. 5. Aortic Atherosclerosis (ICD10-I70.0). Electronically Signed   By: Iven Finn M.D.   On: 01/10/2021 18:38     Discharge Exam: Vitals:   01/13/21 0320 01/13/21 0830  BP: 132/66 (!) 167/82  Pulse: 65 85  Resp:  20  Temp: 99 F (37.2 C) 98.8 F (37.1 C)  SpO2: 94% 96%   Vitals:   01/12/21 1952 01/12/21 2323 01/13/21 0320 01/13/21 0830  BP: (!) 162/72 (!) 160/72 132/66 (!) 167/82  Pulse: 77 77 65 85  Resp:    20  Temp: 98.4 F (36.9 C) 98.5 F (36.9 C) 99 F (37.2 C) 98.8 F (37.1 C)  TempSrc: Oral Oral Axillary Oral  SpO2: 96% 95% 94% 96%  Weight:      Height:        General: Pt is alert, awake, not in acute distress Cardiovascular: RRR, S1/S2 +, no rubs, no gallops Respiratory: CTA bilaterally, no wheezing, no rhonchi Abdominal: Soft, NT, ND, bowel sounds + Extremities: no edema, no cyanosis    The results of significant diagnostics from this hospitalization (including imaging, microbiology, ancillary and laboratory) are listed below for reference.     Microbiology: Recent Results (from the past 240 hour(s))  Resp Panel by RT-PCR (Flu A&B, Covid) Nasopharyngeal Swab     Status: None  Collection Time: 01/10/21  4:03 PM   Specimen: Nasopharyngeal Swab; Nasopharyngeal(NP) swabs in vial transport medium  Result Value Ref Range Status   SARS Coronavirus 2 by RT PCR NEGATIVE NEGATIVE Final    Comment: (NOTE) SARS-CoV-2 target nucleic acids are NOT DETECTED.  The SARS-CoV-2 RNA is generally detectable in  upper respiratory specimens during the acute phase of infection. The lowest concentration of SARS-CoV-2 viral copies this assay can detect is 138 copies/mL. A negative result does not preclude SARS-Cov-2 infection and should not be used as the sole basis for treatment or other patient management decisions. A negative result may occur with  improper specimen collection/handling, submission of specimen other than nasopharyngeal swab, presence of viral mutation(s) within the areas targeted by this assay, and inadequate number of viral copies(<138 copies/mL). A negative result must be combined with clinical observations, patient history, and epidemiological information. The expected result is Negative.  Fact Sheet for Patients:  EntrepreneurPulse.com.au  Fact Sheet for Healthcare Providers:  IncredibleEmployment.be  This test is no t yet approved or cleared by the [place] FDA and  has been authorized for detection and/or diagnosis of SARS-CoV-2 by FDA under an Emergency Use Authorization (EUA). This EUA will remain  in effect (meaning this test can be used) for the duration of the COVID-19 declaration under Section 564(b)(1) of the Act, 21 U.S.C.section 360bbb-3(b)(1), unless the authorization is terminated  or revoked sooner.       Influenza A by PCR NEGATIVE NEGATIVE Final   Influenza B by PCR NEGATIVE NEGATIVE Final    Comment: (NOTE) The Xpert Xpress SARS-CoV-2/FLU/RSV plus assay is intended as an aid in the diagnosis of influenza from Nasopharyngeal swab specimens and should not be used as a sole basis for treatment. Nasal washings and aspirates are unacceptable for Xpert Xpress SARS-CoV-2/FLU/RSV testing.  Fact Sheet for Patients: EntrepreneurPulse.com.au  Fact Sheet for Healthcare Providers: IncredibleEmployment.be  This test is not yet approved or cleared by the [place] FDA and has been  authorized for detection and/or diagnosis of SARS-CoV-2 by FDA under an Emergency Use Authorization (EUA). This EUA will remain in effect (meaning this test can be used) for the duration of the COVID-19 declaration under Section 564(b)(1) of the Act, 21 U.S.C. section 360bbb-3(b)(1), unless the authorization is terminated or revoked.  Performed at [facility], [place] [address], [place], [place] [address]   Urine Culture     Status: Abnormal   Collection Time: 01/10/21  4:06 PM   Specimen: Urine, Clean Catch  Result Value Ref Range Status   Specimen Description URINE, CLEAN CATCH  Final   Special Requests   Final    NONE Performed at [facility], [place] [address], [place], [place] [address]    Culture >=100,000 COLONIES/mL ESCHERICHIA COLI (A)  Final   Report Status 01/13/2021 FINAL  Final   Organism ID, Bacteria ESCHERICHIA COLI (A)  Final      Susceptibility   Escherichia coli - MIC*    AMPICILLIN <=2 SENSITIVE Sensitive     CEFAZOLIN <=4 SENSITIVE Sensitive     CEFEPIME <=0.12 SENSITIVE Sensitive     CEFTRIAXONE <=0.25 SENSITIVE Sensitive     CIPROFLOXACIN <=0.25 SENSITIVE Sensitive     GENTAMICIN <=1 SENSITIVE Sensitive     IMIPENEM <=0.25 SENSITIVE Sensitive     NITROFURANTOIN <=16 SENSITIVE Sensitive     TRIMETH/SULFA <=20 SENSITIVE Sensitive     AMPICILLIN/SULBACTAM <=2 SENSITIVE Sensitive     PIP/TAZO <=4 SENSITIVE Sensitive     * >=  100,000 COLONIES/mL ESCHERICHIA COLI  Blood culture (routine x 2)     Status: None (Preliminary result)   Collection Time: 01/10/21  4:18 PM   Specimen: BLOOD  Result Value Ref Range Status   Specimen Description BLOOD BLOOD LEFT FOREARM  Final   Special Requests   Final    BOTTLES DRAWN AEROBIC AND ANAEROBIC Blood Culture adequate volume   Culture   Final    NO GROWTH 3 DAYS Performed at [facility], [address] [place], [place] [address]    Report Status PENDING  Incomplete  Blood culture (routine x 2)      Status: None (Preliminary result)   Collection Time: 01/10/21  4:25 PM   Specimen: BLOOD  Result Value Ref Range Status   Specimen Description BLOOD BLOOD RIGHT FOREARM  Final   Special Requests   Final    BOTTLES DRAWN AEROBIC AND ANAEROBIC Blood Culture adequate volume   Culture   Final    NO GROWTH 3 DAYS Performed at [facility], [place] [address] [place], [place] [address]    Report Status PENDING  Incomplete     Labs: BNP (last 3 results) No results for input(s): BNP in the last 8760 hours. Basic Metabolic Panel: Recent Labs  Lab 01/10/21 1637 01/10/21 1642 01/11/21 0248 01/12/21 0143  NA 134* 136 135 135  K 4.0 4.0 4.4 3.6  CL 98 99 104 103  CO2 25  --  26 25  GLUCOSE 119* 116* 106* 95  BUN 19 20 16 12   CREATININE 1.44* 1.40* 1.25* 1.02  CALCIUM 9.0  --  8.3* 7.9*  MG  --   --  1.8 1.9  PHOS  --   --   --  2.4*   Liver Function Tests: Recent Labs  Lab 01/10/21 1637 01/11/21 0248 01/12/21 0143  AST 54* 53*  --   ALT 56* 53*  --   ALKPHOS 91 74  --   BILITOT 1.4* 1.1  --   PROT 7.6 6.3*  --   ALBUMIN 3.3* 2.6* 2.4*   No results for input(s): LIPASE, AMYLASE in the last 168 hours. No results for input(s): AMMONIA in the last 168 hours. CBC: Recent Labs  Lab 01/10/21 1637 01/10/21 1642 01/11/21 0248 01/12/21 0143  WBC 11.5*  --  9.9 7.9  NEUTROABS 9.0*  --  7.7 5.7  HGB 12.5* 11.2* 11.5* 9.8*  HCT 37.4* 33.0* 34.4* 29.7*  MCV 94.4  --  96.6 96.1  PLT 195  --  167 174   Cardiac Enzymes: No results for input(s): CKTOTAL, CKMB, CKMBINDEX, TROPONINI in the last 168 hours. BNP: Invalid input(s): POCBNP CBG: No results for input(s): GLUCAP in the last 168 hours. D-Dimer No results for input(s): DDIMER in the last 72 hours. Hgb A1c No results for input(s): HGBA1C in the last 72 hours. Lipid Profile No results for input(s): CHOL, HDL, LDLCALC, TRIG, CHOLHDL, LDLDIRECT in the last 72 hours. Thyroid function studies No results for input(s):  TSH, T4TOTAL, T3FREE, THYROIDAB in the last 72 hours.  Invalid input(s): FREET3 Anemia work up No results for input(s): VITAMINB12, FOLATE, FERRITIN, TIBC, IRON, RETICCTPCT in the last 72 hours. Urinalysis    Component Value Date/Time   COLORURINE AMBER (A) 01/10/2021 1606   APPEARANCEUR CLOUDY (A) 01/10/2021 1606   LABSPEC 1.020 01/10/2021 1606   PHURINE 5.0 01/10/2021 1606   GLUCOSEU NEGATIVE 01/10/2021 1606   HGBUR MODERATE (A) 01/10/2021 1606   BILIRUBINUR NEGATIVE 01/10/2021  [place] 01/10/2021 1606   PROTEINUR 100 (A) 01/10/2021 1606   NITRITE POSITIVE (A) 01/10/2021 1606   LEUKOCYTESUR LARGE (A) 01/10/2021 1606   Sepsis Labs Invalid input(s): PROCALCITONIN,  WBC,  LACTICIDVEN Microbiology Recent Results (from the past 240 hour(s))  Resp Panel by RT-PCR (Flu A&B, Covid) Nasopharyngeal Swab     Status: None   Collection Time: 01/10/21  4:03 PM   Specimen: Nasopharyngeal Swab; Nasopharyngeal(NP) swabs in vial transport medium  Result Value Ref Range Status   SARS Coronavirus 2 by RT PCR NEGATIVE NEGATIVE Final    Comment: (NOTE) SARS-CoV-2 target nucleic acids are NOT DETECTED.  The SARS-CoV-2 RNA is generally detectable in upper respiratory specimens during the acute phase of infection. The lowest concentration of SARS-CoV-2 viral copies this assay can detect is 138 copies/mL. A negative result does not preclude SARS-Cov-2 infection and should not be used as the sole basis for treatment or other patient management decisions. A negative result may occur with  improper specimen collection/handling, submission of specimen other than nasopharyngeal swab, presence of viral mutation(s) within the areas targeted by this assay, and inadequate number of viral copies(<138 copies/mL). A negative result must be combined with clinical observations, patient history, and epidemiological information. The expected result is Negative.  Fact Sheet for Patients:   EntrepreneurPulse.com.au  Fact Sheet for Healthcare Providers:  IncredibleEmployment.be  This test is no t yet approved or cleared by the [place] FDA and  has been authorized for detection and/or diagnosis of SARS-CoV-2 by FDA under an Emergency Use Authorization (EUA). This EUA will remain  in effect (meaning this test can be used) for the duration of the COVID-19 declaration under Section 564(b)(1) of the Act, 21 U.S.C.section 360bbb-3(b)(1), unless the authorization is terminated  or revoked sooner.       Influenza A by PCR NEGATIVE NEGATIVE Final   Influenza B by PCR NEGATIVE NEGATIVE Final    Comment: (NOTE) The Xpert Xpress SARS-CoV-2/FLU/RSV plus assay is intended as an aid in the diagnosis of influenza from Nasopharyngeal swab specimens and should not be used as a sole basis for treatment. Nasal washings and aspirates are unacceptable for Xpert Xpress SARS-CoV-2/FLU/RSV testing.  Fact Sheet for Patients: EntrepreneurPulse.com.au  Fact Sheet for Healthcare Providers: IncredibleEmployment.be  This test is not yet approved or cleared by the [place] FDA and has been authorized for detection and/or diagnosis of SARS-CoV-2 by FDA under an Emergency Use Authorization (EUA). This EUA will remain in effect (meaning this test can be used) for the duration of the COVID-19 declaration under Section 564(b)(1) of the Act, 21 U.S.C. section 360bbb-3(b)(1), unless the authorization is terminated or revoked.  Performed at [facility], [place] [address], [place], [place] [address]   Urine Culture     Status: Abnormal   Collection Time: 01/10/21  4:06 PM   Specimen: Urine, Clean Catch  Result Value Ref Range Status   Specimen Description URINE, CLEAN CATCH  Final   Special Requests   Final    NONE Performed at [facility], [place] [address] [place], [place] [address]    Culture  >=100,000 COLONIES/mL ESCHERICHIA COLI (A)  Final   Report Status 01/13/2021 FINAL  Final   Organism ID, Bacteria ESCHERICHIA COLI (A)  Final      Susceptibility   Escherichia coli - MIC*    AMPICILLIN <=2 SENSITIVE Sensitive     CEFAZOLIN <=4 SENSITIVE Sensitive     CEFEPIME <=0.12 SENSITIVE Sensitive  CEFTRIAXONE <=0.25 SENSITIVE Sensitive     CIPROFLOXACIN <=0.25 SENSITIVE Sensitive     GENTAMICIN <=1 SENSITIVE Sensitive     IMIPENEM <=0.25 SENSITIVE Sensitive     NITROFURANTOIN <=16 SENSITIVE Sensitive     TRIMETH/SULFA <=20 SENSITIVE Sensitive     AMPICILLIN/SULBACTAM <=2 SENSITIVE Sensitive     PIP/TAZO <=4 SENSITIVE Sensitive     * >=100,000 COLONIES/mL ESCHERICHIA COLI  Blood culture (routine x 2)     Status: None (Preliminary result)   Collection Time: 01/10/21  4:18 PM   Specimen: BLOOD  Result Value Ref Range Status   Specimen Description BLOOD BLOOD LEFT FOREARM  Final   Special Requests   Final    BOTTLES DRAWN AEROBIC AND ANAEROBIC Blood Culture adequate volume   Culture   Final    NO GROWTH 3 DAYS Performed at [facility], [address] [place], [place] [address]    Report Status PENDING  Incomplete  Blood culture (routine x 2)     Status: None (Preliminary result)   Collection Time: 01/10/21  4:25 PM   Specimen: BLOOD  Result Value Ref Range Status   Specimen Description BLOOD BLOOD RIGHT FOREARM  Final   Special Requests   Final    BOTTLES DRAWN AEROBIC AND ANAEROBIC Blood Culture adequate volume   Culture   Final    NO GROWTH 3 DAYS Performed at [facility], [place] [address] [place], [place] [address]    Report Status PENDING  Incomplete     Time coordinating discharge: Over 30 minutes  SIGNED:   Darliss Cheney, MD  [facility] 01/13/2021, 10:19 AM  If 7PM-7AM, please contact night-coverage [email or web address]

## 2021-01-13 NOTE — TOC Progression Note (Signed)
Transition of Care [facility]) - Progression Note    Patient Details  Name: HOUSTON ZAPIEN MRN: [id number] Date of Birth: Apr 18, 1950  Transition of [facility]) CM/SW Contact  Joanne Chars, LCSW Phone Number: 01/13/2021, 1:02 PM  Clinical Narrative:   CSW spoke with pt by phone regarding [facility] provider.  Pt did not indicate agency preference, discussed [facility] as in network provider and pt agreeable.  CSW spoke with [place] at [facility] who accepts referral.  CSW spoke with pt again by phone and confirmed that Alvis Lemmings will reach out to him to schedule.    Expected Discharge Plan: Home/Self Care Barriers to Discharge: Continued Medical Work up  Expected Discharge Plan and Services Expected Discharge Plan: Home/Self Care In-house Referral: NA Discharge Planning Services: CM Consult Post Acute Care Choice: NA Living arrangements for the past 2 months: Single Family Home Expected Discharge Date: 01/13/21               DME Arranged: N/A DME Agency: NA       HH Arranged: PT, OT HH Agency: [place] Date [facility] Agency Contacted: 01/13/21 Time [place]: [id number] Representative spoke with at [place]: [place] Interventions    Readmission Risk Interventions No flowsheet data found.

## 2021-01-14 DIAGNOSIS — I252 Old myocardial infarction: Secondary | ICD-10-CM | POA: Diagnosis not present

## 2021-01-14 DIAGNOSIS — N3000 Acute cystitis without hematuria: Secondary | ICD-10-CM | POA: Diagnosis not present

## 2021-01-14 DIAGNOSIS — I10 Essential (primary) hypertension: Secondary | ICD-10-CM | POA: Diagnosis not present

## 2021-01-14 DIAGNOSIS — B9620 Unspecified Escherichia coli [E. coli] as the cause of diseases classified elsewhere: Secondary | ICD-10-CM | POA: Diagnosis not present

## 2021-01-14 DIAGNOSIS — E782 Mixed hyperlipidemia: Secondary | ICD-10-CM | POA: Diagnosis not present

## 2021-01-14 DIAGNOSIS — F0284 Dementia in other diseases classified elsewhere, unspecified severity, with anxiety: Secondary | ICD-10-CM | POA: Diagnosis not present

## 2021-01-14 DIAGNOSIS — I2510 Atherosclerotic heart disease of native coronary artery without angina pectoris: Secondary | ICD-10-CM | POA: Diagnosis not present

## 2021-01-14 DIAGNOSIS — G309 Alzheimer's disease, unspecified: Secondary | ICD-10-CM | POA: Diagnosis not present

## 2021-01-14 DIAGNOSIS — A419 Sepsis, unspecified organism: Secondary | ICD-10-CM | POA: Diagnosis not present

## 2021-01-15 DIAGNOSIS — N3000 Acute cystitis without hematuria: Secondary | ICD-10-CM | POA: Diagnosis not present

## 2021-01-15 DIAGNOSIS — I10 Essential (primary) hypertension: Secondary | ICD-10-CM | POA: Diagnosis not present

## 2021-01-15 DIAGNOSIS — F0284 Dementia in other diseases classified elsewhere, unspecified severity, with anxiety: Secondary | ICD-10-CM | POA: Diagnosis not present

## 2021-01-15 DIAGNOSIS — I252 Old myocardial infarction: Secondary | ICD-10-CM | POA: Diagnosis not present

## 2021-01-15 DIAGNOSIS — G309 Alzheimer's disease, unspecified: Secondary | ICD-10-CM | POA: Diagnosis not present

## 2021-01-15 DIAGNOSIS — E782 Mixed hyperlipidemia: Secondary | ICD-10-CM | POA: Diagnosis not present

## 2021-01-15 DIAGNOSIS — A419 Sepsis, unspecified organism: Secondary | ICD-10-CM | POA: Diagnosis not present

## 2021-01-15 DIAGNOSIS — B9620 Unspecified Escherichia coli [E. coli] as the cause of diseases classified elsewhere: Secondary | ICD-10-CM | POA: Diagnosis not present

## 2021-01-15 DIAGNOSIS — I2510 Atherosclerotic heart disease of native coronary artery without angina pectoris: Secondary | ICD-10-CM | POA: Diagnosis not present

## 2021-01-15 LAB — CULTURE, BLOOD (ROUTINE X 2)
Culture: NO GROWTH
Culture: NO GROWTH
Special Requests: ADEQUATE
Special Requests: ADEQUATE

## 2021-01-18 DIAGNOSIS — R42 Dizziness and giddiness: Secondary | ICD-10-CM | POA: Diagnosis not present

## 2021-01-18 DIAGNOSIS — M1991 Primary osteoarthritis, unspecified site: Secondary | ICD-10-CM | POA: Diagnosis not present

## 2021-01-18 DIAGNOSIS — R29898 Other symptoms and signs involving the musculoskeletal system: Secondary | ICD-10-CM | POA: Diagnosis not present

## 2021-01-20 DIAGNOSIS — I10 Essential (primary) hypertension: Secondary | ICD-10-CM | POA: Diagnosis not present

## 2021-01-20 DIAGNOSIS — G894 Chronic pain syndrome: Secondary | ICD-10-CM | POA: Diagnosis not present

## 2021-01-20 DIAGNOSIS — I672 Cerebral atherosclerosis: Secondary | ICD-10-CM | POA: Diagnosis not present

## 2021-01-20 DIAGNOSIS — G309 Alzheimer's disease, unspecified: Secondary | ICD-10-CM | POA: Diagnosis not present

## 2021-01-20 DIAGNOSIS — B9620 Unspecified Escherichia coli [E. coli] as the cause of diseases classified elsewhere: Secondary | ICD-10-CM | POA: Diagnosis not present

## 2021-01-20 DIAGNOSIS — F0284 Dementia in other diseases classified elsewhere, unspecified severity, with anxiety: Secondary | ICD-10-CM | POA: Diagnosis not present

## 2021-01-20 DIAGNOSIS — I252 Old myocardial infarction: Secondary | ICD-10-CM | POA: Diagnosis not present

## 2021-01-20 DIAGNOSIS — I2510 Atherosclerotic heart disease of native coronary artery without angina pectoris: Secondary | ICD-10-CM | POA: Diagnosis not present

## 2021-01-20 DIAGNOSIS — N3000 Acute cystitis without hematuria: Secondary | ICD-10-CM | POA: Diagnosis not present

## 2021-01-20 DIAGNOSIS — A419 Sepsis, unspecified organism: Secondary | ICD-10-CM | POA: Diagnosis not present

## 2021-01-20 DIAGNOSIS — E782 Mixed hyperlipidemia: Secondary | ICD-10-CM | POA: Diagnosis not present

## 2021-01-20 DIAGNOSIS — K5080 Crohn's disease of both small and large intestine without complications: Secondary | ICD-10-CM | POA: Diagnosis not present

## 2021-01-22 DIAGNOSIS — I2510 Atherosclerotic heart disease of native coronary artery without angina pectoris: Secondary | ICD-10-CM | POA: Diagnosis not present

## 2021-01-22 DIAGNOSIS — G309 Alzheimer's disease, unspecified: Secondary | ICD-10-CM | POA: Diagnosis not present

## 2021-01-23 DIAGNOSIS — Z8673 Personal history of transient ischemic attack (TIA), and cerebral infarction without residual deficits: Secondary | ICD-10-CM | POA: Diagnosis not present

## 2021-01-23 DIAGNOSIS — I672 Cerebral atherosclerosis: Secondary | ICD-10-CM | POA: Diagnosis not present

## 2021-01-23 DIAGNOSIS — I10 Essential (primary) hypertension: Secondary | ICD-10-CM | POA: Diagnosis not present

## 2021-01-23 DIAGNOSIS — M461 Sacroiliitis, not elsewhere classified: Secondary | ICD-10-CM | POA: Diagnosis not present

## 2021-01-23 DIAGNOSIS — M1991 Primary osteoarthritis, unspecified site: Secondary | ICD-10-CM | POA: Diagnosis not present

## 2021-01-23 DIAGNOSIS — A419 Sepsis, unspecified organism: Secondary | ICD-10-CM | POA: Diagnosis not present

## 2021-01-23 DIAGNOSIS — Z8619 Personal history of other infectious and parasitic diseases: Secondary | ICD-10-CM | POA: Diagnosis not present

## 2021-01-23 DIAGNOSIS — E782 Mixed hyperlipidemia: Secondary | ICD-10-CM | POA: Diagnosis not present

## 2021-01-23 DIAGNOSIS — F0284 Dementia in other diseases classified elsewhere, unspecified severity, with anxiety: Secondary | ICD-10-CM | POA: Diagnosis not present

## 2021-01-23 DIAGNOSIS — B9620 Unspecified Escherichia coli [E. coli] as the cause of diseases classified elsewhere: Secondary | ICD-10-CM | POA: Diagnosis not present

## 2021-01-23 DIAGNOSIS — K5080 Crohn's disease of both small and large intestine without complications: Secondary | ICD-10-CM | POA: Diagnosis not present

## 2021-01-23 DIAGNOSIS — G894 Chronic pain syndrome: Secondary | ICD-10-CM | POA: Diagnosis not present

## 2021-01-23 DIAGNOSIS — I2510 Atherosclerotic heart disease of native coronary artery without angina pectoris: Secondary | ICD-10-CM | POA: Diagnosis not present

## 2021-01-23 DIAGNOSIS — G309 Alzheimer's disease, unspecified: Secondary | ICD-10-CM | POA: Diagnosis not present

## 2021-01-23 DIAGNOSIS — I252 Old myocardial infarction: Secondary | ICD-10-CM | POA: Diagnosis not present

## 2021-01-23 DIAGNOSIS — A498 Other bacterial infections of unspecified site: Secondary | ICD-10-CM | POA: Diagnosis not present

## 2021-01-23 DIAGNOSIS — N3000 Acute cystitis without hematuria: Secondary | ICD-10-CM | POA: Diagnosis not present

## 2021-01-23 DIAGNOSIS — F332 Major depressive disorder, recurrent severe without psychotic features: Secondary | ICD-10-CM | POA: Diagnosis not present

## 2021-01-23 DIAGNOSIS — R7881 Bacteremia: Secondary | ICD-10-CM | POA: Diagnosis not present

## 2021-01-24 DIAGNOSIS — I10 Essential (primary) hypertension: Secondary | ICD-10-CM | POA: Diagnosis not present

## 2021-01-24 DIAGNOSIS — A419 Sepsis, unspecified organism: Secondary | ICD-10-CM | POA: Diagnosis not present

## 2021-01-24 DIAGNOSIS — N3000 Acute cystitis without hematuria: Secondary | ICD-10-CM | POA: Diagnosis not present

## 2021-01-24 DIAGNOSIS — E782 Mixed hyperlipidemia: Secondary | ICD-10-CM | POA: Diagnosis not present

## 2021-01-24 DIAGNOSIS — I2510 Atherosclerotic heart disease of native coronary artery without angina pectoris: Secondary | ICD-10-CM | POA: Diagnosis not present

## 2021-01-24 DIAGNOSIS — B9620 Unspecified Escherichia coli [E. coli] as the cause of diseases classified elsewhere: Secondary | ICD-10-CM | POA: Diagnosis not present

## 2021-01-24 DIAGNOSIS — I252 Old myocardial infarction: Secondary | ICD-10-CM | POA: Diagnosis not present

## 2021-01-24 DIAGNOSIS — F0284 Dementia in other diseases classified elsewhere, unspecified severity, with anxiety: Secondary | ICD-10-CM | POA: Diagnosis not present

## 2021-01-24 DIAGNOSIS — G309 Alzheimer's disease, unspecified: Secondary | ICD-10-CM | POA: Diagnosis not present

## 2021-01-29 DIAGNOSIS — A419 Sepsis, unspecified organism: Secondary | ICD-10-CM | POA: Diagnosis not present

## 2021-01-29 DIAGNOSIS — B9620 Unspecified Escherichia coli [E. coli] as the cause of diseases classified elsewhere: Secondary | ICD-10-CM | POA: Diagnosis not present

## 2021-01-29 DIAGNOSIS — N3000 Acute cystitis without hematuria: Secondary | ICD-10-CM | POA: Diagnosis not present

## 2021-01-29 DIAGNOSIS — I2510 Atherosclerotic heart disease of native coronary artery without angina pectoris: Secondary | ICD-10-CM | POA: Diagnosis not present

## 2021-01-29 DIAGNOSIS — I10 Essential (primary) hypertension: Secondary | ICD-10-CM | POA: Diagnosis not present

## 2021-01-29 DIAGNOSIS — I252 Old myocardial infarction: Secondary | ICD-10-CM | POA: Diagnosis not present

## 2021-01-29 DIAGNOSIS — G309 Alzheimer's disease, unspecified: Secondary | ICD-10-CM | POA: Diagnosis not present

## 2021-01-29 DIAGNOSIS — F0284 Dementia in other diseases classified elsewhere, unspecified severity, with anxiety: Secondary | ICD-10-CM | POA: Diagnosis not present

## 2021-01-29 DIAGNOSIS — E782 Mixed hyperlipidemia: Secondary | ICD-10-CM | POA: Diagnosis not present

## 2021-01-30 DIAGNOSIS — I2510 Atherosclerotic heart disease of native coronary artery without angina pectoris: Secondary | ICD-10-CM | POA: Diagnosis not present

## 2021-01-30 DIAGNOSIS — I252 Old myocardial infarction: Secondary | ICD-10-CM | POA: Diagnosis not present

## 2021-01-30 DIAGNOSIS — F0284 Dementia in other diseases classified elsewhere, unspecified severity, with anxiety: Secondary | ICD-10-CM | POA: Diagnosis not present

## 2021-01-30 DIAGNOSIS — N3000 Acute cystitis without hematuria: Secondary | ICD-10-CM | POA: Diagnosis not present

## 2021-01-30 DIAGNOSIS — E782 Mixed hyperlipidemia: Secondary | ICD-10-CM | POA: Diagnosis not present

## 2021-01-30 DIAGNOSIS — B9620 Unspecified Escherichia coli [E. coli] as the cause of diseases classified elsewhere: Secondary | ICD-10-CM | POA: Diagnosis not present

## 2021-01-30 DIAGNOSIS — A419 Sepsis, unspecified organism: Secondary | ICD-10-CM | POA: Diagnosis not present

## 2021-01-30 DIAGNOSIS — I10 Essential (primary) hypertension: Secondary | ICD-10-CM | POA: Diagnosis not present

## 2021-01-30 DIAGNOSIS — G309 Alzheimer's disease, unspecified: Secondary | ICD-10-CM | POA: Diagnosis not present

## 2021-02-03 DIAGNOSIS — I2510 Atherosclerotic heart disease of native coronary artery without angina pectoris: Secondary | ICD-10-CM | POA: Diagnosis not present

## 2021-02-03 DIAGNOSIS — I252 Old myocardial infarction: Secondary | ICD-10-CM | POA: Diagnosis not present

## 2021-02-03 DIAGNOSIS — N3000 Acute cystitis without hematuria: Secondary | ICD-10-CM | POA: Diagnosis not present

## 2021-02-03 DIAGNOSIS — E782 Mixed hyperlipidemia: Secondary | ICD-10-CM | POA: Diagnosis not present

## 2021-02-03 DIAGNOSIS — F0284 Dementia in other diseases classified elsewhere, unspecified severity, with anxiety: Secondary | ICD-10-CM | POA: Diagnosis not present

## 2021-02-03 DIAGNOSIS — I10 Essential (primary) hypertension: Secondary | ICD-10-CM | POA: Diagnosis not present

## 2021-02-03 DIAGNOSIS — B9620 Unspecified Escherichia coli [E. coli] as the cause of diseases classified elsewhere: Secondary | ICD-10-CM | POA: Diagnosis not present

## 2021-02-03 DIAGNOSIS — A419 Sepsis, unspecified organism: Secondary | ICD-10-CM | POA: Diagnosis not present

## 2021-02-03 DIAGNOSIS — G309 Alzheimer's disease, unspecified: Secondary | ICD-10-CM | POA: Diagnosis not present

## 2021-02-06 DIAGNOSIS — B9620 Unspecified Escherichia coli [E. coli] as the cause of diseases classified elsewhere: Secondary | ICD-10-CM | POA: Diagnosis not present

## 2021-02-06 DIAGNOSIS — A419 Sepsis, unspecified organism: Secondary | ICD-10-CM | POA: Diagnosis not present

## 2021-02-06 DIAGNOSIS — N3000 Acute cystitis without hematuria: Secondary | ICD-10-CM | POA: Diagnosis not present

## 2021-02-06 DIAGNOSIS — M5136 Other intervertebral disc degeneration, lumbar region: Secondary | ICD-10-CM | POA: Diagnosis not present

## 2021-02-06 DIAGNOSIS — I10 Essential (primary) hypertension: Secondary | ICD-10-CM | POA: Diagnosis not present

## 2021-02-06 DIAGNOSIS — I2510 Atherosclerotic heart disease of native coronary artery without angina pectoris: Secondary | ICD-10-CM | POA: Diagnosis not present

## 2021-02-06 DIAGNOSIS — G309 Alzheimer's disease, unspecified: Secondary | ICD-10-CM | POA: Diagnosis not present

## 2021-02-06 DIAGNOSIS — M545 Low back pain, unspecified: Secondary | ICD-10-CM | POA: Diagnosis not present

## 2021-02-06 DIAGNOSIS — I252 Old myocardial infarction: Secondary | ICD-10-CM | POA: Diagnosis not present

## 2021-02-06 DIAGNOSIS — E782 Mixed hyperlipidemia: Secondary | ICD-10-CM | POA: Diagnosis not present

## 2021-02-06 DIAGNOSIS — F0284 Dementia in other diseases classified elsewhere, unspecified severity, with anxiety: Secondary | ICD-10-CM | POA: Diagnosis not present

## 2021-02-10 DIAGNOSIS — A419 Sepsis, unspecified organism: Secondary | ICD-10-CM | POA: Diagnosis not present

## 2021-02-10 DIAGNOSIS — E782 Mixed hyperlipidemia: Secondary | ICD-10-CM | POA: Diagnosis not present

## 2021-02-10 DIAGNOSIS — G309 Alzheimer's disease, unspecified: Secondary | ICD-10-CM | POA: Diagnosis not present

## 2021-02-10 DIAGNOSIS — F0284 Dementia in other diseases classified elsewhere, unspecified severity, with anxiety: Secondary | ICD-10-CM | POA: Diagnosis not present

## 2021-02-10 DIAGNOSIS — I10 Essential (primary) hypertension: Secondary | ICD-10-CM | POA: Diagnosis not present

## 2021-02-10 DIAGNOSIS — B9620 Unspecified Escherichia coli [E. coli] as the cause of diseases classified elsewhere: Secondary | ICD-10-CM | POA: Diagnosis not present

## 2021-02-10 DIAGNOSIS — N3000 Acute cystitis without hematuria: Secondary | ICD-10-CM | POA: Diagnosis not present

## 2021-02-10 DIAGNOSIS — I2510 Atherosclerotic heart disease of native coronary artery without angina pectoris: Secondary | ICD-10-CM | POA: Diagnosis not present

## 2021-02-10 DIAGNOSIS — I252 Old myocardial infarction: Secondary | ICD-10-CM | POA: Diagnosis not present

## 2021-02-11 DIAGNOSIS — N3000 Acute cystitis without hematuria: Secondary | ICD-10-CM | POA: Diagnosis not present

## 2021-02-11 DIAGNOSIS — I10 Essential (primary) hypertension: Secondary | ICD-10-CM | POA: Diagnosis not present

## 2021-02-11 DIAGNOSIS — E782 Mixed hyperlipidemia: Secondary | ICD-10-CM | POA: Diagnosis not present

## 2021-02-11 DIAGNOSIS — A419 Sepsis, unspecified organism: Secondary | ICD-10-CM | POA: Diagnosis not present

## 2021-02-11 DIAGNOSIS — I252 Old myocardial infarction: Secondary | ICD-10-CM | POA: Diagnosis not present

## 2021-02-11 DIAGNOSIS — B9620 Unspecified Escherichia coli [E. coli] as the cause of diseases classified elsewhere: Secondary | ICD-10-CM | POA: Diagnosis not present

## 2021-02-11 DIAGNOSIS — G309 Alzheimer's disease, unspecified: Secondary | ICD-10-CM | POA: Diagnosis not present

## 2021-02-11 DIAGNOSIS — I2510 Atherosclerotic heart disease of native coronary artery without angina pectoris: Secondary | ICD-10-CM | POA: Diagnosis not present

## 2021-02-11 DIAGNOSIS — F0284 Dementia in other diseases classified elsewhere, unspecified severity, with anxiety: Secondary | ICD-10-CM | POA: Diagnosis not present

## 2021-02-17 DIAGNOSIS — I2510 Atherosclerotic heart disease of native coronary artery without angina pectoris: Secondary | ICD-10-CM | POA: Diagnosis not present

## 2021-02-17 DIAGNOSIS — G309 Alzheimer's disease, unspecified: Secondary | ICD-10-CM | POA: Diagnosis not present

## 2021-02-17 DIAGNOSIS — A419 Sepsis, unspecified organism: Secondary | ICD-10-CM | POA: Diagnosis not present

## 2021-02-17 DIAGNOSIS — B9620 Unspecified Escherichia coli [E. coli] as the cause of diseases classified elsewhere: Secondary | ICD-10-CM | POA: Diagnosis not present

## 2021-02-17 DIAGNOSIS — F0284 Dementia in other diseases classified elsewhere, unspecified severity, with anxiety: Secondary | ICD-10-CM | POA: Diagnosis not present

## 2021-02-17 DIAGNOSIS — E782 Mixed hyperlipidemia: Secondary | ICD-10-CM | POA: Diagnosis not present

## 2021-02-17 DIAGNOSIS — I252 Old myocardial infarction: Secondary | ICD-10-CM | POA: Diagnosis not present

## 2021-02-17 DIAGNOSIS — N3000 Acute cystitis without hematuria: Secondary | ICD-10-CM | POA: Diagnosis not present

## 2021-02-17 DIAGNOSIS — I10 Essential (primary) hypertension: Secondary | ICD-10-CM | POA: Diagnosis not present

## 2021-02-19 DIAGNOSIS — I2510 Atherosclerotic heart disease of native coronary artery without angina pectoris: Secondary | ICD-10-CM | POA: Diagnosis not present

## 2021-02-19 DIAGNOSIS — A419 Sepsis, unspecified organism: Secondary | ICD-10-CM | POA: Diagnosis not present

## 2021-02-19 DIAGNOSIS — N3000 Acute cystitis without hematuria: Secondary | ICD-10-CM | POA: Diagnosis not present

## 2021-02-19 DIAGNOSIS — I10 Essential (primary) hypertension: Secondary | ICD-10-CM | POA: Diagnosis not present

## 2021-02-19 DIAGNOSIS — I252 Old myocardial infarction: Secondary | ICD-10-CM | POA: Diagnosis not present

## 2021-02-19 DIAGNOSIS — B9620 Unspecified Escherichia coli [E. coli] as the cause of diseases classified elsewhere: Secondary | ICD-10-CM | POA: Diagnosis not present

## 2021-02-19 DIAGNOSIS — E782 Mixed hyperlipidemia: Secondary | ICD-10-CM | POA: Diagnosis not present

## 2021-02-19 DIAGNOSIS — G309 Alzheimer's disease, unspecified: Secondary | ICD-10-CM | POA: Diagnosis not present

## 2021-02-19 DIAGNOSIS — F0284 Dementia in other diseases classified elsewhere, unspecified severity, with anxiety: Secondary | ICD-10-CM | POA: Diagnosis not present

## 2021-02-24 DIAGNOSIS — M47816 Spondylosis without myelopathy or radiculopathy, lumbar region: Secondary | ICD-10-CM | POA: Diagnosis not present

## 2021-02-24 DIAGNOSIS — Z1389 Encounter for screening for other disorder: Secondary | ICD-10-CM | POA: Diagnosis not present

## 2021-02-24 DIAGNOSIS — G894 Chronic pain syndrome: Secondary | ICD-10-CM | POA: Diagnosis not present

## 2021-02-24 DIAGNOSIS — M5136 Other intervertebral disc degeneration, lumbar region: Secondary | ICD-10-CM | POA: Diagnosis not present

## 2021-02-24 DIAGNOSIS — M549 Dorsalgia, unspecified: Secondary | ICD-10-CM | POA: Diagnosis not present

## 2021-02-28 DIAGNOSIS — M1991 Primary osteoarthritis, unspecified site: Secondary | ICD-10-CM | POA: Diagnosis not present

## 2021-02-28 DIAGNOSIS — E663 Overweight: Secondary | ICD-10-CM | POA: Diagnosis not present

## 2021-02-28 DIAGNOSIS — Z6827 Body mass index (BMI) 27.0-27.9, adult: Secondary | ICD-10-CM | POA: Diagnosis not present

## 2021-02-28 DIAGNOSIS — I672 Cerebral atherosclerosis: Secondary | ICD-10-CM | POA: Diagnosis not present

## 2021-02-28 DIAGNOSIS — G894 Chronic pain syndrome: Secondary | ICD-10-CM | POA: Diagnosis not present

## 2021-03-03 DIAGNOSIS — F332 Major depressive disorder, recurrent severe without psychotic features: Secondary | ICD-10-CM | POA: Diagnosis not present

## 2021-03-11 DIAGNOSIS — Z87891 Personal history of nicotine dependence: Secondary | ICD-10-CM | POA: Diagnosis not present

## 2021-03-11 DIAGNOSIS — U071 COVID-19: Secondary | ICD-10-CM | POA: Diagnosis not present

## 2021-03-11 DIAGNOSIS — I10 Essential (primary) hypertension: Secondary | ICD-10-CM | POA: Diagnosis not present

## 2021-03-11 DIAGNOSIS — I2510 Atherosclerotic heart disease of native coronary artery without angina pectoris: Secondary | ICD-10-CM | POA: Diagnosis not present

## 2021-03-11 DIAGNOSIS — E782 Mixed hyperlipidemia: Secondary | ICD-10-CM | POA: Diagnosis not present

## 2021-03-11 DIAGNOSIS — Z7982 Long term (current) use of aspirin: Secondary | ICD-10-CM | POA: Diagnosis not present

## 2021-03-14 DIAGNOSIS — G894 Chronic pain syndrome: Secondary | ICD-10-CM | POA: Diagnosis not present

## 2021-03-14 DIAGNOSIS — M549 Dorsalgia, unspecified: Secondary | ICD-10-CM | POA: Diagnosis not present

## 2021-03-14 DIAGNOSIS — M5136 Other intervertebral disc degeneration, lumbar region: Secondary | ICD-10-CM | POA: Diagnosis not present

## 2021-03-14 DIAGNOSIS — M47816 Spondylosis without myelopathy or radiculopathy, lumbar region: Secondary | ICD-10-CM | POA: Diagnosis not present

## 2021-03-14 DIAGNOSIS — Z1389 Encounter for screening for other disorder: Secondary | ICD-10-CM | POA: Diagnosis not present

## 2021-04-02 DIAGNOSIS — F332 Major depressive disorder, recurrent severe without psychotic features: Secondary | ICD-10-CM | POA: Diagnosis not present

## 2021-04-04 DIAGNOSIS — K5080 Crohn's disease of both small and large intestine without complications: Secondary | ICD-10-CM | POA: Diagnosis not present

## 2021-04-04 DIAGNOSIS — L600 Ingrowing nail: Secondary | ICD-10-CM | POA: Diagnosis not present

## 2021-04-04 DIAGNOSIS — F332 Major depressive disorder, recurrent severe without psychotic features: Secondary | ICD-10-CM | POA: Diagnosis not present

## 2021-04-04 DIAGNOSIS — M461 Sacroiliitis, not elsewhere classified: Secondary | ICD-10-CM | POA: Diagnosis not present

## 2021-04-04 DIAGNOSIS — M7551 Bursitis of right shoulder: Secondary | ICD-10-CM | POA: Diagnosis not present

## 2021-04-04 DIAGNOSIS — L608 Other nail disorders: Secondary | ICD-10-CM | POA: Diagnosis not present

## 2021-04-04 DIAGNOSIS — G894 Chronic pain syndrome: Secondary | ICD-10-CM | POA: Diagnosis not present

## 2021-04-04 DIAGNOSIS — E663 Overweight: Secondary | ICD-10-CM | POA: Diagnosis not present

## 2021-04-04 DIAGNOSIS — M1991 Primary osteoarthritis, unspecified site: Secondary | ICD-10-CM | POA: Diagnosis not present

## 2021-04-11 DIAGNOSIS — M5136 Other intervertebral disc degeneration, lumbar region: Secondary | ICD-10-CM | POA: Diagnosis not present

## 2021-04-11 DIAGNOSIS — M549 Dorsalgia, unspecified: Secondary | ICD-10-CM | POA: Diagnosis not present

## 2021-04-11 DIAGNOSIS — M47816 Spondylosis without myelopathy or radiculopathy, lumbar region: Secondary | ICD-10-CM | POA: Diagnosis not present

## 2021-04-11 DIAGNOSIS — Z1389 Encounter for screening for other disorder: Secondary | ICD-10-CM | POA: Diagnosis not present

## 2021-04-11 DIAGNOSIS — G894 Chronic pain syndrome: Secondary | ICD-10-CM | POA: Diagnosis not present

## 2021-04-21 DIAGNOSIS — J329 Chronic sinusitis, unspecified: Secondary | ICD-10-CM | POA: Diagnosis not present

## 2021-04-21 DIAGNOSIS — G894 Chronic pain syndrome: Secondary | ICD-10-CM | POA: Diagnosis not present

## 2021-04-21 DIAGNOSIS — Z20828 Contact with and (suspected) exposure to other viral communicable diseases: Secondary | ICD-10-CM | POA: Diagnosis not present

## 2021-04-21 DIAGNOSIS — Z6827 Body mass index (BMI) 27.0-27.9, adult: Secondary | ICD-10-CM | POA: Diagnosis not present

## 2021-04-21 DIAGNOSIS — J40 Bronchitis, not specified as acute or chronic: Secondary | ICD-10-CM | POA: Diagnosis not present

## 2021-05-09 DIAGNOSIS — M47816 Spondylosis without myelopathy or radiculopathy, lumbar region: Secondary | ICD-10-CM | POA: Diagnosis not present

## 2021-05-09 DIAGNOSIS — M5136 Other intervertebral disc degeneration, lumbar region: Secondary | ICD-10-CM | POA: Diagnosis not present

## 2021-05-09 DIAGNOSIS — Z1389 Encounter for screening for other disorder: Secondary | ICD-10-CM | POA: Diagnosis not present

## 2021-05-09 DIAGNOSIS — M549 Dorsalgia, unspecified: Secondary | ICD-10-CM | POA: Diagnosis not present

## 2021-05-09 DIAGNOSIS — G894 Chronic pain syndrome: Secondary | ICD-10-CM | POA: Diagnosis not present

## 2021-05-15 DIAGNOSIS — M48061 Spinal stenosis, lumbar region without neurogenic claudication: Secondary | ICD-10-CM | POA: Diagnosis not present

## 2021-05-15 DIAGNOSIS — M47816 Spondylosis without myelopathy or radiculopathy, lumbar region: Secondary | ICD-10-CM | POA: Diagnosis not present

## 2021-05-15 DIAGNOSIS — M5126 Other intervertebral disc displacement, lumbar region: Secondary | ICD-10-CM | POA: Diagnosis not present

## 2021-05-27 DIAGNOSIS — L600 Ingrowing nail: Secondary | ICD-10-CM | POA: Diagnosis not present

## 2021-06-04 DIAGNOSIS — F332 Major depressive disorder, recurrent severe without psychotic features: Secondary | ICD-10-CM | POA: Diagnosis not present

## 2021-06-06 DIAGNOSIS — M47816 Spondylosis without myelopathy or radiculopathy, lumbar region: Secondary | ICD-10-CM | POA: Diagnosis not present

## 2021-06-06 DIAGNOSIS — M5136 Other intervertebral disc degeneration, lumbar region: Secondary | ICD-10-CM | POA: Diagnosis not present

## 2021-06-06 DIAGNOSIS — G894 Chronic pain syndrome: Secondary | ICD-10-CM | POA: Diagnosis not present

## 2021-06-06 DIAGNOSIS — M549 Dorsalgia, unspecified: Secondary | ICD-10-CM | POA: Diagnosis not present

## 2021-06-06 DIAGNOSIS — Z1389 Encounter for screening for other disorder: Secondary | ICD-10-CM | POA: Diagnosis not present

## 2021-06-10 DIAGNOSIS — L600 Ingrowing nail: Secondary | ICD-10-CM | POA: Diagnosis not present

## 2021-07-01 DIAGNOSIS — M5416 Radiculopathy, lumbar region: Secondary | ICD-10-CM | POA: Diagnosis not present

## 2021-07-04 DIAGNOSIS — G894 Chronic pain syndrome: Secondary | ICD-10-CM | POA: Diagnosis not present

## 2021-07-04 DIAGNOSIS — Z1389 Encounter for screening for other disorder: Secondary | ICD-10-CM | POA: Diagnosis not present

## 2021-07-04 DIAGNOSIS — M5136 Other intervertebral disc degeneration, lumbar region: Secondary | ICD-10-CM | POA: Diagnosis not present

## 2021-07-04 DIAGNOSIS — M549 Dorsalgia, unspecified: Secondary | ICD-10-CM | POA: Diagnosis not present

## 2021-07-04 DIAGNOSIS — M47816 Spondylosis without myelopathy or radiculopathy, lumbar region: Secondary | ICD-10-CM | POA: Diagnosis not present

## 2021-08-01 DIAGNOSIS — G894 Chronic pain syndrome: Secondary | ICD-10-CM | POA: Diagnosis not present

## 2021-08-01 DIAGNOSIS — M549 Dorsalgia, unspecified: Secondary | ICD-10-CM | POA: Diagnosis not present

## 2021-08-01 DIAGNOSIS — Z1389 Encounter for screening for other disorder: Secondary | ICD-10-CM | POA: Diagnosis not present

## 2021-08-01 DIAGNOSIS — M5136 Other intervertebral disc degeneration, lumbar region: Secondary | ICD-10-CM | POA: Diagnosis not present

## 2021-08-01 DIAGNOSIS — M47816 Spondylosis without myelopathy or radiculopathy, lumbar region: Secondary | ICD-10-CM | POA: Diagnosis not present

## 2021-08-28 DIAGNOSIS — M549 Dorsalgia, unspecified: Secondary | ICD-10-CM | POA: Diagnosis not present

## 2021-08-28 DIAGNOSIS — M47816 Spondylosis without myelopathy or radiculopathy, lumbar region: Secondary | ICD-10-CM | POA: Diagnosis not present

## 2021-08-28 DIAGNOSIS — M5136 Other intervertebral disc degeneration, lumbar region: Secondary | ICD-10-CM | POA: Diagnosis not present

## 2021-08-28 DIAGNOSIS — Z1389 Encounter for screening for other disorder: Secondary | ICD-10-CM | POA: Diagnosis not present

## 2021-08-28 DIAGNOSIS — G894 Chronic pain syndrome: Secondary | ICD-10-CM | POA: Diagnosis not present

## 2021-09-29 DIAGNOSIS — R7301 Impaired fasting glucose: Secondary | ICD-10-CM | POA: Diagnosis not present

## 2021-09-29 DIAGNOSIS — R0609 Other forms of dyspnea: Secondary | ICD-10-CM | POA: Diagnosis not present

## 2021-09-29 DIAGNOSIS — Z0000 Encounter for general adult medical examination without abnormal findings: Secondary | ICD-10-CM | POA: Diagnosis not present

## 2021-09-29 DIAGNOSIS — I672 Cerebral atherosclerosis: Secondary | ICD-10-CM | POA: Diagnosis not present

## 2021-09-29 DIAGNOSIS — Z79899 Other long term (current) drug therapy: Secondary | ICD-10-CM | POA: Diagnosis not present

## 2021-09-29 DIAGNOSIS — E039 Hypothyroidism, unspecified: Secondary | ICD-10-CM | POA: Diagnosis not present

## 2021-09-29 DIAGNOSIS — F332 Major depressive disorder, recurrent severe without psychotic features: Secondary | ICD-10-CM | POA: Diagnosis not present

## 2021-09-29 DIAGNOSIS — Z125 Encounter for screening for malignant neoplasm of prostate: Secondary | ICD-10-CM | POA: Diagnosis not present

## 2021-09-29 DIAGNOSIS — E785 Hyperlipidemia, unspecified: Secondary | ICD-10-CM | POA: Diagnosis not present

## 2021-09-29 DIAGNOSIS — I10 Essential (primary) hypertension: Secondary | ICD-10-CM | POA: Diagnosis not present

## 2021-10-06 DIAGNOSIS — I340 Nonrheumatic mitral (valve) insufficiency: Secondary | ICD-10-CM | POA: Diagnosis not present

## 2021-10-06 DIAGNOSIS — R0609 Other forms of dyspnea: Secondary | ICD-10-CM | POA: Diagnosis not present

## 2021-10-06 DIAGNOSIS — I361 Nonrheumatic tricuspid (valve) insufficiency: Secondary | ICD-10-CM | POA: Diagnosis not present

## 2021-10-07 DIAGNOSIS — Z1389 Encounter for screening for other disorder: Secondary | ICD-10-CM | POA: Diagnosis not present

## 2021-10-07 DIAGNOSIS — M549 Dorsalgia, unspecified: Secondary | ICD-10-CM | POA: Diagnosis not present

## 2021-10-07 DIAGNOSIS — M47816 Spondylosis without myelopathy or radiculopathy, lumbar region: Secondary | ICD-10-CM | POA: Diagnosis not present

## 2021-10-07 DIAGNOSIS — G894 Chronic pain syndrome: Secondary | ICD-10-CM | POA: Diagnosis not present

## 2021-10-07 DIAGNOSIS — M5136 Other intervertebral disc degeneration, lumbar region: Secondary | ICD-10-CM | POA: Diagnosis not present

## 2021-11-14 DIAGNOSIS — M47816 Spondylosis without myelopathy or radiculopathy, lumbar region: Secondary | ICD-10-CM | POA: Diagnosis not present

## 2021-11-14 DIAGNOSIS — Z1389 Encounter for screening for other disorder: Secondary | ICD-10-CM | POA: Diagnosis not present

## 2021-11-14 DIAGNOSIS — M549 Dorsalgia, unspecified: Secondary | ICD-10-CM | POA: Diagnosis not present

## 2021-11-14 DIAGNOSIS — G894 Chronic pain syndrome: Secondary | ICD-10-CM | POA: Diagnosis not present

## 2021-11-14 DIAGNOSIS — M5136 Other intervertebral disc degeneration, lumbar region: Secondary | ICD-10-CM | POA: Diagnosis not present

## 2021-11-26 DIAGNOSIS — Z6825 Body mass index (BMI) 25.0-25.9, adult: Secondary | ICD-10-CM | POA: Diagnosis not present

## 2021-11-26 DIAGNOSIS — R197 Diarrhea, unspecified: Secondary | ICD-10-CM | POA: Diagnosis not present

## 2021-11-26 DIAGNOSIS — K5080 Crohn's disease of both small and large intestine without complications: Secondary | ICD-10-CM | POA: Diagnosis not present

## 2021-12-11 DIAGNOSIS — M47816 Spondylosis without myelopathy or radiculopathy, lumbar region: Secondary | ICD-10-CM | POA: Diagnosis not present

## 2021-12-11 DIAGNOSIS — G894 Chronic pain syndrome: Secondary | ICD-10-CM | POA: Diagnosis not present

## 2021-12-11 DIAGNOSIS — M549 Dorsalgia, unspecified: Secondary | ICD-10-CM | POA: Diagnosis not present

## 2021-12-11 DIAGNOSIS — Z1389 Encounter for screening for other disorder: Secondary | ICD-10-CM | POA: Diagnosis not present

## 2021-12-11 DIAGNOSIS — M5136 Other intervertebral disc degeneration, lumbar region: Secondary | ICD-10-CM | POA: Diagnosis not present

## 2021-12-30 DIAGNOSIS — G894 Chronic pain syndrome: Secondary | ICD-10-CM | POA: Diagnosis not present

## 2021-12-30 DIAGNOSIS — E785 Hyperlipidemia, unspecified: Secondary | ICD-10-CM | POA: Diagnosis not present

## 2021-12-30 DIAGNOSIS — I672 Cerebral atherosclerosis: Secondary | ICD-10-CM | POA: Diagnosis not present

## 2021-12-30 DIAGNOSIS — I700 Atherosclerosis of aorta: Secondary | ICD-10-CM | POA: Diagnosis not present

## 2021-12-30 DIAGNOSIS — K5080 Crohn's disease of both small and large intestine without complications: Secondary | ICD-10-CM | POA: Diagnosis not present

## 2021-12-30 DIAGNOSIS — I10 Essential (primary) hypertension: Secondary | ICD-10-CM | POA: Diagnosis not present

## 2021-12-30 DIAGNOSIS — F411 Generalized anxiety disorder: Secondary | ICD-10-CM | POA: Diagnosis not present

## 2021-12-30 DIAGNOSIS — M1991 Primary osteoarthritis, unspecified site: Secondary | ICD-10-CM | POA: Diagnosis not present

## 2021-12-30 DIAGNOSIS — E039 Hypothyroidism, unspecified: Secondary | ICD-10-CM | POA: Diagnosis not present

## 2021-12-31 DIAGNOSIS — G894 Chronic pain syndrome: Secondary | ICD-10-CM | POA: Diagnosis not present

## 2021-12-31 DIAGNOSIS — Z1389 Encounter for screening for other disorder: Secondary | ICD-10-CM | POA: Diagnosis not present

## 2021-12-31 DIAGNOSIS — M549 Dorsalgia, unspecified: Secondary | ICD-10-CM | POA: Diagnosis not present

## 2021-12-31 DIAGNOSIS — M5136 Other intervertebral disc degeneration, lumbar region: Secondary | ICD-10-CM | POA: Diagnosis not present

## 2021-12-31 DIAGNOSIS — M47816 Spondylosis without myelopathy or radiculopathy, lumbar region: Secondary | ICD-10-CM | POA: Diagnosis not present

## 2022-01-13 DIAGNOSIS — M549 Dorsalgia, unspecified: Secondary | ICD-10-CM | POA: Diagnosis not present

## 2022-01-13 DIAGNOSIS — G894 Chronic pain syndrome: Secondary | ICD-10-CM | POA: Diagnosis not present

## 2022-01-13 DIAGNOSIS — Z1389 Encounter for screening for other disorder: Secondary | ICD-10-CM | POA: Diagnosis not present

## 2022-01-13 DIAGNOSIS — M5136 Other intervertebral disc degeneration, lumbar region: Secondary | ICD-10-CM | POA: Diagnosis not present

## 2022-01-13 DIAGNOSIS — M47816 Spondylosis without myelopathy or radiculopathy, lumbar region: Secondary | ICD-10-CM | POA: Diagnosis not present

## 2022-01-16 DIAGNOSIS — Z8619 Personal history of other infectious and parasitic diseases: Secondary | ICD-10-CM | POA: Diagnosis not present

## 2022-01-16 DIAGNOSIS — R5381 Other malaise: Secondary | ICD-10-CM | POA: Diagnosis not present

## 2022-01-16 DIAGNOSIS — R5383 Other fatigue: Secondary | ICD-10-CM | POA: Diagnosis not present

## 2022-01-16 DIAGNOSIS — I951 Orthostatic hypotension: Secondary | ICD-10-CM | POA: Diagnosis not present

## 2022-01-16 DIAGNOSIS — R0609 Other forms of dyspnea: Secondary | ICD-10-CM | POA: Diagnosis not present

## 2022-01-16 DIAGNOSIS — Z6825 Body mass index (BMI) 25.0-25.9, adult: Secondary | ICD-10-CM | POA: Diagnosis not present

## 2022-01-19 DIAGNOSIS — I672 Cerebral atherosclerosis: Secondary | ICD-10-CM | POA: Diagnosis not present

## 2022-01-19 DIAGNOSIS — E663 Overweight: Secondary | ICD-10-CM | POA: Diagnosis not present

## 2022-01-19 DIAGNOSIS — Z8673 Personal history of transient ischemic attack (TIA), and cerebral infarction without residual deficits: Secondary | ICD-10-CM | POA: Diagnosis not present

## 2022-01-19 DIAGNOSIS — F332 Major depressive disorder, recurrent severe without psychotic features: Secondary | ICD-10-CM | POA: Diagnosis not present

## 2022-01-19 DIAGNOSIS — Z6825 Body mass index (BMI) 25.0-25.9, adult: Secondary | ICD-10-CM | POA: Diagnosis not present

## 2022-01-19 DIAGNOSIS — K5080 Crohn's disease of both small and large intestine without complications: Secondary | ICD-10-CM | POA: Diagnosis not present

## 2022-01-19 DIAGNOSIS — E538 Deficiency of other specified B group vitamins: Secondary | ICD-10-CM | POA: Diagnosis not present

## 2022-01-19 DIAGNOSIS — I951 Orthostatic hypotension: Secondary | ICD-10-CM | POA: Diagnosis not present

## 2022-01-26 DIAGNOSIS — E538 Deficiency of other specified B group vitamins: Secondary | ICD-10-CM | POA: Diagnosis not present

## 2022-01-28 DIAGNOSIS — F332 Major depressive disorder, recurrent severe without psychotic features: Secondary | ICD-10-CM | POA: Diagnosis not present

## 2022-02-02 DIAGNOSIS — E538 Deficiency of other specified B group vitamins: Secondary | ICD-10-CM | POA: Diagnosis not present

## 2022-02-10 DIAGNOSIS — E538 Deficiency of other specified B group vitamins: Secondary | ICD-10-CM | POA: Diagnosis not present

## 2022-02-17 DIAGNOSIS — Z1389 Encounter for screening for other disorder: Secondary | ICD-10-CM | POA: Diagnosis not present

## 2022-02-17 DIAGNOSIS — M47816 Spondylosis without myelopathy or radiculopathy, lumbar region: Secondary | ICD-10-CM | POA: Diagnosis not present

## 2022-02-17 DIAGNOSIS — M549 Dorsalgia, unspecified: Secondary | ICD-10-CM | POA: Diagnosis not present

## 2022-02-17 DIAGNOSIS — E538 Deficiency of other specified B group vitamins: Secondary | ICD-10-CM | POA: Diagnosis not present

## 2022-02-17 DIAGNOSIS — G894 Chronic pain syndrome: Secondary | ICD-10-CM | POA: Diagnosis not present

## 2022-02-17 DIAGNOSIS — M5136 Other intervertebral disc degeneration, lumbar region: Secondary | ICD-10-CM | POA: Diagnosis not present

## 2022-02-27 DIAGNOSIS — Z8673 Personal history of transient ischemic attack (TIA), and cerebral infarction without residual deficits: Secondary | ICD-10-CM | POA: Diagnosis not present

## 2022-02-27 DIAGNOSIS — G894 Chronic pain syndrome: Secondary | ICD-10-CM | POA: Diagnosis not present

## 2022-02-27 DIAGNOSIS — M1991 Primary osteoarthritis, unspecified site: Secondary | ICD-10-CM | POA: Diagnosis not present

## 2022-02-27 DIAGNOSIS — F332 Major depressive disorder, recurrent severe without psychotic features: Secondary | ICD-10-CM | POA: Diagnosis not present

## 2022-02-27 DIAGNOSIS — E039 Hypothyroidism, unspecified: Secondary | ICD-10-CM | POA: Diagnosis not present

## 2022-02-27 DIAGNOSIS — I672 Cerebral atherosclerosis: Secondary | ICD-10-CM | POA: Diagnosis not present

## 2022-02-27 DIAGNOSIS — M7918 Myalgia, other site: Secondary | ICD-10-CM | POA: Diagnosis not present

## 2022-02-27 DIAGNOSIS — K5080 Crohn's disease of both small and large intestine without complications: Secondary | ICD-10-CM | POA: Diagnosis not present

## 2022-02-27 DIAGNOSIS — E538 Deficiency of other specified B group vitamins: Secondary | ICD-10-CM | POA: Diagnosis not present

## 2022-03-24 DIAGNOSIS — M5136 Other intervertebral disc degeneration, lumbar region: Secondary | ICD-10-CM | POA: Diagnosis not present

## 2022-03-24 DIAGNOSIS — G894 Chronic pain syndrome: Secondary | ICD-10-CM | POA: Diagnosis not present

## 2022-03-24 DIAGNOSIS — M47816 Spondylosis without myelopathy or radiculopathy, lumbar region: Secondary | ICD-10-CM | POA: Diagnosis not present

## 2022-03-24 DIAGNOSIS — M549 Dorsalgia, unspecified: Secondary | ICD-10-CM | POA: Diagnosis not present

## 2022-03-24 DIAGNOSIS — Z79891 Long term (current) use of opiate analgesic: Secondary | ICD-10-CM | POA: Diagnosis not present

## 2022-03-24 DIAGNOSIS — Z1389 Encounter for screening for other disorder: Secondary | ICD-10-CM | POA: Diagnosis not present

## 2022-03-25 DIAGNOSIS — R0609 Other forms of dyspnea: Secondary | ICD-10-CM | POA: Diagnosis not present

## 2022-03-25 DIAGNOSIS — Z955 Presence of coronary angioplasty implant and graft: Secondary | ICD-10-CM | POA: Diagnosis not present

## 2022-03-25 DIAGNOSIS — E785 Hyperlipidemia, unspecified: Secondary | ICD-10-CM | POA: Diagnosis not present

## 2022-03-25 DIAGNOSIS — I2510 Atherosclerotic heart disease of native coronary artery without angina pectoris: Secondary | ICD-10-CM | POA: Diagnosis not present

## 2022-03-25 DIAGNOSIS — Z87891 Personal history of nicotine dependence: Secondary | ICD-10-CM | POA: Diagnosis not present

## 2022-03-25 DIAGNOSIS — R001 Bradycardia, unspecified: Secondary | ICD-10-CM | POA: Diagnosis not present

## 2022-03-26 DIAGNOSIS — K5080 Crohn's disease of both small and large intestine without complications: Secondary | ICD-10-CM | POA: Diagnosis not present

## 2022-03-26 DIAGNOSIS — F411 Generalized anxiety disorder: Secondary | ICD-10-CM | POA: Diagnosis not present

## 2022-03-26 DIAGNOSIS — M7918 Myalgia, other site: Secondary | ICD-10-CM | POA: Diagnosis not present

## 2022-03-26 DIAGNOSIS — Z8673 Personal history of transient ischemic attack (TIA), and cerebral infarction without residual deficits: Secondary | ICD-10-CM | POA: Diagnosis not present

## 2022-03-26 DIAGNOSIS — I700 Atherosclerosis of aorta: Secondary | ICD-10-CM | POA: Diagnosis not present

## 2022-03-26 DIAGNOSIS — E663 Overweight: Secondary | ICD-10-CM | POA: Diagnosis not present

## 2022-03-26 DIAGNOSIS — I672 Cerebral atherosclerosis: Secondary | ICD-10-CM | POA: Diagnosis not present

## 2022-03-26 DIAGNOSIS — F332 Major depressive disorder, recurrent severe without psychotic features: Secondary | ICD-10-CM | POA: Diagnosis not present

## 2022-03-26 DIAGNOSIS — G894 Chronic pain syndrome: Secondary | ICD-10-CM | POA: Diagnosis not present

## 2022-03-30 DIAGNOSIS — I2510 Atherosclerotic heart disease of native coronary artery without angina pectoris: Secondary | ICD-10-CM | POA: Diagnosis not present

## 2022-03-30 DIAGNOSIS — R0609 Other forms of dyspnea: Secondary | ICD-10-CM | POA: Diagnosis not present

## 2022-04-07 DIAGNOSIS — Z955 Presence of coronary angioplasty implant and graft: Secondary | ICD-10-CM | POA: Diagnosis not present

## 2022-04-07 DIAGNOSIS — R0609 Other forms of dyspnea: Secondary | ICD-10-CM | POA: Diagnosis not present

## 2022-04-07 DIAGNOSIS — R9439 Abnormal result of other cardiovascular function study: Secondary | ICD-10-CM | POA: Diagnosis not present

## 2022-04-16 DIAGNOSIS — R6889 Other general symptoms and signs: Secondary | ICD-10-CM | POA: Diagnosis not present

## 2022-04-16 DIAGNOSIS — Z6823 Body mass index (BMI) 23.0-23.9, adult: Secondary | ICD-10-CM | POA: Diagnosis not present

## 2022-04-16 DIAGNOSIS — J101 Influenza due to other identified influenza virus with other respiratory manifestations: Secondary | ICD-10-CM | POA: Diagnosis not present

## 2022-04-16 DIAGNOSIS — R112 Nausea with vomiting, unspecified: Secondary | ICD-10-CM | POA: Diagnosis not present

## 2022-04-18 DIAGNOSIS — I252 Old myocardial infarction: Secondary | ICD-10-CM | POA: Diagnosis not present

## 2022-04-18 DIAGNOSIS — R001 Bradycardia, unspecified: Secondary | ICD-10-CM | POA: Diagnosis not present

## 2022-04-18 DIAGNOSIS — K219 Gastro-esophageal reflux disease without esophagitis: Secondary | ICD-10-CM | POA: Diagnosis not present

## 2022-04-18 DIAGNOSIS — S0990XA Unspecified injury of head, initial encounter: Secondary | ICD-10-CM | POA: Diagnosis not present

## 2022-04-18 DIAGNOSIS — I6782 Cerebral ischemia: Secondary | ICD-10-CM | POA: Diagnosis not present

## 2022-04-18 DIAGNOSIS — R079 Chest pain, unspecified: Secondary | ICD-10-CM | POA: Diagnosis not present

## 2022-04-18 DIAGNOSIS — R0902 Hypoxemia: Secondary | ICD-10-CM | POA: Diagnosis not present

## 2022-04-18 DIAGNOSIS — W19XXXA Unspecified fall, initial encounter: Secondary | ICD-10-CM | POA: Diagnosis not present

## 2022-04-18 DIAGNOSIS — I10 Essential (primary) hypertension: Secondary | ICD-10-CM | POA: Diagnosis not present

## 2022-04-18 DIAGNOSIS — G319 Degenerative disease of nervous system, unspecified: Secondary | ICD-10-CM | POA: Diagnosis not present

## 2022-04-18 DIAGNOSIS — R9431 Abnormal electrocardiogram [ECG] [EKG]: Secondary | ICD-10-CM | POA: Diagnosis not present

## 2022-04-18 DIAGNOSIS — I959 Hypotension, unspecified: Secondary | ICD-10-CM | POA: Diagnosis not present

## 2022-04-18 DIAGNOSIS — E119 Type 2 diabetes mellitus without complications: Secondary | ICD-10-CM | POA: Diagnosis not present

## 2022-04-18 DIAGNOSIS — N289 Disorder of kidney and ureter, unspecified: Secondary | ICD-10-CM | POA: Diagnosis not present

## 2022-04-24 DIAGNOSIS — M79651 Pain in right thigh: Secondary | ICD-10-CM | POA: Diagnosis not present

## 2022-04-27 DIAGNOSIS — Z79891 Long term (current) use of opiate analgesic: Secondary | ICD-10-CM | POA: Diagnosis not present

## 2022-04-27 DIAGNOSIS — Z1389 Encounter for screening for other disorder: Secondary | ICD-10-CM | POA: Diagnosis not present

## 2022-04-27 DIAGNOSIS — M5136 Other intervertebral disc degeneration, lumbar region: Secondary | ICD-10-CM | POA: Diagnosis not present

## 2022-04-27 DIAGNOSIS — G894 Chronic pain syndrome: Secondary | ICD-10-CM | POA: Diagnosis not present

## 2022-04-27 DIAGNOSIS — M47816 Spondylosis without myelopathy or radiculopathy, lumbar region: Secondary | ICD-10-CM | POA: Diagnosis not present

## 2022-04-27 DIAGNOSIS — M549 Dorsalgia, unspecified: Secondary | ICD-10-CM | POA: Diagnosis not present

## 2022-04-30 DIAGNOSIS — I25119 Atherosclerotic heart disease of native coronary artery with unspecified angina pectoris: Secondary | ICD-10-CM | POA: Diagnosis not present

## 2022-04-30 DIAGNOSIS — N289 Disorder of kidney and ureter, unspecified: Secondary | ICD-10-CM | POA: Diagnosis not present

## 2022-04-30 DIAGNOSIS — S0990XD Unspecified injury of head, subsequent encounter: Secondary | ICD-10-CM | POA: Diagnosis not present

## 2022-04-30 DIAGNOSIS — M25551 Pain in right hip: Secondary | ICD-10-CM | POA: Diagnosis not present

## 2022-04-30 DIAGNOSIS — I9589 Other hypotension: Secondary | ICD-10-CM | POA: Diagnosis not present

## 2022-04-30 DIAGNOSIS — I672 Cerebral atherosclerosis: Secondary | ICD-10-CM | POA: Diagnosis not present

## 2022-04-30 DIAGNOSIS — M79604 Pain in right leg: Secondary | ICD-10-CM | POA: Diagnosis not present

## 2022-04-30 DIAGNOSIS — M1991 Primary osteoarthritis, unspecified site: Secondary | ICD-10-CM | POA: Diagnosis not present

## 2022-04-30 DIAGNOSIS — G894 Chronic pain syndrome: Secondary | ICD-10-CM | POA: Diagnosis not present

## 2022-05-06 DIAGNOSIS — I7090 Unspecified atherosclerosis: Secondary | ICD-10-CM | POA: Diagnosis not present

## 2022-05-06 DIAGNOSIS — Z87891 Personal history of nicotine dependence: Secondary | ICD-10-CM | POA: Diagnosis not present

## 2022-05-06 DIAGNOSIS — Z955 Presence of coronary angioplasty implant and graft: Secondary | ICD-10-CM | POA: Diagnosis not present

## 2022-05-06 DIAGNOSIS — I2510 Atherosclerotic heart disease of native coronary artery without angina pectoris: Secondary | ICD-10-CM | POA: Diagnosis not present

## 2022-05-06 DIAGNOSIS — I10 Essential (primary) hypertension: Secondary | ICD-10-CM | POA: Diagnosis not present

## 2022-05-06 DIAGNOSIS — R0609 Other forms of dyspnea: Secondary | ICD-10-CM | POA: Diagnosis not present

## 2022-05-06 DIAGNOSIS — E785 Hyperlipidemia, unspecified: Secondary | ICD-10-CM | POA: Diagnosis not present

## 2022-05-06 DIAGNOSIS — Z7982 Long term (current) use of aspirin: Secondary | ICD-10-CM | POA: Diagnosis not present

## 2022-05-06 DIAGNOSIS — R9439 Abnormal result of other cardiovascular function study: Secondary | ICD-10-CM | POA: Diagnosis not present

## 2022-05-20 DIAGNOSIS — Z1389 Encounter for screening for other disorder: Secondary | ICD-10-CM | POA: Diagnosis not present

## 2022-05-20 DIAGNOSIS — R269 Unspecified abnormalities of gait and mobility: Secondary | ICD-10-CM | POA: Diagnosis not present

## 2022-05-20 DIAGNOSIS — M5136 Other intervertebral disc degeneration, lumbar region: Secondary | ICD-10-CM | POA: Diagnosis not present

## 2022-05-20 DIAGNOSIS — M47816 Spondylosis without myelopathy or radiculopathy, lumbar region: Secondary | ICD-10-CM | POA: Diagnosis not present

## 2022-05-20 DIAGNOSIS — M549 Dorsalgia, unspecified: Secondary | ICD-10-CM | POA: Diagnosis not present

## 2022-05-20 DIAGNOSIS — Z79891 Long term (current) use of opiate analgesic: Secondary | ICD-10-CM | POA: Diagnosis not present

## 2022-05-20 DIAGNOSIS — G894 Chronic pain syndrome: Secondary | ICD-10-CM | POA: Diagnosis not present

## 2022-06-03 DIAGNOSIS — F332 Major depressive disorder, recurrent severe without psychotic features: Secondary | ICD-10-CM | POA: Diagnosis not present

## 2022-06-15 DIAGNOSIS — M47816 Spondylosis without myelopathy or radiculopathy, lumbar region: Secondary | ICD-10-CM | POA: Diagnosis not present

## 2022-06-15 DIAGNOSIS — M5136 Other intervertebral disc degeneration, lumbar region: Secondary | ICD-10-CM | POA: Diagnosis not present

## 2022-06-15 DIAGNOSIS — Z1389 Encounter for screening for other disorder: Secondary | ICD-10-CM | POA: Diagnosis not present

## 2022-06-15 DIAGNOSIS — R269 Unspecified abnormalities of gait and mobility: Secondary | ICD-10-CM | POA: Diagnosis not present

## 2022-06-15 DIAGNOSIS — Z79891 Long term (current) use of opiate analgesic: Secondary | ICD-10-CM | POA: Diagnosis not present

## 2022-06-15 DIAGNOSIS — G894 Chronic pain syndrome: Secondary | ICD-10-CM | POA: Diagnosis not present

## 2022-06-15 DIAGNOSIS — M549 Dorsalgia, unspecified: Secondary | ICD-10-CM | POA: Diagnosis not present

## 2022-06-17 DIAGNOSIS — I2510 Atherosclerotic heart disease of native coronary artery without angina pectoris: Secondary | ICD-10-CM | POA: Diagnosis not present

## 2022-06-17 DIAGNOSIS — Z87891 Personal history of nicotine dependence: Secondary | ICD-10-CM | POA: Diagnosis not present

## 2022-06-17 DIAGNOSIS — E785 Hyperlipidemia, unspecified: Secondary | ICD-10-CM | POA: Diagnosis not present

## 2022-06-17 DIAGNOSIS — Z955 Presence of coronary angioplasty implant and graft: Secondary | ICD-10-CM | POA: Diagnosis not present

## 2022-06-18 DIAGNOSIS — I10 Essential (primary) hypertension: Secondary | ICD-10-CM | POA: Diagnosis not present

## 2022-06-18 DIAGNOSIS — E663 Overweight: Secondary | ICD-10-CM | POA: Diagnosis not present

## 2022-06-18 DIAGNOSIS — J410 Simple chronic bronchitis: Secondary | ICD-10-CM | POA: Diagnosis not present

## 2022-06-18 DIAGNOSIS — I25119 Atherosclerotic heart disease of native coronary artery with unspecified angina pectoris: Secondary | ICD-10-CM | POA: Diagnosis not present

## 2022-06-18 DIAGNOSIS — I9589 Other hypotension: Secondary | ICD-10-CM | POA: Diagnosis not present

## 2022-06-18 DIAGNOSIS — Z6825 Body mass index (BMI) 25.0-25.9, adult: Secondary | ICD-10-CM | POA: Diagnosis not present

## 2022-06-21 ENCOUNTER — Other Ambulatory Visit: Payer: Self-pay

## 2022-06-21 ENCOUNTER — Encounter (HOSPITAL_COMMUNITY): Payer: Self-pay

## 2022-06-21 ENCOUNTER — Emergency Department (HOSPITAL_COMMUNITY): Payer: Medicare HMO

## 2022-06-21 ENCOUNTER — Inpatient Hospital Stay (HOSPITAL_COMMUNITY)
Admission: EM | Admit: 2022-06-21 | Discharge: 2022-06-29 | DRG: 872 | Disposition: A | Discharge status: Skilled Nursing Facility | Payer: Medicare HMO | Attending: Internal Medicine | Admitting: Internal Medicine

## 2022-06-21 DIAGNOSIS — E860 Dehydration: Secondary | ICD-10-CM | POA: Diagnosis present

## 2022-06-21 DIAGNOSIS — Z955 Presence of coronary angioplasty implant and graft: Secondary | ICD-10-CM | POA: Diagnosis not present

## 2022-06-21 DIAGNOSIS — Z87891 Personal history of nicotine dependence: Secondary | ICD-10-CM

## 2022-06-21 DIAGNOSIS — D500 Iron deficiency anemia secondary to blood loss (chronic): Secondary | ICD-10-CM | POA: Diagnosis not present

## 2022-06-21 DIAGNOSIS — E538 Deficiency of other specified B group vitamins: Secondary | ICD-10-CM | POA: Diagnosis present

## 2022-06-21 DIAGNOSIS — I2510 Atherosclerotic heart disease of native coronary artery without angina pectoris: Secondary | ICD-10-CM | POA: Diagnosis present

## 2022-06-21 DIAGNOSIS — B961 Klebsiella pneumoniae [K. pneumoniae] as the cause of diseases classified elsewhere: Secondary | ICD-10-CM | POA: Diagnosis present

## 2022-06-21 DIAGNOSIS — G309 Alzheimer's disease, unspecified: Secondary | ICD-10-CM | POA: Diagnosis present

## 2022-06-21 DIAGNOSIS — W19XXXA Unspecified fall, initial encounter: Secondary | ICD-10-CM | POA: Diagnosis not present

## 2022-06-21 DIAGNOSIS — G8929 Other chronic pain: Secondary | ICD-10-CM | POA: Diagnosis present

## 2022-06-21 DIAGNOSIS — G1221 Amyotrophic lateral sclerosis: Secondary | ICD-10-CM | POA: Diagnosis not present

## 2022-06-21 DIAGNOSIS — Z7401 Bed confinement status: Secondary | ICD-10-CM | POA: Diagnosis not present

## 2022-06-21 DIAGNOSIS — N179 Acute kidney failure, unspecified: Secondary | ICD-10-CM | POA: Diagnosis not present

## 2022-06-21 DIAGNOSIS — F39 Unspecified mood [affective] disorder: Secondary | ICD-10-CM

## 2022-06-21 DIAGNOSIS — K219 Gastro-esophageal reflux disease without esophagitis: Secondary | ICD-10-CM | POA: Diagnosis present

## 2022-06-21 DIAGNOSIS — K50919 Crohn's disease, unspecified, with unspecified complications: Secondary | ICD-10-CM | POA: Diagnosis not present

## 2022-06-21 DIAGNOSIS — R2689 Other abnormalities of gait and mobility: Secondary | ICD-10-CM | POA: Diagnosis not present

## 2022-06-21 DIAGNOSIS — E039 Hypothyroidism, unspecified: Secondary | ICD-10-CM | POA: Diagnosis present

## 2022-06-21 DIAGNOSIS — Z885 Allergy status to narcotic agent status: Secondary | ICD-10-CM

## 2022-06-21 DIAGNOSIS — Z87820 Personal history of traumatic brain injury: Secondary | ICD-10-CM

## 2022-06-21 DIAGNOSIS — Z1152 Encounter for screening for COVID-19: Secondary | ICD-10-CM

## 2022-06-21 DIAGNOSIS — S0990XA Unspecified injury of head, initial encounter: Secondary | ICD-10-CM | POA: Diagnosis not present

## 2022-06-21 DIAGNOSIS — A419 Sepsis, unspecified organism: Principal | ICD-10-CM | POA: Diagnosis present

## 2022-06-21 DIAGNOSIS — F0283 Dementia in other diseases classified elsewhere, unspecified severity, with mood disturbance: Secondary | ICD-10-CM | POA: Diagnosis present

## 2022-06-21 DIAGNOSIS — Z7982 Long term (current) use of aspirin: Secondary | ICD-10-CM

## 2022-06-21 DIAGNOSIS — R2681 Unsteadiness on feet: Secondary | ICD-10-CM | POA: Diagnosis not present

## 2022-06-21 DIAGNOSIS — K5900 Constipation, unspecified: Secondary | ICD-10-CM | POA: Diagnosis present

## 2022-06-21 DIAGNOSIS — I252 Old myocardial infarction: Secondary | ICD-10-CM | POA: Diagnosis not present

## 2022-06-21 DIAGNOSIS — E782 Mixed hyperlipidemia: Secondary | ICD-10-CM | POA: Diagnosis present

## 2022-06-21 DIAGNOSIS — R6520 Severe sepsis without septic shock: Secondary | ICD-10-CM | POA: Diagnosis present

## 2022-06-21 DIAGNOSIS — R41841 Cognitive communication deficit: Secondary | ICD-10-CM | POA: Diagnosis not present

## 2022-06-21 DIAGNOSIS — K5090 Crohn's disease, unspecified, without complications: Secondary | ICD-10-CM | POA: Diagnosis not present

## 2022-06-21 DIAGNOSIS — Z79890 Hormone replacement therapy (postmenopausal): Secondary | ICD-10-CM

## 2022-06-21 DIAGNOSIS — Z7902 Long term (current) use of antithrombotics/antiplatelets: Secondary | ICD-10-CM

## 2022-06-21 DIAGNOSIS — I959 Hypotension, unspecified: Secondary | ICD-10-CM | POA: Diagnosis not present

## 2022-06-21 DIAGNOSIS — I10 Essential (primary) hypertension: Secondary | ICD-10-CM | POA: Diagnosis present

## 2022-06-21 DIAGNOSIS — R531 Weakness: Secondary | ICD-10-CM

## 2022-06-21 DIAGNOSIS — N390 Urinary tract infection, site not specified: Secondary | ICD-10-CM | POA: Diagnosis not present

## 2022-06-21 DIAGNOSIS — Z79891 Long term (current) use of opiate analgesic: Secondary | ICD-10-CM

## 2022-06-21 DIAGNOSIS — Z79899 Other long term (current) drug therapy: Secondary | ICD-10-CM

## 2022-06-21 DIAGNOSIS — Z9181 History of falling: Secondary | ICD-10-CM | POA: Diagnosis not present

## 2022-06-21 DIAGNOSIS — R1111 Vomiting without nausea: Secondary | ICD-10-CM | POA: Diagnosis not present

## 2022-06-21 DIAGNOSIS — A0811 Acute gastroenteropathy due to Norwalk agent: Secondary | ICD-10-CM | POA: Diagnosis not present

## 2022-06-21 DIAGNOSIS — M6281 Muscle weakness (generalized): Secondary | ICD-10-CM | POA: Diagnosis not present

## 2022-06-21 DIAGNOSIS — K3189 Other diseases of stomach and duodenum: Secondary | ICD-10-CM | POA: Diagnosis not present

## 2022-06-21 LAB — URINALYSIS, W/ REFLEX TO CULTURE (INFECTION SUSPECTED)
Bilirubin Urine: NEGATIVE
Bilirubin Urine: NEGATIVE
Glucose, UA: NEGATIVE mg/dL
Glucose, UA: NEGATIVE mg/dL
Hgb urine dipstick: NEGATIVE
Ketones, ur: NEGATIVE mg/dL
Ketones, ur: NEGATIVE mg/dL
Nitrite: NEGATIVE
Nitrite: POSITIVE — AB
Protein, ur: 30 mg/dL — AB
Protein, ur: 30 mg/dL — AB
Specific Gravity, Urine: 1.012 (ref 1.005–1.030)
Specific Gravity, Urine: 1.013 (ref 1.005–1.030)
pH: 5 (ref 5.0–8.0)
pH: 5 (ref 5.0–8.0)

## 2022-06-21 LAB — CBC WITH DIFFERENTIAL/PLATELET
Abs Immature Granulocytes: 0.09 10*3/uL — ABNORMAL HIGH (ref 0.00–0.07)
Basophils Absolute: 0.1 10*3/uL (ref 0.0–0.1)
Basophils Relative: 0 %
Eosinophils Absolute: 0 10*3/uL (ref 0.0–0.5)
Eosinophils Relative: 0 %
HCT: 32.9 % — ABNORMAL LOW (ref 39.0–52.0)
Hemoglobin: 11 g/dL — ABNORMAL LOW (ref 13.0–17.0)
Immature Granulocytes: 1 %
Lymphocytes Relative: 5 %
Lymphs Abs: 0.8 10*3/uL (ref 0.7–4.0)
MCH: 33.2 pg (ref 26.0–34.0)
MCHC: 33.4 g/dL (ref 30.0–36.0)
MCV: 99.4 fL (ref 80.0–100.0)
Monocytes Absolute: 1.3 10*3/uL — ABNORMAL HIGH (ref 0.1–1.0)
Monocytes Relative: 8 %
Neutro Abs: 13.6 10*3/uL — ABNORMAL HIGH (ref 1.7–7.7)
Neutrophils Relative %: 86 %
Platelets: 201 10*3/uL (ref 150–400)
RBC: 3.31 MIL/uL — ABNORMAL LOW (ref 4.22–5.81)
RDW: 13 % (ref 11.5–15.5)
WBC: 15.8 10*3/uL — ABNORMAL HIGH (ref 4.0–10.5)
nRBC: 0 % (ref 0.0–0.2)

## 2022-06-21 LAB — CBC
HCT: 31.1 % — ABNORMAL LOW (ref 39.0–52.0)
Hemoglobin: 10.6 g/dL — ABNORMAL LOW (ref 13.0–17.0)
MCH: 33.3 pg (ref 26.0–34.0)
MCHC: 34.1 g/dL (ref 30.0–36.0)
MCV: 97.8 fL (ref 80.0–100.0)
Platelets: 198 10*3/uL (ref 150–400)
RBC: 3.18 MIL/uL — ABNORMAL LOW (ref 4.22–5.81)
RDW: 12.9 % (ref 11.5–15.5)
WBC: 14.4 10*3/uL — ABNORMAL HIGH (ref 4.0–10.5)
nRBC: 0 % (ref 0.0–0.2)

## 2022-06-21 LAB — COMPREHENSIVE METABOLIC PANEL
ALT: 14 U/L (ref 0–44)
AST: 17 U/L (ref 15–41)
Albumin: 2.8 g/dL — ABNORMAL LOW (ref 3.5–5.0)
Alkaline Phosphatase: 61 U/L (ref 38–126)
Anion gap: 14 (ref 5–15)
BUN: 18 mg/dL (ref 8–23)
CO2: 25 mmol/L (ref 22–32)
Calcium: 8.4 mg/dL — ABNORMAL LOW (ref 8.9–10.3)
Chloride: 97 mmol/L — ABNORMAL LOW (ref 98–111)
Creatinine, Ser: 1.99 mg/dL — ABNORMAL HIGH (ref 0.61–1.24)
GFR, Estimated: 35 mL/min — ABNORMAL LOW (ref >60)
Glucose, Bld: 117 mg/dL — ABNORMAL HIGH (ref 70–99)
Potassium: 3.6 mmol/L (ref 3.5–5.1)
Sodium: 136 mmol/L (ref 135–145)
Total Bilirubin: 1 mg/dL (ref 0.3–1.2)
Total Protein: 6.4 g/dL — ABNORMAL LOW (ref 6.5–8.1)

## 2022-06-21 LAB — FERRITIN: Ferritin: 717 ng/mL — ABNORMAL HIGH (ref 24–336)

## 2022-06-21 LAB — RETICULOCYTES
Immature Retic Fract: 4.2 % (ref 2.3–15.9)
RBC.: 3.15 MIL/uL — ABNORMAL LOW (ref 4.22–5.81)
Retic Count, Absolute: 28.7 10*3/uL (ref 19.0–186.0)
Retic Ct Pct: 0.9 % (ref 0.4–3.1)

## 2022-06-21 LAB — IRON AND TIBC
Iron: 14 ug/dL — ABNORMAL LOW (ref 45–182)
Saturation Ratios: 8 % — ABNORMAL LOW (ref 17.9–39.5)
TIBC: 188 ug/dL — ABNORMAL LOW (ref 250–450)
UIBC: 174 ug/dL

## 2022-06-21 LAB — PROCALCITONIN: Procalcitonin: 0.48 ng/mL

## 2022-06-21 LAB — TROPONIN I (HIGH SENSITIVITY)
Troponin I (High Sensitivity): 36 ng/L — ABNORMAL HIGH (ref <18)
Troponin I (High Sensitivity): 25 ng/L — ABNORMAL HIGH (ref <18)
Troponin I (High Sensitivity): 23 ng/L — ABNORMAL HIGH (ref <18)
Troponin I (High Sensitivity): 28 ng/L — ABNORMAL HIGH (ref <18)

## 2022-06-21 LAB — MAGNESIUM: Magnesium: 1.7 mg/dL (ref 1.7–2.4)

## 2022-06-21 LAB — TSH: TSH: 0.261 u[IU]/mL — ABNORMAL LOW (ref 0.350–4.500)

## 2022-06-21 LAB — CREATININE, SERUM
Creatinine, Ser: 1.56 mg/dL — ABNORMAL HIGH (ref 0.61–1.24)
GFR, Estimated: 47 mL/min — ABNORMAL LOW (ref >60)

## 2022-06-21 LAB — LACTIC ACID, PLASMA
Lactic Acid, Venous: 1.9 mmol/L (ref 0.5–1.9)
Lactic Acid, Venous: 1.5 mmol/L (ref 0.5–1.9)

## 2022-06-21 LAB — RESP PANEL BY RT-PCR (RSV, FLU A&B, COVID)  RVPGX2
Influenza A by PCR: NEGATIVE
Influenza B by PCR: NEGATIVE
Resp Syncytial Virus by PCR: NEGATIVE
SARS Coronavirus 2 by RT PCR: NEGATIVE

## 2022-06-21 LAB — VITAMIN B12: Vitamin B-12: 237 pg/mL (ref 180–914)

## 2022-06-21 LAB — HIV ANTIBODY (ROUTINE TESTING W REFLEX): HIV Screen 4th Generation wRfx: NONREACTIVE

## 2022-06-21 LAB — FOLATE: Folate: 3.5 ng/mL — ABNORMAL LOW (ref >5.9)

## 2022-06-21 LAB — C-REACTIVE PROTEIN: CRP: 31.3 mg/dL — ABNORMAL HIGH (ref <1.0)

## 2022-06-21 LAB — PROTIME-INR
INR: 1.3 — ABNORMAL HIGH (ref 0.8–1.2)
Prothrombin Time: 16.1 seconds — ABNORMAL HIGH (ref 11.4–15.2)

## 2022-06-21 LAB — MRSA NEXT GEN BY PCR, NASAL: MRSA by PCR Next Gen: NOT DETECTED

## 2022-06-21 MED ORDER — SODIUM CHLORIDE 0.9 % IV SOLN
2.0000 g | Freq: Once | INTRAVENOUS | Status: AC
  Administered 2022-06-21: 2 g via INTRAVENOUS
  Filled 2022-06-21: qty 20

## 2022-06-21 MED ORDER — ONDANSETRON HCL 4 MG/2ML IJ SOLN
4.0000 mg | Freq: Four times a day (QID) | INTRAMUSCULAR | Status: DC | PRN
  Administered 2022-06-22: 4 mg via INTRAVENOUS
  Filled 2022-06-21: qty 2

## 2022-06-21 MED ORDER — LACTATED RINGERS IV SOLN: INTRAVENOUS | Status: AC

## 2022-06-21 MED ORDER — LACTATED RINGERS IV BOLUS
1000.0000 mL | Freq: Once | INTRAVENOUS | Status: AC
  Administered 2022-06-21: 1000 mL via INTRAVENOUS

## 2022-06-21 MED ORDER — HEPARIN SODIUM (PORCINE) 5000 UNIT/ML IJ SOLN
5000.0000 [IU] | Freq: Three times a day (TID) | INTRAMUSCULAR | Status: DC
  Administered 2022-06-21 – 2022-06-29 (×22): 5000 [IU] via SUBCUTANEOUS
  Filled 2022-06-21 (×23): qty 1

## 2022-06-21 MED ORDER — ACETAMINOPHEN 325 MG PO TABS
650.0000 mg | ORAL_TABLET | Freq: Four times a day (QID) | ORAL | Status: DC | PRN
  Administered 2022-06-22: 650 mg via ORAL
  Filled 2022-06-21: qty 2

## 2022-06-21 MED ORDER — LACTATED RINGERS IV SOLN: INTRAVENOUS | Status: DC

## 2022-06-21 MED ORDER — ONDANSETRON HCL 4 MG PO TABS: 4.0000 mg | ORAL_TABLET | Freq: Four times a day (QID) | ORAL | Status: DC | PRN

## 2022-06-21 MED ORDER — LACTATED RINGERS IV BOLUS (SEPSIS)
1000.0000 mL | Freq: Once | INTRAVENOUS | Status: AC
  Administered 2022-06-21: 1000 mL via INTRAVENOUS

## 2022-06-21 MED ORDER — SODIUM CHLORIDE 0.9 % IV SOLN
2.0000 g | INTRAVENOUS | Status: DC
  Administered 2022-06-22 – 2022-06-27 (×6): 2 g via INTRAVENOUS
  Filled 2022-06-21 (×6): qty 20

## 2022-06-21 MED ORDER — ACETAMINOPHEN 650 MG RE SUPP: 650.0000 mg | Freq: Four times a day (QID) | RECTAL | Status: DC | PRN

## 2022-06-21 MED ORDER — ALBUTEROL SULFATE (2.5 MG/3ML) 0.083% IN NEBU: 2.5000 mg | INHALATION_SOLUTION | RESPIRATORY_TRACT | Status: DC | PRN

## 2022-06-21 NOTE — H&P (Signed)
History and Physical    Derek Vargas [id number] DOB: Apr 06, 1950 DOA: 06/21/2022  PCP: Venetia Maxon, Sharon Mt, MD  Patient coming from: home  I have personally briefly reviewed patient's old medical records in [place]  Chief Complaint: weakness, falls ,n/v  HPI: Derek Vargas is a 72 y.o. male with medical history significant of  CAD s/p stent LAD 2010,, recent cath noting nonobstructive disease 1/24, GERD, Chron's disease, HLD, hypertension, ALS, hypothyroidism who presents to ED BIB EMS after 3 days of feeling weak with increasing falls  and n/v/fever at home.  Patient today noted progressive symptoms with  fall for which BLS was called for lift assistance. Per notes on evaluation patient was found to be hypotensive and ACLS was called. Patient was given 700cc ns for sbp of 80 and transported to ED. Patient currently noted no current n/v/d/abdominal pain, sob/chest pain , but does note continued weakness.   ED Course:  Afeb, bp 98/58-109/52, hr 92, rr 15, sat 100%  Wbc: 15.8, hgb 11 ( 9.8) mcv 99.4 , plt 201  inr 1.3  N a 136, K 3.6, cr 1.99 ( 1.02 , 2022) CE 36,25 Mag 1.7 EKG nsr  Resp pane neg UA + bacterai wbc 21-50 Lactic 1.5 Ctabd/pelvis IMPRESSION: 1. No acute chest finding. Scattered areas of pulmonary scarring. No sign of acute infectious pneumonia. 2. No acute abdominal or pelvic finding. 3. Aortic atherosclerosis. Coronary artery calcification. 4. Fairly large amount of fecal matter in the colon, but no evidence of acute colon pathology.   Tx 1L    Review of Systems: As per HPI otherwise 10 point review of systems negative.   Past Medical History:  Diagnosis Date   Alzheimer's disease ([place])    BORDERLINE per wife   Arthritis    Coronary artery disease involving native coronary artery of native heart without angina pectoris 01/10/2021   Crohn disease ([place])    GERD without esophagitis 01/10/2021   Hyperlipidemia    Hypertension     Hypothyroidism 01/10/2021   [name] disease [facility])    Mixed hyperlipidemia 01/10/2021   STEMI (ST elevation myocardial infarction) ([place]) 2010    Past Surgical History:  Procedure Laterality Date   LEG SURGERY Right    1990s?     reports that he has quit smoking. His smoking use included cigarettes. He has never used smokeless tobacco. He reports that he does not drink alcohol and does not use drugs.  Allergies  Allergen Reactions   Codeine Nausea And Vomiting    Family History  Problem Relation Age of Onset   Heart attack Neg Hx     Prior to Admission medications   Medication Sig Start Date End Date Taking? Authorizing Provider  aspirin EC 81 MG tablet Take 81 mg by mouth daily.    [provider]  atorvastatin (LIPITOR) 80 MG tablet Take 80 mg by mouth daily.    [provider]  baclofen (LIORESAL) 10 MG tablet Take 5-10 mg by mouth 3 (three) times daily as needed for pain. 12/26/20   [provider]  buPROPion (WELLBUTRIN XL) 150 MG 24 hr tablet Take 150 mg by mouth daily.    [provider]  clonazePAM (KLONOPIN) 1 MG tablet Take 1 mg by mouth daily. 01/29/14   [provider]  clopidogrel (PLAVIX) 75 MG tablet Take 1 tablet (75 mg total) by mouth daily. 11/20/18   Rinehuls, David L, PA-C  doxepin (SINEQUAN) 100 MG capsule Take 100 mg  by mouth at bedtime. 10/05/18   [provider]  FLUoxetine (PROZAC) 40 MG capsule Take 40 mg by mouth daily.    [provider]  levothyroxine (SYNTHROID) 25 MCG tablet Take 25 mcg by mouth daily before breakfast.    [provider]  lisinopril (ZESTRIL) 5 MG tablet Take 5 mg by mouth daily.    [provider]  Melatonin 10 MG TABS Take 10 mg by mouth at bedtime.    [provider]  metoprolol tartrate (LOPRESSOR) 50 MG tablet Take 50 mg by mouth 2 (two) times daily.    [provider]  primidone (MYSOLINE) 250 MG tablet Take 1 tablet (250 mg total)  by mouth 4 (four) times daily. 11/19/18   Rinehuls, Early Chars, PA-C  QUEtiapine (SEROQUEL) 300 MG tablet Take 750 mg by mouth at bedtime.    [provider]  traMADol-acetaminophen (ULTRACET) 37.5-325 MG tablet Take 1 tablet by mouth 2 (two) times daily.     [provider]    Physical Exam: Vitals:   06/21/22 1630 06/21/22 1639 06/21/22 1645 06/21/22 1730  BP: (!) 109/52  95/63   Pulse: 92  96 97  Resp: 18  20 (!) 21  Temp:  98.8 F (37.1 C)    TempSrc:  Rectal    SpO2: 98%  99% 95%  Weight:      Height:        Constitutional: NAD, calm, comfortable Vitals:   06/21/22 1630 06/21/22 1639 06/21/22 1645 06/21/22 1730  BP: (!) 109/52  95/63   Pulse: 92  96 97  Resp: 18  20 (!) 21  Temp:  98.8 F (37.1 C)    TempSrc:  Rectal    SpO2: 98%  99% 95%  Weight:      Height:       Eyes: PERRL, lids and conjunctivae normal ENMT: Mucous membranes are moist. Posterior pharynx clear of any exudate or lesions.Normal dentition.  Neck: normal, supple, no masses, no thyromegaly Respiratory: clear to auscultation bilaterally, no wheezing, no crackles. Normal respiratory effort. No accessory muscle use.  Cardiovascular: Regular rate and rhythm, no murmurs / rubs / gallops. No extremity edema. 2+ pedal pulses. No carotid bruits.  Abdomen: no tenderness, no masses palpated. No hepatosplenomegaly. Bowel sounds positive.  Musculoskeletal: no clubbing / cyanosis. No joint deformity upper and lower extremities. Good ROM, no contractures. Normal muscle tone.  Skin: no rashes, lesions, ulcers. No induration Neurologic: CN 2-12 grossly intact. Sensation intact, l. Strength 4+/5 in all 4.  Psychiatric: Normal judgment and insight. Alert and oriented x 3. Normal mood.    Labs on Admission: I have personally reviewed following labs and imaging studies  CBC: Recent Labs  Lab 06/21/22 1404  WBC 15.8*  NEUTROABS 13.6*  HGB 11.0*  HCT 32.9*  MCV 99.4  PLT 123456   Basic Metabolic  Panel: Recent Labs  Lab 06/21/22 1404  NA 136  K 3.6  CL 97*  CO2 25  GLUCOSE 117*  BUN 18  CREATININE 1.99*  CALCIUM 8.4*  MG 1.7   GFR: Estimated Creatinine Clearance: 35 mL/min (A) (by C-G formula based on SCr of 1.99 mg/dL (H)). Liver Function Tests: Recent Labs  Lab 06/21/22 1404  AST 17  ALT 14  ALKPHOS 61  BILITOT 1.0  PROT 6.4*  ALBUMIN 2.8*   No results for input(s): "LIPASE", "AMYLASE" in the last 168 hours. No results for input(s): "AMMONIA" in the last 168 hours. Coagulation Profile: Recent Labs  Lab 06/21/22 1404  INR 1.3*   Cardiac Enzymes: No results for input(s): "CKTOTAL", "CKMB", "CKMBINDEX", "TROPONINI" in the last 168 hours. BNP (last 3 results) No results for input(s): "PROBNP" in the last 8760 hours. HbA1C: No results for input(s): "HGBA1C" in the last 72 hours. CBG: No results for input(s): "GLUCAP" in the last 168 hours. Lipid Profile: No results for input(s): "CHOL", "HDL", "LDLCALC", "TRIG", "CHOLHDL", "LDLDIRECT" in the last 72 hours. Thyroid Function Tests: No results for input(s): "TSH", "T4TOTAL", "FREET4", "T3FREE", "THYROIDAB" in the last 72 hours. Anemia Panel: No results for input(s): "VITAMINB12", "FOLATE", "FERRITIN", "TIBC", "IRON", "RETICCTPCT" in the last 72 hours. Urine analysis:    Component Value Date/Time   COLORURINE YELLOW 06/21/2022 1559   APPEARANCEUR HAZY (A) 06/21/2022 1559   LABSPEC 1.012 06/21/2022 1559   PHURINE 5.0 06/21/2022 1559   GLUCOSEU NEGATIVE 06/21/2022 1559   HGBUR NEGATIVE 06/21/2022 1559   BILIRUBINUR NEGATIVE 06/21/2022 1559   KETONESUR NEGATIVE 06/21/2022 1559   PROTEINUR 30 (A) 06/21/2022 1559   NITRITE NEGATIVE 06/21/2022 1559   LEUKOCYTESUR SMALL (A) 06/21/2022 1559    Radiological Exams on Admission: CT CHEST ABDOMEN PELVIS WO CONTRAST  Result Date: 06/21/2022 CLINICAL DATA:  Sepsis EXAM: CT CHEST, ABDOMEN AND PELVIS WITHOUT CONTRAST TECHNIQUE: Multidetector CT imaging of the  chest, abdomen and pelvis was performed following the standard protocol without IV contrast. RADIATION DOSE REDUCTION: This exam was performed according to the departmental dose-optimization program which includes automated exposure control, adjustment of the mA and/or kV according to patient size and/or use of iterative reconstruction technique. COMPARISON:  Chest radiography same day. CT 01/10/2021. CT 01/20/2017. FINDINGS: CT CHEST FINDINGS Cardiovascular: Heart size is normal. Coronary artery calcification and aortic atherosclerotic calcification are present. No pericardial effusion. Mediastinum/Nodes: No mass or lymphadenopathy. Lungs/Pleura: No pleural effusion. Scattered areas of pulmonary scarring. No sign of acute infectious pneumonia. No lobar collapse. No effusion. I do not believe that there is any pulmonary nodule requiring follow-up. Musculoskeletal: Unremarkable appearance of the spine, sternum and ribs. CT ABDOMEN PELVIS FINDINGS Hepatobiliary: Liver parenchyma is normal. No calcified gallstones. No CT evidence of gallbladder inflammation. Pancreas: Normal Spleen: Normal Adrenals/Urinary Tract: Adrenal glands are normal. Kidneys are normal. No cyst, mass, stone or hydronephrosis. No evidence of renal infection. The bladder is normal. Stomach/Bowel: Stomach and small intestine are normal. Normal appendix. Fairly large amount of fecal matter in the colon, but no evidence of acute colon pathology. No diverticulosis or diverticulitis. Vascular/Lymphatic: Aortic atherosclerosis. No aneurysm. IVC is normal. No adenopathy. Reproductive: Normal Other: No free fluid or air. Musculoskeletal: Ordinary mild lower lumbar degenerative changes. Previous ORIF of a right femur fracture. IMPRESSION: 1. No acute chest finding. Scattered areas of pulmonary scarring. No sign of acute infectious pneumonia. 2. No acute abdominal or pelvic finding. 3. Aortic atherosclerosis. Coronary artery calcification. 4. Fairly large  amount of fecal matter in the colon, but no evidence of acute colon pathology. Aortic Atherosclerosis (ICD10-I70.0). Electronically Signed   By: Nelson Chimes M.D.   On: 06/21/2022 17:13   DG Chest Port 1 View  Result Date: 06/21/2022 CLINICAL DATA:  Sepsis, hypotension EXAM: PORTABLE CHEST 1 VIEW COMPARISON:  04/18/2022 FINDINGS: 2 frontal views of the chest are obtained. The cardiac silhouette is stable. Chronic elevation of the right hemidiaphragm. No airspace disease, effusion, or pneumothorax. IMPRESSION: 1. No acute intrathoracic process. Electronically Signed   By: Randa Ngo M.D.   On: 06/21/2022 14:37    EKG: Independently reviewed. See above  Assessment/Plan  UTI with Sepsis  - admit to progressive care  - continue on CTX  -f/u on urine/blood  culture  -continue on ivfs  -trend inflammatory markers   AKI on CKD -monitor strict I/o  -hold nephrotoxic medications  - ivfs  -monitor labs   Constipation  -start on stool regimen   CAD s/p MI s/p stent to LAD Abn CE - stent 2010  - cath 03/2022 nonobstructive disease  -follows with [facility] Cardiology  - abnormal CE in setting sepsis /period of hypotension due to demand  -continue with plavix, asa, statin, metoprolol, nitro prn  -resume regimen once med rec has been completed  Anemia , normocytic  -check anemia labs   GERD -ppi   Chron's disease -no active flare     HLD -resume statin    Hypertension -holding anti -htn med insetting of sepsis and dehydration     ? ALS -hx debility and recurrent falls  - increase falls in setting of acute illness  -followed by neurology  - PT/OT   Chronic lower back pain  -followed by pain clinic  -resume home regime as able    Hypothyroidism -resume synthroid   Resume regimen once med rec has been completed  DVT prophylaxis: heparin Code Status:  Family Communication: Disposition Plan. patient  expected to be admitted greater than 2 midnights Consults  called: n/a Admission status: progressive care   Clance Boll MD [facility]   If 7PM-7AM, please contact night-coverage [email or web address] Password [facility]  06/21/2022, 7:35 PM

## 2022-06-21 NOTE — Progress Notes (Signed)
Pt being followed by [facility] for Sepsis protocol. 

## 2022-06-21 NOTE — ED Triage Notes (Signed)
Pt coming from home by EMS after having two falls today. The first fall was d/t his walker catching coming out of the bathroom, did not hit head. A BLS unit was called to help get him up. Then after the BLS unit got him into a chair he shortly after faceplanted out of the chair. At that time his initial pressure was in the 0000000 systolic, when the ALS unit arrived his pressure increased to [id number] systolic. Pt received 724ml's en route and pressure is still in the 80's.   Denies pain/N/V/D but yesterday he did have some nausea followed by one episode of emesis.

## 2022-06-21 NOTE — ED Notes (Signed)
ED TO INPATIENT HANDOFF REPORT  ED Nurse Name and Phone #: [phone] Darcus Edds  S Name/Age/Gender Derek Vargas 72 y.o. male Room/Bed: 032C/032C  Code Status   Code Status: Full Code  Home/SNF/Other Home Patient oriented to: self, place, time, and situation Is this baseline? Yes   Triage Complete: Triage complete  Chief Complaint Sepsis secondary to UTI [A41.9, N39.0]  Triage Note Pt coming from home by EMS after having two falls today. The first fall was d/t his walker catching coming out of the bathroom, did not hit head. A BLS unit was called to help get him up. Then after the BLS unit got him into a chair he shortly after faceplanted out of the chair. At that time his initial pressure was in the 0000000 systolic, when the ALS unit arrived his pressure increased to [id number] systolic. Pt received 71ml's en route and pressure is still in the 80's.   Denies pain/N/V/D but yesterday he did have some nausea followed by one episode of emesis.    Allergies Allergies  Allergen Reactions   Codeine Nausea And Vomiting    Level of Care/Admitting Diagnosis ED Disposition     ED Disposition  Admit   Condition  --   [place]: [place] [[id number]]  Level of Care: Progressive [102]  Admit to Progressive based on following criteria: MULTISYSTEM THREATS such as stable sepsis, metabolic/electrolyte imbalance with or without encephalopathy that is responding to early treatment.  May admit patient to [facility] or [facility] if equivalent level of care is available:: No  Covid Evaluation: Confirmed COVID Negative  Diagnosis: Sepsis secondary to UTI [id number]  Admitting Physician: Clance Boll [id number]  Attending Physician: Clance Boll [id number]  Certification:: I certify this patient will need inpatient services for at least 2 midnights  Estimated Length of Stay: 3          B Medical/Surgery History Past Medical History:  Diagnosis  Date   Alzheimer's disease ([place])    BORDERLINE per wife   Arthritis    Coronary artery disease involving native coronary artery of native heart without angina pectoris 01/10/2021   Crohn disease ([place])    GERD without esophagitis 01/10/2021   Hyperlipidemia    Hypertension    Hypothyroidism 01/10/2021   [name] disease [facility])    Mixed hyperlipidemia 01/10/2021   STEMI (ST elevation myocardial infarction) ([place]) 2010   Past Surgical History:  Procedure Laterality Date   LEG SURGERY Right    1990s?     A IV Location/Drains/Wounds Patient Lines/Drains/Airways Status     Active Line/Drains/Airways     Name Placement date Placement time Site Days   Peripheral IV 06/21/22 20 G Left Antecubital 06/21/22  --  Antecubital  less than 1   Peripheral IV 06/21/22 18 G Right Antecubital 06/21/22  1432  Antecubital  less than 1            Intake/Output Last 24 hours  Intake/Output Summary (Last 24 hours) at 06/21/2022 2054 Last data filed at 06/21/2022 2020 Gross per 24 hour  Intake 2200 ml  Output --  Net 2200 ml    Labs/Imaging Results for orders placed or performed during the hospital encounter of 06/21/22 (from the past 48 hour(s))  Comprehensive metabolic panel     Status: Abnormal   Collection Time: 06/21/22  2:04 PM  Result Value Ref Range   Sodium 136 135 - 145 mmol/L   Potassium 3.6 3.5 - 5.1 mmol/L  Chloride 97 (L) 98 - 111 mmol/L   CO2 25 22 - 32 mmol/L   Glucose, Bld 117 (H) 70 - 99 mg/dL    Comment: Glucose reference range applies only to samples taken after fasting for at least 8 hours.   BUN 18 8 - 23 mg/dL   Creatinine, Ser 1.99 (H) 0.61 - 1.24 mg/dL   Calcium 8.4 (L) 8.9 - 10.3 mg/dL   Total Protein 6.4 (L) 6.5 - 8.1 g/dL   Albumin 2.8 (L) 3.5 - 5.0 g/dL   AST 17 15 - 41 U/L   ALT 14 0 - 44 U/L   Alkaline Phosphatase 61 38 - 126 U/L   Total Bilirubin 1.0 0.3 - 1.2 mg/dL   GFR, Estimated 35 (L) >60 mL/min    Comment: (NOTE) Calculated using the  CKD-EPI Creatinine Equation (2021)    Anion gap 14 5 - 15    Comment: Performed at [facility], [place] [address] [place], [place] [address]  CBC with Differential     Status: Abnormal   Collection Time: 06/21/22  2:04 PM  Result Value Ref Range   WBC 15.8 (H) 4.0 - 10.5 K/uL   RBC 3.31 (L) 4.22 - 5.81 MIL/uL   Hemoglobin 11.0 (L) 13.0 - 17.0 g/dL   HCT 32.9 (L) 39.0 - 52.0 %   MCV 99.4 80.0 - 100.0 fL   MCH 33.2 26.0 - 34.0 pg   MCHC 33.4 30.0 - 36.0 g/dL   RDW 13.0 11.5 - 15.5 %   Platelets 201 150 - 400 K/uL   nRBC 0.0 0.0 - 0.2 %   Neutrophils Relative % 86 %   Neutro Abs 13.6 (H) 1.7 - 7.7 K/uL   Lymphocytes Relative 5 %   Lymphs Abs 0.8 0.7 - 4.0 K/uL   Monocytes Relative 8 %   Monocytes Absolute 1.3 (H) 0.1 - 1.0 K/uL   Eosinophils Relative 0 %   Eosinophils Absolute 0.0 0.0 - 0.5 K/uL   Basophils Relative 0 %   Basophils Absolute 0.1 0.0 - 0.1 K/uL   Immature Granulocytes 1 %   Abs Immature Granulocytes 0.09 (H) 0.00 - 0.07 K/uL    Comment: Performed at [facility], [address], [place], [place] [address]  Protime-INR     Status: Abnormal   Collection Time: 06/21/22  2:04 PM  Result Value Ref Range   Prothrombin Time 16.1 (H) 11.4 - 15.2 seconds   INR 1.3 (H) 0.8 - 1.2    Comment: (NOTE) INR goal varies based on device and disease states. Performed at [facility], [place] [address] [place], [place] [address]   Troponin I (High Sensitivity)     Status: Abnormal   Collection Time: 06/21/22  2:04 PM  Result Value Ref Range   Troponin I (High Sensitivity) 36 (H) <18 ng/L    Comment: (NOTE) Elevated high sensitivity troponin I (hsTnI) values and significant  changes across serial measurements may suggest ACS but many other  chronic and acute conditions are known to elevate hsTnI results.  Refer to the "Links" section for chest pain algorithms and additional  guidance. Performed at [facility], [place] [address] [place],  [place] [address]   Magnesium     Status: None   Collection Time: 06/21/22  2:04 PM  Result Value Ref Range   Magnesium 1.7 1.7 - 2.4 mg/dL    Comment: Performed at [facility], [place],  [place] [address]  Lactic acid, plasma     Status: None   Collection Time: 06/21/22  2:32 PM  Result Value Ref Range   Lactic Acid, Venous 1.9 0.5 - 1.9 mmol/L    Comment: Performed at [facility], [place] [address] [place], [place] [address]  Resp panel by RT-PCR (RSV, Flu A&B, Covid)     Status: None   Collection Time: 06/21/22  2:32 PM   Specimen: Nasal Swab  Result Value Ref Range   SARS Coronavirus 2 by RT PCR NEGATIVE NEGATIVE   Influenza A by PCR NEGATIVE NEGATIVE   Influenza B by PCR NEGATIVE NEGATIVE    Comment: (NOTE) The Xpert Xpress SARS-CoV-2/FLU/RSV plus assay is intended as an aid in the diagnosis of influenza from Nasopharyngeal swab specimens and should not be used as a sole basis for treatment. Nasal washings and aspirates are unacceptable for Xpert Xpress SARS-CoV-2/FLU/RSV testing.  Fact Sheet for Patients: EntrepreneurPulse.com.au  Fact Sheet for Healthcare Providers: IncredibleEmployment.be  This test is not yet approved or cleared by the [place] FDA and has been authorized for detection and/or diagnosis of SARS-CoV-2 by FDA under an Emergency Use Authorization (EUA). This EUA will remain in effect (meaning this test can be used) for the duration of the COVID-19 declaration under Section 564(b)(1) of the Act, 21 U.S.C. section 360bbb-3(b)(1), unless the authorization is terminated or revoked.     Resp Syncytial Virus by PCR NEGATIVE NEGATIVE    Comment: (NOTE) Fact Sheet for Patients: EntrepreneurPulse.com.au  Fact Sheet for Healthcare Providers: IncredibleEmployment.be  This test is not yet approved or cleared by the [place] FDA and has been authorized for  detection and/or diagnosis of SARS-CoV-2 by FDA under an Emergency Use Authorization (EUA). This EUA will remain in effect (meaning this test can be used) for the duration of the COVID-19 declaration under Section 564(b)(1) of the Act, 21 U.S.C. section 360bbb-3(b)(1), unless the authorization is terminated or revoked.  Performed at [facility], [place] [address], [place], [place] [address]   Lactic acid, plasma     Status: None   Collection Time: 06/21/22  3:59 PM  Result Value Ref Range   Lactic Acid, Venous 1.5 0.5 - 1.9 mmol/L    Comment: Performed at [place] [address], [place], [place] [address]  Urinalysis, w/ Reflex to Culture (Infection Suspected) -Urine, Clean Catch     Status: Abnormal   Collection Time: 06/21/22  3:59 PM  Result Value Ref Range   Specimen Source URINE, CLEAN CATCH    Color, Urine YELLOW YELLOW   APPearance HAZY (A) CLEAR   Specific Gravity, Urine 1.012 1.005 - 1.030   pH 5.0 5.0 - 8.0   Glucose, UA NEGATIVE NEGATIVE mg/dL   Hgb urine dipstick NEGATIVE NEGATIVE   Bilirubin Urine NEGATIVE NEGATIVE   Ketones, ur NEGATIVE NEGATIVE mg/dL   Protein, ur 30 (A) NEGATIVE mg/dL   Nitrite NEGATIVE NEGATIVE   Leukocytes,Ua SMALL (A) NEGATIVE   RBC / HPF 0-5 0 - 5 RBC/hpf   WBC, UA 21-50 0 - 5 WBC/hpf    Comment:        Reflex urine culture not performed if WBC <=10, OR if Squamous epithelial cells >5. If Squamous epithelial cells >5 suggest recollection.    Bacteria, UA MANY (A) NONE SEEN   Squamous Epithelial / HPF 0-5 0 - 5 /HPF   Mucus PRESENT    Hyaline Casts, UA PRESENT     Comment:  Performed at [facility], [place] [address], [place], [place] [address]  Troponin I (High Sensitivity)     Status: Abnormal   Collection Time: 06/21/22  3:59 PM  Result Value Ref Range   Troponin I (High Sensitivity) 25 (H) <18 ng/L    Comment: (NOTE) Elevated high sensitivity troponin I (hsTnI) values and significant  changes across serial  measurements may suggest ACS but many other  chronic and acute conditions are known to elevate hsTnI results.  Refer to the "Links" section for chest pain algorithms and additional  guidance. Performed at [facility], [place] [address] [place], [place] [address]   Urine Culture     Status: None (Preliminary result)   Collection Time: 06/21/22  3:59 PM   Specimen: Urine, Random  Result Value Ref Range   Specimen Description URINE, RANDOM    Special Requests      URINE, CLEAN CATCH Performed at [facility], [address], [place], [place] [address]    Culture PENDING    Report Status PENDING    CT CHEST ABDOMEN PELVIS WO CONTRAST  Result Date: 06/21/2022 CLINICAL DATA:  Sepsis EXAM: CT CHEST, ABDOMEN AND PELVIS WITHOUT CONTRAST TECHNIQUE: Multidetector CT imaging of the chest, abdomen and pelvis was performed following the standard protocol without IV contrast. RADIATION DOSE REDUCTION: This exam was performed according to the departmental dose-optimization program which includes automated exposure control, adjustment of the mA and/or kV according to patient size and/or use of iterative reconstruction technique. COMPARISON:  Chest radiography same day. CT 01/10/2021. CT 01/20/2017. FINDINGS: CT CHEST FINDINGS Cardiovascular: Heart size is normal. Coronary artery calcification and aortic atherosclerotic calcification are present. No pericardial effusion. Mediastinum/Nodes: No mass or lymphadenopathy. Lungs/Pleura: No pleural effusion. Scattered areas of pulmonary scarring. No sign of acute infectious pneumonia. No lobar collapse. No effusion. I do not believe that there is any pulmonary nodule requiring follow-up. Musculoskeletal: Unremarkable appearance of the spine, sternum and ribs. CT ABDOMEN PELVIS FINDINGS Hepatobiliary: Liver parenchyma is normal. No calcified gallstones. No CT evidence of gallbladder inflammation. Pancreas: Normal Spleen: Normal Adrenals/Urinary Tract: Adrenal  glands are normal. Kidneys are normal. No cyst, mass, stone or hydronephrosis. No evidence of renal infection. The bladder is normal. Stomach/Bowel: Stomach and small intestine are normal. Normal appendix. Fairly large amount of fecal matter in the colon, but no evidence of acute colon pathology. No diverticulosis or diverticulitis. Vascular/Lymphatic: Aortic atherosclerosis. No aneurysm. IVC is normal. No adenopathy. Reproductive: Normal Other: No free fluid or air. Musculoskeletal: Ordinary mild lower lumbar degenerative changes. Previous ORIF of a right femur fracture. IMPRESSION: 1. No acute chest finding. Scattered areas of pulmonary scarring. No sign of acute infectious pneumonia. 2. No acute abdominal or pelvic finding. 3. Aortic atherosclerosis. Coronary artery calcification. 4. Fairly large amount of fecal matter in the colon, but no evidence of acute colon pathology. Aortic Atherosclerosis (ICD10-I70.0). Electronically Signed   By: Nelson Chimes M.D.   On: 06/21/2022 17:13   DG Chest Port 1 View  Result Date: 06/21/2022 CLINICAL DATA:  Sepsis, hypotension EXAM: PORTABLE CHEST 1 VIEW COMPARISON:  04/18/2022 FINDINGS: 2 frontal views of the chest are obtained. The cardiac silhouette is stable. Chronic elevation of the right hemidiaphragm. No airspace disease, effusion, or pneumothorax. IMPRESSION: 1. No acute intrathoracic process. Electronically Signed   By: Randa Ngo M.D.   On: 06/21/2022 14:37    Pending Labs [employer] (From admission, onward)     Start  Ordered   06/22/22 0500  Comprehensive metabolic panel  Tomorrow morning,   R        06/21/22 2026   06/22/22 0500  CBC  Tomorrow morning,   R        06/21/22 2026   06/21/22 2026  Urinalysis, w/ Reflex to Culture (Infection Suspected) -Urine, Clean Catch  (Urine Culture)  Once,   R       Question:  Specimen Source  Answer:  Urine, Clean Catch   06/21/22 2026   06/21/22 2026  TSH  Once,   R        06/21/22 2026   06/21/22  2015  Procalcitonin  Once,   R       References:    Procalcitonin Lower Respiratory Tract Infection AND Sepsis Procalcitonin Algorithm   06/21/22 2026   06/21/22 2015  C-reactive protein  Once,   R        06/21/22 2026   06/21/22 2015  CBC  (heparin)  Once,   R       Comments: Baseline for heparin therapy IF NOT ALREADY DRAWN.  Notify MD if PLT < 100 K.    06/21/22 2026   06/21/22 2015  Creatinine, serum  (heparin)  Once,   R       Comments: Baseline for heparin therapy IF NOT ALREADY DRAWN.    06/21/22 2026   06/21/22 2014  HIV Antibody (routine testing w rflx)  (HIV Antibody (Routine testing w reflex) panel)  Once,   R        06/21/22 2026   06/21/22 1419  Blood Culture (routine x 2)  (Undifferentiated presentation (screening labs and basic nursing orders))  BLOOD CULTURE X 2,   STAT      06/21/22 1419            Vitals/Pain Today's Vitals   06/21/22 1945 06/21/22 2000 06/21/22 2015 06/21/22 2020  BP: (!) 149/70 (!) 155/77 (!) 157/69   Pulse: 97 99 (!) 101   Resp: 14 12 (!) 22   Temp:    98.6 F (37 C)  TempSrc:    Oral  SpO2: 97% 95% 94%   Weight:      Height:      PainSc:        Isolation Precautions No active isolations  Medications Medications  lactated ringers infusion ( Intravenous New Bag/Given 06/21/22 1707)  cefTRIAXone (ROCEPHIN) 2 g in sodium chloride 0.9 % 100 mL IVPB (has no administration in time range)  heparin injection 5,000 Units (has no administration in time range)  lactated ringers infusion (has no administration in time range)  acetaminophen (TYLENOL) tablet 650 mg (has no administration in time range)    Or  acetaminophen (TYLENOL) suppository 650 mg (has no administration in time range)  ondansetron (ZOFRAN) tablet 4 mg (has no administration in time range)    Or  ondansetron (ZOFRAN) injection 4 mg (has no administration in time range)  albuterol (PROVENTIL) (2.5 MG/3ML) 0.083% nebulizer solution 2.5 mg (has no administration in time  range)  lactated ringers bolus 1,000 mL (0 mLs Intravenous Stopped 06/21/22 1556)  cefTRIAXone (ROCEPHIN) 2 g in sodium chloride 0.9 % 100 mL IVPB (0 g Intravenous Stopped 06/21/22 1935)  lactated ringers bolus 1,000 mL (0 mLs Intravenous Stopped 06/21/22 2020)    Mobility non-ambulatory     Focused Assessments Cardiac Assessment Handoff:    No results found for: "CKTOTAL", "CKMB", "CKMBINDEX", "TROPONINI" No results found for: "DDIMER" Does  the Patient currently have chest pain? No   , Neuro Assessment Handoff:  Swallow screen pass? Yes          Neuro Assessment: Within Defined Limits Neuro Checks:      Has TPA been given? No If patient is a Neuro Trauma and patient is going to OR before floor call report to [place] nurse: [phone] or [phone]   R Recommendations: See Admitting Provider Note  Report given to:   Additional Notes:

## 2022-06-21 NOTE — ED Provider Notes (Signed)
[facility] Provider Note   CSN: [id number] Arrival date & time: 06/21/22  1353     History  Chief Complaint  Patient presents with   Fall   Hypotension    Derek Vargas is a 72 y.o. male.   Fall  Patient presents for generalized weakness.  Medical history includes CVA, CAD, hypothyroidism, HLD, GERD, recurrent falls, arthritis.  He states that he has felt generalized weakness for the past 3 days.  Yesterday, he states that he had a fever of 102 degrees.  He had an episode of emesis after eating a hamburger.  He was feeling nauseous throughout the day yesterday.  Currently, nausea has resolved.  He continues to feel weakness.  Because of his weakness, he had a fall earlier today.  EMS was called to his home after the fall.  He describes the fall as mechanical.  When EMS was on scene, they noted hypotension.  700 cc IVF was given prior to arrival.  Currently, patient denies any areas of discomfort.  He denies current nausea.  He does take blood pressure medicines and last took them at 8:00 this morning.    Home Medications Prior to Admission medications   Medication Sig Start Date End Date Taking? Authorizing Provider  aspirin EC 81 MG tablet Take 81 mg by mouth daily.    [provider]  atorvastatin (LIPITOR) 80 MG tablet Take 80 mg by mouth daily.    [provider]  baclofen (LIORESAL) 10 MG tablet Take 5-10 mg by mouth 3 (three) times daily as needed for pain. 12/26/20   [provider]  buPROPion (WELLBUTRIN XL) 150 MG 24 hr tablet Take 150 mg by mouth daily.    [provider]  clonazePAM (KLONOPIN) 1 MG tablet Take 1 mg by mouth daily. 01/29/14   [provider]  clopidogrel (PLAVIX) 75 MG tablet Take 1 tablet (75 mg total) by mouth daily. 11/20/18   Rinehuls, Early Chars, PA-C  doxepin (SINEQUAN) 100 MG capsule Take 100 mg by mouth at bedtime. 10/05/18   [provider]  FLUoxetine  (PROZAC) 40 MG capsule Take 40 mg by mouth daily.    [provider]  levothyroxine (SYNTHROID) 25 MCG tablet Take 25 mcg by mouth daily before breakfast.    [provider]  lisinopril (ZESTRIL) 5 MG tablet Take 5 mg by mouth daily.    [provider]  Melatonin 10 MG TABS Take 10 mg by mouth at bedtime.    [provider]  metoprolol tartrate (LOPRESSOR) 50 MG tablet Take 50 mg by mouth 2 (two) times daily.    [provider]  primidone (MYSOLINE) 250 MG tablet Take 1 tablet (250 mg total) by mouth 4 (four) times daily. 11/19/18   Rinehuls, Early Chars, PA-C  QUEtiapine (SEROQUEL) 300 MG tablet Take 750 mg by mouth at bedtime.    [provider]  traMADol-acetaminophen (ULTRACET) 37.5-325 MG tablet Take 1 tablet by mouth 2 (two) times daily.     [provider]      Allergies    Codeine    Review of Systems   Review of Systems  Constitutional:  Positive for activity change, appetite change, fatigue and fever.  Gastrointestinal:  Positive for nausea and vomiting.  Neurological:  Positive for weakness (Generalized).  All other systems reviewed and are negative.   Physical Exam Updated Vital Signs BP 105/64   Pulse 88   Temp 98.1 F (  36.7 C) (Oral)   Resp (!) 21   Ht 5\' 6"  (1.676 m)   Wt 83.5 kg   SpO2 95%   BMI 29.70 kg/m  Physical Exam Vitals and nursing note reviewed.  Constitutional:      General: He is not in acute distress.    Appearance: He is well-developed. He is not toxic-appearing or diaphoretic.  HENT:     Head: Normocephalic and atraumatic.     Right Ear: External ear normal.     Left Ear: External ear normal.     Nose: Nose normal.     Mouth/Throat:     Mouth: Mucous membranes are moist.  Eyes:     Extraocular Movements: Extraocular movements intact.     Conjunctiva/sclera: Conjunctivae normal.  Cardiovascular:     Rate and Rhythm: Normal rate and regular rhythm.     Heart sounds: No murmur  heard. Pulmonary:     Effort: Pulmonary effort is normal. No respiratory distress.     Breath sounds: Normal breath sounds. No wheezing or rales.  Chest:     Chest wall: No tenderness.  Abdominal:     General: There is no distension.     Palpations: Abdomen is soft.     Tenderness: There is no abdominal tenderness. There is no right CVA tenderness or left CVA tenderness.  Musculoskeletal:        General: No swelling. Normal range of motion.     Cervical back: Normal range of motion and neck supple.     Right lower leg: No edema.     Left lower leg: No edema.  Skin:    General: Skin is warm and dry.     Coloration: Skin is not jaundiced or pale.  Neurological:     General: No focal deficit present.     Mental Status: He is alert and oriented to person, place, and time.     Cranial Nerves: No cranial nerve deficit.     Sensory: No sensory deficit.     Motor: No weakness.     Coordination: Coordination normal.  Psychiatric:        Mood and Affect: Mood normal.        Behavior: Behavior normal.        Thought Content: Thought content normal.        Judgment: Judgment normal.     ED Results / Procedures / Treatments   Labs (all labs ordered are listed, but only abnormal results are displayed) Labs Reviewed  COMPREHENSIVE METABOLIC PANEL - Abnormal; Notable for the following components:      Result Value   Chloride 97 (*)    Glucose, Bld 117 (*)    Creatinine, Ser 1.99 (*)    Calcium 8.4 (*)    Total Protein 6.4 (*)    Albumin 2.8 (*)    GFR, Estimated 35 (*)    All other components within normal limits  CBC WITH DIFFERENTIAL/PLATELET - Abnormal; Notable for the following components:   WBC 15.8 (*)    RBC 3.31 (*)    Hemoglobin 11.0 (*)    HCT 32.9 (*)    Neutro Abs 13.6 (*)    Monocytes Absolute 1.3 (*)    Abs Immature Granulocytes 0.09 (*)    All other components within normal limits  PROTIME-INR - Abnormal; Notable for the following components:   Prothrombin Time  16.1 (*)    INR 1.3 (*)    All other components within normal limits  TROPONIN  I (HIGH SENSITIVITY) - Abnormal; Notable for the following components:   Troponin I (High Sensitivity) 36 (*)    All other components within normal limits  RESP PANEL BY RT-PCR (RSV, FLU A&B, COVID)  RVPGX2  CULTURE, BLOOD (ROUTINE X 2)  CULTURE, BLOOD (ROUTINE X 2)  LACTIC ACID, PLASMA  MAGNESIUM  LACTIC ACID, PLASMA  URINALYSIS, W/ REFLEX TO CULTURE (INFECTION SUSPECTED)  TROPONIN I (HIGH SENSITIVITY)    EKG EKG Interpretation  Date/Time:  Sunday June 21 2022 14:05:33 EDT Ventricular Rate:  89 PR Interval:  157 QRS Duration: 88 QT Interval:  382 QTC Calculation: 465 R Axis:   4 Text Interpretation: Sinus rhythm Confirmed by Godfrey Pick [phone]) on 06/21/2022 3:11:47 PM  Radiology DG Chest Port 1 View  Result Date: 06/21/2022 CLINICAL DATA:  Sepsis, hypotension EXAM: PORTABLE CHEST 1 VIEW COMPARISON:  04/18/2022 FINDINGS: 2 frontal views of the chest are obtained. The cardiac silhouette is stable. Chronic elevation of the right hemidiaphragm. No airspace disease, effusion, or pneumothorax. IMPRESSION: 1. No acute intrathoracic process. Electronically Signed   By: Randa Ngo M.D.   On: 06/21/2022 14:37    Procedures Procedures    Medications Ordered in ED Medications  lactated ringers bolus 1,000 mL (0 mLs Intravenous Stopped 06/21/22 1556)    ED Course/ Medical Decision Making/ A&P Clinical Course as of 06/21/22 1624  Sun Jun 21, 2022  1530 72 yo male, fever x 24 hours, weak x2-3 days, hypotensive on arrival, workup pending [SG]    Clinical Course User Index [SG] Jeanell Sparrow, DO                             Medical Decision Making Amount and/or Complexity of Data Reviewed Labs: ordered. Radiology: ordered. ECG/medicine tests: ordered.   This patient presents to the ED for concern of fall and hypotension, this involves an extensive number of treatment options, and is a complaint  that carries with it a high risk of complications and morbidity.  The differential diagnosis includes polypharmacy, dehydration, sepsis, valvular abnormality, acute injuries   Co morbidities that complicate the patient evaluation  CVA, CAD, hypothyroidism, HLD, GERD, recurrent falls, arthritis   Additional history obtained:  Additional history obtained from EMS External records from outside source obtained and reviewed including EMR   Lab Tests:  I Ordered, and personally interpreted labs.  The pertinent results include: Leukocytosis and AKI are present.  Hemoglobin is slightly decreased from 1 month ago.  There is mild elevation in troponin.  Lactate is normal.   Imaging Studies ordered:  I ordered imaging studies including chest x-ray I independently visualized and interpreted imaging which showed no acute findings I agree with the radiologist interpretation   Cardiac Monitoring: / EKG:  The patient was maintained on a cardiac monitor.  I personally viewed and interpreted the cardiac monitored which showed an underlying rhythm of: Sinus rhythm  Problem List / ED Course / Critical interventions / Medication management  Patient presents for generalized weakness.  He also reports fever of 102 degrees yesterday in addition to nausea and vomiting.  Today, generalized weakness led to a fall.  He denies any pain or suspected injury from the fall.  He has no external evidence of trauma.  He currently denies any discomfort or nausea.  Vital signs on arrival are notable for soft blood pressures.  He last took his blood pressure medications 6 hours prior to arrival.  I am  concerned of possible sepsis.  Alternatively, weakness and soft low pressures could be secondary to polypharmacy and/for dehydration.  IV fluids were ordered.  Laboratory workup was initiated.  Initial lab work is notable for leukocytosis and AKI.  Patient blood pressure improved following IV fluids.  Chest x-ray shows no  acute findings.  Remaining lab work is pending at time of signout.  Care of patient was signed out to oncoming ED provider. I ordered medication including IV fluids for soft blood pressures Reevaluation of the patient after these medicines showed that the patient improved I have reviewed the patients home medicines and have made adjustments as needed   Social Determinants of Health:  Lives at home with wife, has access to outpatient care         Final Clinical Impression(s) / ED Diagnoses Final diagnoses:  AKI (acute kidney injury) ([place])  Generalized weakness    Rx / DC Orders ED Discharge Orders     None         Godfrey Pick, MD 06/21/22 1624

## 2022-06-21 NOTE — ED Provider Notes (Signed)
  Provider Note MRN:  [id number]  Arrival date & time: 06/22/22    ED Course and Medical Decision Making  Assumed care from [facility] at shift change.  See note from prior team for complete details, in brief:  72 yo male Hx DVT, CAD, hypothyroid, HLD, GERD, recurrent falls Weakness over the past 3 days Fall earlier today, generalized weakness Hypotensive on arrival chest since improved Troponin initially elevated at 36, delta downtrending at 25, no chest pain UA with UTI, send culture, start rocephin; fever yesterday, afebrile here, leukocytosis > Episode of emesis earlier, none since then, no flank pain.  Pyelonephritis is on differential but given lack of CT evidence of this and flank pain this seems less likely AKI with creatinine 1.99 today, baseline around 1.2 +sepsis  EF 60-65%, no DD on echo 7/23, will give 2nd bolus IVF; BP improving.  Rec admission for weakness likely secondary to AKI/UTI/sepsis  Admitted Dr Marcello Moores    .Critical Care  Performed by: Jeanell Sparrow, DO Authorized by: Jeanell Sparrow, DO   Critical care provider statement:    Critical care time (minutes):  32   Critical care time was exclusive of:  Separately billable procedures and treating other patients   Critical care was necessary to treat or prevent imminent or life-threatening deterioration of the following conditions:  Sepsis, renal failure and dehydration   Critical care was time spent personally by me on the following activities:  Development of treatment plan with patient or surrogate, discussions with consultants, evaluation of patient's response to treatment, examination of patient, ordering and review of laboratory studies, ordering and review of radiographic studies, ordering and performing treatments and interventions, pulse oximetry, re-evaluation of patient's condition and review of old charts   Final Clinical Impressions(s) / ED Diagnoses     ICD-10-CM   1. AKI (acute kidney injury) ([place])   N17.9     2. Generalized weakness  R53.1     3. Urinary tract infection with hematuria, site unspecified  N39.0    R31.9     4. Sepsis, due to unspecified organism, unspecified whether acute organ dysfunction present [facility])  A41.9       ED Discharge Orders     None       Discharge Instructions   None        Jeanell Sparrow, DO 06/22/22 0025

## 2022-06-22 DIAGNOSIS — N179 Acute kidney failure, unspecified: Secondary | ICD-10-CM | POA: Diagnosis not present

## 2022-06-22 DIAGNOSIS — E782 Mixed hyperlipidemia: Secondary | ICD-10-CM

## 2022-06-22 DIAGNOSIS — F39 Unspecified mood [affective] disorder: Secondary | ICD-10-CM

## 2022-06-22 DIAGNOSIS — E039 Hypothyroidism, unspecified: Secondary | ICD-10-CM | POA: Diagnosis not present

## 2022-06-22 DIAGNOSIS — D500 Iron deficiency anemia secondary to blood loss (chronic): Secondary | ICD-10-CM | POA: Insufficient documentation

## 2022-06-22 DIAGNOSIS — G1221 Amyotrophic lateral sclerosis: Secondary | ICD-10-CM

## 2022-06-22 DIAGNOSIS — A419 Sepsis, unspecified organism: Secondary | ICD-10-CM | POA: Diagnosis not present

## 2022-06-22 DIAGNOSIS — K50919 Crohn's disease, unspecified, with unspecified complications: Secondary | ICD-10-CM

## 2022-06-22 DIAGNOSIS — K5090 Crohn's disease, unspecified, without complications: Secondary | ICD-10-CM | POA: Insufficient documentation

## 2022-06-22 DIAGNOSIS — I2510 Atherosclerotic heart disease of native coronary artery without angina pectoris: Secondary | ICD-10-CM | POA: Diagnosis not present

## 2022-06-22 DIAGNOSIS — R6520 Severe sepsis without septic shock: Secondary | ICD-10-CM

## 2022-06-22 LAB — CBC
HCT: 32.6 % — ABNORMAL LOW (ref 39.0–52.0)
Hemoglobin: 11.2 g/dL — ABNORMAL LOW (ref 13.0–17.0)
MCH: 33.3 pg (ref 26.0–34.0)
MCHC: 34.4 g/dL (ref 30.0–36.0)
MCV: 97 fL (ref 80.0–100.0)
Platelets: 197 10*3/uL (ref 150–400)
RBC: 3.36 MIL/uL — ABNORMAL LOW (ref 4.22–5.81)
RDW: 12.9 % (ref 11.5–15.5)
WBC: 14.8 10*3/uL — ABNORMAL HIGH (ref 4.0–10.5)
nRBC: 0 % (ref 0.0–0.2)

## 2022-06-22 LAB — COMPREHENSIVE METABOLIC PANEL
ALT: 14 U/L (ref 0–44)
AST: 21 U/L (ref 15–41)
Albumin: 2.9 g/dL — ABNORMAL LOW (ref 3.5–5.0)
Alkaline Phosphatase: 63 U/L (ref 38–126)
Anion gap: 13 (ref 5–15)
BUN: 18 mg/dL (ref 8–23)
CO2: 25 mmol/L (ref 22–32)
Calcium: 8.7 mg/dL — ABNORMAL LOW (ref 8.9–10.3)
Chloride: 98 mmol/L (ref 98–111)
Creatinine, Ser: 1.41 mg/dL — ABNORMAL HIGH (ref 0.61–1.24)
GFR, Estimated: 54 mL/min — ABNORMAL LOW (ref >60)
Glucose, Bld: 100 mg/dL — ABNORMAL HIGH (ref 70–99)
Potassium: 3.6 mmol/L (ref 3.5–5.1)
Sodium: 136 mmol/L (ref 135–145)
Total Bilirubin: 0.8 mg/dL (ref 0.3–1.2)
Total Protein: 6.7 g/dL (ref 6.5–8.1)

## 2022-06-22 LAB — URINE CULTURE: Culture: <10000 — AB

## 2022-06-22 MED ORDER — CLOPIDOGREL BISULFATE 75 MG PO TABS
75.0000 mg | ORAL_TABLET | Freq: Every day | ORAL | Status: DC
  Administered 2022-06-23 – 2022-06-29 (×7): 75 mg via ORAL
  Filled 2022-06-22 (×7): qty 1

## 2022-06-22 MED ORDER — POLYETHYLENE GLYCOL 3350 17 G PO PACK: 17.0000 g | PACK | Freq: Every day | ORAL | Status: DC

## 2022-06-22 MED ORDER — QUETIAPINE FUMARATE 25 MG PO TABS
750.0000 mg | ORAL_TABLET | Freq: Every day | ORAL | Status: DC
  Administered 2022-06-22 – 2022-06-28 (×7): 750 mg via ORAL
  Filled 2022-06-22 (×7): qty 2

## 2022-06-22 MED ORDER — FLUOXETINE HCL 20 MG PO CAPS
40.0000 mg | ORAL_CAPSULE | Freq: Every day | ORAL | Status: DC
  Administered 2022-06-23 – 2022-06-29 (×7): 40 mg via ORAL
  Filled 2022-06-22 (×7): qty 2

## 2022-06-22 MED ORDER — ASPIRIN 81 MG PO TBEC
81.0000 mg | DELAYED_RELEASE_TABLET | Freq: Every day | ORAL | Status: DC
  Administered 2022-06-23 – 2022-06-29 (×7): 81 mg via ORAL
  Filled 2022-06-22 (×7): qty 1

## 2022-06-22 MED ORDER — BUPROPION HCL ER (XL) 150 MG PO TB24
150.0000 mg | ORAL_TABLET | Freq: Every day | ORAL | Status: DC
  Administered 2022-06-23 – 2022-06-28 (×6): 150 mg via ORAL
  Filled 2022-06-22 (×6): qty 1

## 2022-06-22 MED ORDER — METOPROLOL TARTRATE 50 MG PO TABS
50.0000 mg | ORAL_TABLET | Freq: Two times a day (BID) | ORAL | Status: DC
  Administered 2022-06-22 – 2022-06-29 (×14): 50 mg via ORAL
  Filled 2022-06-22 (×8): qty 1
  Filled 2022-06-22: qty 2
  Filled 2022-06-22 (×5): qty 1

## 2022-06-22 MED ORDER — DOCUSATE SODIUM 100 MG PO CAPS: 100.0000 mg | ORAL_CAPSULE | Freq: Two times a day (BID) | ORAL | Status: DC | PRN

## 2022-06-22 MED ORDER — ATORVASTATIN CALCIUM 80 MG PO TABS
80.0000 mg | ORAL_TABLET | Freq: Every day | ORAL | Status: DC
  Administered 2022-06-22 – 2022-06-28 (×7): 80 mg via ORAL
  Filled 2022-06-22 (×7): qty 1

## 2022-06-22 MED ORDER — SODIUM CHLORIDE 0.9 % IV BOLUS
1000.0000 mL | Freq: Once | INTRAVENOUS | Status: AC
  Administered 2022-06-22: 1000 mL via INTRAVENOUS

## 2022-06-22 MED ORDER — LEVOTHYROXINE SODIUM 25 MCG PO TABS
25.0000 ug | ORAL_TABLET | Freq: Every day | ORAL | Status: DC
  Administered 2022-06-23 – 2022-06-29 (×7): 25 ug via ORAL
  Filled 2022-06-22 (×7): qty 1

## 2022-06-22 MED ORDER — HYDROCODONE-ACETAMINOPHEN 5-325 MG PO TABS
1.0000 | ORAL_TABLET | Freq: Four times a day (QID) | ORAL | Status: DC | PRN
  Administered 2022-06-22 – 2022-06-26 (×3): 1 via ORAL
  Filled 2022-06-22 (×3): qty 1

## 2022-06-22 MED ORDER — LACTATED RINGERS IV SOLN: INTRAVENOUS | Status: AC

## 2022-06-22 NOTE — Assessment & Plan Note (Signed)
-   Continue home bupropion, fluoxetine, Seroquel

## 2022-06-22 NOTE — Assessment & Plan Note (Addendum)
Does not appear to be on disease modifying therapy.  Follows with [facility].  Last appt in computer was 2016.

## 2022-06-22 NOTE — Assessment & Plan Note (Signed)
TSH low.  No symptoms of hyperthyroidism. - Continue levothyroxine -Repeat TSH, T3, and free T4 in 2 to 4 weeks

## 2022-06-22 NOTE — Progress Notes (Signed)
  Transition of Care [facility]) Screening Note   Patient Details  Name: Derek Vargas Date of Birth: 08/24/1950   Transition of Care [facility]) CM/SW Contact:    Cyndi Bender, RN Phone Number: 06/22/2022, 9:07 AM    Transition of Care Department [facility]) has reviewed patient and no TOC needs have been identified at this time. We will continue to monitor patient advancement through interdisciplinary progression rounds. If new patient transition needs arise, please place a TOC consult.

## 2022-06-22 NOTE — Progress Notes (Signed)
Patient transferred to 2W10 via bed, escorted by RN. Bedside handoff completed with 2W RN Mateo Flow at this time. Care turned over at this time. Attempted to notify family of transfer to different unit, both numbers listed on chart are not in service. Unable to leave voicemail.

## 2022-06-22 NOTE — Hospital Course (Addendum)
Derek Vargas is a 72 y.o. M with hx TBI, Crohn's disease, hypothyroidism, HTN, and CAD s/p remote stent who presented with fall, fever, found to have UTI sepsis.His cultures eventually grew Klebsiella which was sensitive to IV Rocephin. Hospital course complicated by development of diarrhea. Eventually C. difficile was negative. GI panel was positive for norovirus. PT recommended SNF. SNF wants him to wait at least 5 days after his diarrhea has resolved. Earliest we can admit is 4/8

## 2022-06-22 NOTE — Assessment & Plan Note (Signed)
-   Continue aspirin, Plavix, atorvastatin, metoprolol - Hold lisinopril, furosemide

## 2022-06-22 NOTE — Assessment & Plan Note (Signed)
Patient has a diagnosis of ALS and discharged.  He tells me this was made when he was 72 years old, however he he never had progression of symptoms, and it appears this was an mistaken diagnosis.

## 2022-06-22 NOTE — Progress Notes (Signed)
  Progress Note   Patient: Derek Vargas [id number] DOB: [birth year] DOA: 06/21/2022     1 DOS: the patient was seen and examined on 06/22/2022 at 1:23PM      Brief hospital course: Derek Vargas is a 72 y.o. M with hx TBI, Crohn's disease, hypothyroidism, HTN, and CAD s/p remote stent who presented with fall, fever, found to have UTI sepsis.     Assessment and Plan: * Sepsis with end organ damage due to UTI Admitted with tachycardia, leukocytosis, AKI (cr more than doubled from baseline up to 2 mg/dL) due to sepsis - Continue IV fluids - Continue Rocephin - Follow urine culture    Coronary artery disease involving native coronary artery of native heart without angina pectoris - Continue aspirin, Plavix, atorvastatin, metoprolol - Hold lisinopril, furosemide  Hypothyroidism TSH low.  No symptoms of hyperthyroidism. - Continue levothyroxine -Repeat TSH, T3, and free T4 in 2 to 4 weeks  Mixed hyperlipidemia - Continue atorvastatin  Mood disorder/history traumatic brain injury - Continue home bupropion, fluoxetine, Seroquel  ALS ruled out Patient has a diagnosis of ALS and discharged.  He tells me this was made when he was 72 years old, however he he never had progression of symptoms, and it appears this was an mistaken diagnosis.  Crohn disease Does not appear to be on disease modifying therapy.  Follows with [facility].  Last appt in computer was 2016.  Iron deficiency anemia due to chronic blood loss Mild anemia, no clinical blood loss.  Iron studies show some iron deficiency. - Outpatient GI follow-up recommended - Start oral iron  AKI (acute kidney injury) CKD ruled out, baseline Cr 1.0 last Oct. Creatinine 1.99 on admission here, improving is 1.4 with fluids overnight -Continue IV fluids  Myocardial injury Minimally elevated troponin, no chest pain or ECG changes to suggest ischemia.  Likely myocardial injury from sepsis.          Subjective: Patient  is feeling gradually better overnight, no confusion, no further fever, no respiratory distress.     Physical Exam: BP 136/71 (BP Location: Right Arm)   Pulse 96   Temp 98.2 F (36.8 C) (Oral)   Resp 18   Ht 5\' 5"  (1.651 m)   Wt 81 kg   SpO2 95%   BMI 29.72 kg/m   Elderly adult male, lying in bed, interactive and appropriate Tachycardic, regular, no murmurs, no peripheral edema Respiratory rate normal, lungs clear without rales or wheezes Abdomen soft without tenderness palpation or guarding Inattentive to conversation, but oriented to person, place, and time, strength generally weak but symmetric in upper and lower extremities bilaterally, face symmetric, speech fluent, just rambling and tangential    Data Reviewed: CBC shows mild anemia, normocytic Iron studies show low iron saturation Basic metabolic panel shows creatinine down to 1.4 White blood cell count still 14 TSH level    Family Communication: Wife, daughter-in-law, and granddaughter at the bedside    Disposition: Status is: Inpatient The patient was admitted with UTI sepsis  He is improving but still tachycardic, will require ongoing IV fluids and antibiotics  Over the next 24 to 48 hours, we will discharge his disposition (SNF versus home with home health), his culture will result and we can discharge on orals        Author: Edwin Dada, MD 06/22/2022 2:08 PM  For on call review [email or web address].

## 2022-06-22 NOTE — Assessment & Plan Note (Addendum)
Admitted with tachycardia, leukocytosis, AKI (cr more than doubled from baseline up to 2 mg/dL) due to sepsis - Continue IV fluids - Continue Rocephin - Follow urine culture

## 2022-06-22 NOTE — Assessment & Plan Note (Signed)
Continue atorvastatin

## 2022-06-22 NOTE — Assessment & Plan Note (Addendum)
Mild anemia, no clinical blood loss.  Iron studies show some iron deficiency. - Outpatient GI follow-up recommended - Start oral iron

## 2022-06-22 NOTE — Evaluation (Signed)
Physical Therapy Evaluation Patient Details Name: Derek Vargas MRN: [id number] DOB: 1950-12-03 Today's Date: 06/22/2022  History of Present Illness  72 y.o. presented to ED via EMS with fall, fever, found to have UTI sepsis. PMH: hypertension, hyperlipidemia, coronary artery disease (Hx STEMI with BMS to LAD 2010), anxiety disorder, hypothyroidism, essential tremor,  gastroesophageal reflux disease, Alzheimer's dementia, ALS, Crohn's disease, stroke (10/2018 S/P TPA)  Clinical Impression  PTA pt living with spouse in single story apartment with level entry. Pt reports ambulation with Rollator and wheelchair use for longer distances. Pt reports bathing himself every other day and dressing, assists with cleaning up the kitchen. Pt reports 8-10 falls over the last 2 weeks, upon getting up. Pt is limited in safe mobility by orthostatic hypotension, in presence of decreased strength and balance and impaired coordination. Pt requires min A for coming to seated and mod-max A to come to standing. Pt with need for stabilization in sitting and standing due to R posterior lean. Ambulation deferred due to increased dizziness and balance, although pt with poor safety awareness reporting he thinks he could walk. Patient will benefit from continued inpatient follow up therapy, <3 hours/day. PT will continue to follow acutely.  Orthostatic BPs  Supine 166/80  Sitting 107/66  Sitting after 3 min 139/72  Standing Unable to get BP due to tremor and pushing, reports dizziness  Return to seated  123/80  Return to supine at end of session  178/80           Recommendations for follow up therapy are one component of a multi-disciplinary discharge planning process, led by the attending physician.  Recommendations may be updated based on patient status, additional functional criteria and insurance authorization.  Follow Up Recommendations Can patient physically be transported by private vehicle: No      Assistance Recommended at Discharge Frequent or constant Supervision/Assistance  Patient can return home with the following  Two people to help with walking and/or transfers;Two people to help with bathing/dressing/bathroom;Assistance with cooking/housework;Direct supervision/assist for medications management;Direct supervision/assist for financial management;Assist for transportation;Help with stairs or ramp for entrance    Equipment Recommendations None recommended by PT  Recommendations for Other Services  OT consult    Functional Status Assessment Patient has had a recent decline in their functional status and demonstrates the ability to make significant improvements in function in a reasonable and predictable amount of time.     Precautions / Restrictions Precautions Precautions: Fall Precaution Comments: reports 8/10 falls over last week      Mobility  Bed Mobility Overal bed mobility: Needs Assistance Bed Mobility: Supine to Sit, Sit to Supine     Supine to sit: HOB elevated, Min guard Sit to supine: Min guard   General bed mobility comments: increased time and effort and heavy use of bed rail to come to EoB, min guard for return to bed able to pull himself up with bed rails and utilizing LE to push up in bed    Transfers Overall transfer level: Needs assistance Equipment used: Rolling walker (2 wheels) Transfers: Sit to/from Stand Sit to Stand: Mod assist, Max assist           General transfer comment: pt requiring mod-maxA for coming to fully upright in RW and for attaining balance, pt attempts x3 with dizziness in all three bouts, able to perform marching in place on secont 2 attempts, increased push towards R with standing    Ambulation/Gait  General Gait Details: unable due to unsteadiness        Balance Overall balance assessment: Needs assistance Sitting-balance support: Feet supported, No upper extremity supported, Bilateral  upper extremity supported, Single extremity supported Sitting balance-Leahy Scale: Poor Sitting balance - Comments: requires outside assist to keep upright 1x LoB posterior R requiring modA to reright Postural control: Right lateral lean, Posterior lean Standing balance support: During functional activity, Bilateral upper extremity supported, Reliant on assistive device for balance Standing balance-Leahy Scale: Zero                               Pertinent Vitals/Pain Pain Assessment Pain Assessment: No/denies pain (however reports he will need his hydrocodone for his OA or else he will have increased pain)    Home Living Family/patient expects to be discharged to:: Private residence Living Arrangements: Spouse/significant other Available Help at Discharge: Available 24 hours/day Type of Home: Apartment Home Access: Level entry       Home Layout: One level Home Equipment: Rollator (4 wheels);Wheelchair - manual;BSC/3in1;Cane - single point      Prior Function Prior Level of Function : History of Falls (last six months);Needs assist             Mobility Comments: reports ambulation with Rollator or propulsion in wheelchair, ADLs Comments: independence with ADLS, assit with iADLs,     Hand Dominance   Dominant Hand: Right    Extremity/Trunk Assessment   Upper Extremity Assessment Upper Extremity Assessment: Defer to OT evaluation    Lower Extremity Assessment Lower Extremity Assessment: RLE deficits/detail;LLE deficits/detail RLE Deficits / Details: ROM WFL, strength grossly 3+/5, RLE Coordination: decreased fine motor;decreased gross motor LLE Deficits / Details: ROM WFL, strength in hip and knee flexion 3/5 LLE Coordination: decreased fine motor;decreased gross motor       Communication   Communication: Expressive difficulties (stuttering)  Cognition Arousal/Alertness: Awake/alert Behavior During Therapy: Flat affect Overall Cognitive Status:  History of cognitive impairments - at baseline                                 General Comments: granddaughter Ubaldo Glassing present and helps to clarify home set up and PLOF        General Comments General comments (skin integrity, edema, etc.): pt with orthostatic hypotension, SpO2 97%O2 on RA, HR in 120s with standing        Assessment/Plan    PT Assessment Patient needs continued PT services  PT Problem List Decreased strength;Decreased range of motion;Decreased activity tolerance;Decreased balance;Decreased mobility;Decreased coordination;Decreased cognition;Cardiopulmonary status limiting activity       PT Treatment Interventions DME instruction;Gait training;Functional mobility training;Therapeutic activities;Therapeutic exercise;Balance training;Cognitive remediation;Patient/family education    PT Goals (Current goals can be found in the Care Plan section)  Acute Rehab PT Goals PT Goal Formulation: With patient/family Time For Goal Achievement: 07/06/22 Potential to Achieve Goals: Fair    Frequency Min 3X/week     AM-PAC PT "6 Clicks" Mobility  Outcome Measure Help needed turning from your back to your side while in a flat bed without using bedrails?: A Little Help needed moving from lying on your back to sitting on the side of a flat bed without using bedrails?: A Little Help needed moving to and from a bed to a chair (including a wheelchair)?: Total Help needed standing up from a chair using your arms (e.g., wheelchair  or bedside chair)?: A Lot Help needed to walk in hospital room?: Total Help needed climbing 3-5 steps with a railing? : Total 6 Click Score: 11    End of Session Equipment Utilized During Treatment: Gait belt Activity Tolerance: Patient tolerated treatment well Patient left: in bed;with call bell/phone within reach;with bed alarm set Nurse Communication: Mobility status;Other (comment) (need for bed pan) PT Visit Diagnosis: Unsteadiness  on feet (R26.81);Other abnormalities of gait and mobility (R26.89);Repeated falls (R29.6);Muscle weakness (generalized) (M62.81);History of falling (Z91.81);Difficulty in walking, not elsewhere classified (R26.2);Dizziness and giddiness (R42)    Time: [id number] PT Time Calculation (min) (ACUTE ONLY): 46 min   Charges:   PT Evaluation $PT Eval Moderate Complexity: 1 Mod PT Treatments $Therapeutic Activity: 23-37 mins        Janika Jedlicka B. Migdalia Dk PT, DPT Acute Rehabilitation Services Please use secure chat or  Call Office [phone]   [place] 06/22/2022, 4:31 PM

## 2022-06-22 NOTE — Assessment & Plan Note (Addendum)
CKD ruled out, baseline Cr 1.0 last Oct. Creatinine 1.99 on admission here, improving is 1.4 with fluids overnight -Continue IV fluids

## 2022-06-23 DIAGNOSIS — A419 Sepsis, unspecified organism: Secondary | ICD-10-CM | POA: Diagnosis not present

## 2022-06-23 DIAGNOSIS — R6520 Severe sepsis without septic shock: Secondary | ICD-10-CM | POA: Diagnosis not present

## 2022-06-23 LAB — CBC
HCT: 30.5 % — ABNORMAL LOW (ref 39.0–52.0)
Hemoglobin: 10.3 g/dL — ABNORMAL LOW (ref 13.0–17.0)
MCH: 32.8 pg (ref 26.0–34.0)
MCHC: 33.8 g/dL (ref 30.0–36.0)
MCV: 97.1 fL (ref 80.0–100.0)
Platelets: 226 10*3/uL (ref 150–400)
RBC: 3.14 MIL/uL — ABNORMAL LOW (ref 4.22–5.81)
RDW: 12.7 % (ref 11.5–15.5)
WBC: 11.1 10*3/uL — ABNORMAL HIGH (ref 4.0–10.5)
nRBC: 0 % (ref 0.0–0.2)

## 2022-06-23 LAB — BASIC METABOLIC PANEL
Anion gap: 12 (ref 5–15)
BUN: 13 mg/dL (ref 8–23)
CO2: 26 mmol/L (ref 22–32)
Calcium: 8.4 mg/dL — ABNORMAL LOW (ref 8.9–10.3)
Chloride: 100 mmol/L (ref 98–111)
Creatinine, Ser: 1.04 mg/dL (ref 0.61–1.24)
GFR, Estimated: >60 mL/min (ref >60)
Glucose, Bld: 96 mg/dL (ref 70–99)
Potassium: 3.3 mmol/L — ABNORMAL LOW (ref 3.5–5.1)
Sodium: 138 mmol/L (ref 135–145)

## 2022-06-23 LAB — URINE CULTURE: Culture: >=100000 — AB

## 2022-06-23 LAB — MAGNESIUM: Magnesium: 2 mg/dL (ref 1.7–2.4)

## 2022-06-23 LAB — C DIFFICILE QUICK SCREEN W PCR REFLEX
C Diff antigen: NEGATIVE
C Diff interpretation: NOT DETECTED
C Diff toxin: NEGATIVE

## 2022-06-23 MED ORDER — POLYVINYL ALCOHOL 1.4 % OP SOLN: 1.0000 [drp] | OPHTHALMIC | Status: DC | PRN

## 2022-06-23 MED ORDER — LORATADINE 10 MG PO TABS: 10.0000 mg | ORAL_TABLET | Freq: Every day | ORAL | Status: DC | PRN

## 2022-06-23 MED ORDER — GUAIFENESIN 100 MG/5ML PO LIQD: 5.0000 mL | ORAL | Status: DC | PRN

## 2022-06-23 MED ORDER — ONDANSETRON HCL 4 MG/2ML IJ SOLN: 4.0000 mg | Freq: Four times a day (QID) | INTRAMUSCULAR | Status: DC | PRN

## 2022-06-23 MED ORDER — FOLIC ACID 1 MG PO TABS
1.0000 mg | ORAL_TABLET | Freq: Every day | ORAL | Status: DC
  Administered 2022-06-23 – 2022-06-29 (×7): 1 mg via ORAL
  Filled 2022-06-23 (×7): qty 1

## 2022-06-23 MED ORDER — TRAZODONE HCL 50 MG PO TABS
50.0000 mg | ORAL_TABLET | Freq: Every evening | ORAL | Status: DC | PRN
  Filled 2022-06-23: qty 1

## 2022-06-23 MED ORDER — FERROUS SULFATE 325 (65 FE) MG PO TABS
325.0000 mg | ORAL_TABLET | Freq: Every day | ORAL | Status: DC
  Administered 2022-06-24 – 2022-06-29 (×6): 325 mg via ORAL
  Filled 2022-06-23 (×6): qty 1

## 2022-06-23 MED ORDER — VITAMIN B-12 1000 MCG PO TABS
1000.0000 ug | ORAL_TABLET | Freq: Every day | ORAL | Status: DC
  Administered 2022-06-23 – 2022-06-29 (×7): 1000 ug via ORAL
  Filled 2022-06-23 (×7): qty 1

## 2022-06-23 MED ORDER — SENNOSIDES-DOCUSATE SODIUM 8.6-50 MG PO TABS: 1.0000 | ORAL_TABLET | Freq: Every evening | ORAL | Status: DC | PRN

## 2022-06-23 MED ORDER — HYDROCORTISONE 1 % EX CREA: 1.0000 | TOPICAL_CREAM | Freq: Three times a day (TID) | CUTANEOUS | Status: DC | PRN

## 2022-06-23 MED ORDER — LIP MEDEX EX OINT: 1.0000 | TOPICAL_OINTMENT | CUTANEOUS | Status: DC | PRN

## 2022-06-23 MED ORDER — MUSCLE RUB 10-15 % EX CREA: 1.0000 | TOPICAL_CREAM | CUTANEOUS | Status: DC | PRN

## 2022-06-23 MED ORDER — HYDROCORTISONE (PERIANAL) 2.5 % EX CREA: 1.0000 | TOPICAL_CREAM | Freq: Four times a day (QID) | CUTANEOUS | Status: DC | PRN

## 2022-06-23 MED ORDER — PHENOL 1.4 % MT LIQD: 1.0000 | OROMUCOSAL | Status: DC | PRN

## 2022-06-23 MED ORDER — POTASSIUM CHLORIDE CRYS ER 20 MEQ PO TBCR
40.0000 meq | EXTENDED_RELEASE_TABLET | Freq: Once | ORAL | Status: AC
  Administered 2022-06-23: 40 meq via ORAL
  Filled 2022-06-23: qty 2

## 2022-06-23 MED ORDER — SALINE SPRAY 0.65 % NA SOLN: 1.0000 | NASAL | Status: DC | PRN

## 2022-06-23 MED ORDER — IPRATROPIUM-ALBUTEROL 0.5-2.5 (3) MG/3ML IN SOLN: 3.0000 mL | RESPIRATORY_TRACT | Status: DC | PRN

## 2022-06-23 MED ORDER — ALUM & MAG HYDROXIDE-SIMETH 200-200-20 MG/5ML PO SUSP: 30.0000 mL | ORAL | Status: DC | PRN

## 2022-06-23 NOTE — Evaluation (Signed)
Occupational Therapy Evaluation Patient Details Name: Derek Vargas MRN: [id number] DOB: 1951-03-19 Today's Date: 06/23/2022   History of Present Illness 72 y.o. presented to ED via EMS with fall, fever, found to have UTI sepsis. PMH: hypertension, hyperlipidemia, coronary artery disease (Hx STEMI with BMS to LAD 2010), anxiety disorder, hypothyroidism, essential tremor,  gastroesophageal reflux disease, Alzheimer's dementia, ALS, Crohn's disease, stroke (10/2018 S/P TPA)   Clinical Impression   Derek Vargas was evaluated s/p the above admission list. He reports being mod I for ADLs at baseline, but has had several falls recently. Upon evaluation he was limited by generalized weakness, decreased activity tolerance and recent history of orthostatic hypotension. Overall he required min G for bed mobility, transfers and room mobility with RW. BP was stable throughout all position changes and after mobility. Due to the deficits listed below he also requires up to mod A for LB ADLs and set up A for UB ADLs. Pt will benefit from continued acute OT services. Pt will benefit from skilled inpatient follow up therapy, <3 hours/day, due to multiple recent falls and limited home support.       Recommendations for follow up therapy are one component of a multi-disciplinary discharge planning process, led by the attending physician.  Recommendations may be updated based on patient status, additional functional criteria and insurance authorization.   Assistance Recommended at Discharge Frequent or constant Supervision/Assistance  Patient can return home with the following A little help with walking and/or transfers;A little help with bathing/dressing/bathroom;Assistance with cooking/housework;Assist for transportation;Help with stairs or ramp for entrance    Functional Status Assessment  Patient has had a recent decline in their functional status and demonstrates the ability to make significant improvements in  function in a reasonable and predictable amount of time.  Equipment Recommendations  None recommended by OT       Precautions / Restrictions Precautions Precautions: Fall Precaution Comments: reports 8/10 falls over last week. watch BP Restrictions Weight Bearing Restrictions: No      Mobility Bed Mobility Overal bed mobility: Needs Assistance Bed Mobility: Supine to Sit     Supine to sit: Min guard          Transfers Overall transfer level: Needs assistance Equipment used: Rolling walker (2 wheels) Transfers: Sit to/from Stand Sit to Stand: Min guard           General transfer comment: slightly elevated surface      Balance Overall balance assessment: Needs assistance Sitting-balance support: Feet supported, No upper extremity supported, Bilateral upper extremity supported, Single extremity supported Sitting balance-Leahy Scale: Good     Standing balance support: Single extremity supported Standing balance-Leahy Scale: Fair Standing balance comment: able to statically stand with only 1 UE supported to check BP                           ADL either performed or assessed with clinical judgement   ADL Overall ADL's : Needs assistance/impaired Eating/Feeding: Independent;Sitting   Grooming: Set up;Sitting   Upper Body Bathing: Set up;Sitting   Lower Body Bathing: Moderate assistance;Sit to/from stand   Upper Body Dressing : Set up;Sitting   Lower Body Dressing: Moderate assistance;Sit to/from stand   Toilet Transfer: Min guard;Ambulation;Rolling walker (2 wheels)   Toileting- Clothing Manipulation and Hygiene: Min guard;Sitting/lateral lean       Functional mobility during ADLs: Min guard;Rolling walker (2 wheels) General ADL Comments: not orthostatic this date, no report of dizziness  Vision Baseline Vision/History: 0 No visual deficits Vision Assessment?: No apparent visual deficits     Perception Perception Perception Tested?:  No   Praxis Praxis Praxis tested?: Not tested    Pertinent Vitals/Pain Pain Assessment Pain Assessment: No/denies pain     Hand Dominance Right   Extremity/Trunk Assessment Upper Extremity Assessment Upper Extremity Assessment: Overall WFL for tasks assessed   Lower Extremity Assessment Lower Extremity Assessment: Defer to PT evaluation   Cervical / Trunk Assessment Cervical / Trunk Assessment: Kyphotic (mild)   Communication Communication Communication: Expressive difficulties   Cognition Arousal/Alertness: Awake/alert Behavior During Therapy: Flat affect Overall Cognitive Status: History of cognitive impairments - at baseline                                 General Comments: oriented and following commands appropriately. did not assess higher level cog     General Comments  BP stable throughout this date     Home Living Family/patient expects to be discharged to:: Private residence Living Arrangements: Spouse/significant other Available Help at Discharge: Available 24 hours/day Type of Home: Apartment Home Access: Level entry     [place]: One level     Bathroom Shower/Tub: Occupational psychologist: Standard Bathroom Accessibility: Yes   Home Equipment: Rollator (4 wheels);Wheelchair - manual;BSC/3in1;Cane - single point          Prior Functioning/Environment Prior Level of Function : History of Falls (last six months);Needs assist             Mobility Comments: reports ambulation with Rollator or propulsion in wheelchair, ADLs Comments: independence with ADLS, assit with iADLs,        OT Problem List: Decreased range of motion;Decreased strength;Decreased activity tolerance;Impaired balance (sitting and/or standing);Decreased cognition;Decreased safety awareness;Decreased knowledge of use of DME or AE      OT Treatment/Interventions: Self-care/ADL training;Therapeutic exercise;DME and/or AE instruction;Therapeutic  activities;Patient/family education;Balance training    OT Goals(Current goals can be found in the care plan section) Acute Rehab OT Goals Patient Stated Goal: to feel better OT Goal Formulation: With patient Time For Goal Achievement: 07/07/22 Potential to Achieve Goals: Good ADL Goals Pt Will Perform Grooming: with supervision;standing Pt Will Perform Lower Body Dressing: with supervision;sit to/from stand Pt Will Transfer to Toilet: with supervision;ambulating Additional ADL Goal #1: Pt will indep recall at least 3 fall prevention strategies to apply at discharge  OT Frequency: Min 2X/week       AM-PAC OT "6 Clicks" Daily Activity     Outcome Measure Help from another person eating meals?: None Help from another person taking care of personal grooming?: A Little Help from another person toileting, which includes using toliet, bedpan, or urinal?: A Little Help from another person bathing (including washing, rinsing, drying)?: A Little Help from another person to put on and taking off regular upper body clothing?: None Help from another person to put on and taking off regular lower body clothing?: A Little 6 Click Score: 20   End of Session Equipment Utilized During Treatment: Rolling walker (2 wheels);Gait belt Nurse Communication: Mobility status  Activity Tolerance: Patient tolerated treatment well Patient left: in bed  OT Visit Diagnosis: Unsteadiness on feet (R26.81);Other abnormalities of gait and mobility (R26.89);Muscle weakness (generalized) (M62.81)                Time: [id number] OT Time Calculation (min): 21 min Charges:  OT Evaluation $OT Eval  Moderate Complexity: 1 Mod  Shade Flood, OTR/L [place] [facility] [place] Communication Preferred   Elliot Cousin 06/23/2022, 3:05 PM

## 2022-06-23 NOTE — Progress Notes (Signed)
PROGRESS NOTE    Derek Vargas  [id number] DOB: January 18, 1951 DOA: 06/21/2022 PCP: Venetia Maxon, Sharon Mt, MD   Brief Narrative:  72 year old with history of Crohn's disease, TBI, hypothyroidism, HTN, CAD status post remote stent comes to the hospital with fall, fevers.  Patient was found to have urinary tract infection.  His cultures eventually grew Klebsiella which was sensitive to IV Rocephin.  Hospital course complicated by development of diarrhea.   Assessment & Plan:  Principal Problem:   Sepsis with end organ damage due to UTI Active Problems:   Coronary artery disease involving native coronary artery of native heart without angina pectoris   Hypothyroidism   Mixed hyperlipidemia   AKI (acute kidney injury)   Iron deficiency anemia due to chronic blood loss   Crohn disease   ALS ruled out   Mood disorder/history traumatic brain injury     Assessment and Plan: * Sepsis with end organ damage due to UTI Sepsis physiology is improving.  Urine cultures growing Klebsiella.  Sensitivities reviewed.  Continue IV Rocephin.  Will plan for total of 7-day course.  CT abdomen pelvis is negative, no evidence of pyelonephritis at this time  Diarrhea - Nonspecific.  Given previous history of Crohn's, will check stool studies for C. difficile and GI panel.  Abdomen is nontender nondistended.  Recent CT abdomen pelvis on 3/31 was negative for any acute pathology.   Coronary artery disease involving native coronary artery of native heart without angina pectoris Currently remains chest pain-free.  Lisinopril and Lasix on hold.  Will continue aspirin, Plavix and Lipitor  Hypothyroidism TSH low.  No symptoms of hyperthyroidism. - Continue levothyroxine -Repeat TSH, T3, and free T4 in 2 to 4 weeks  Mixed hyperlipidemia - Continue atorvastatin  Mood disorder/history traumatic brain injury - Continue home bupropion, fluoxetine, Seroquel  ALS ruled out Patient has a diagnosis of ALS  and discharged.  He tells me this was made when he was 72 years old, however he he never had progression of symptoms, and it appears this was an mistaken diagnosis.  Crohn disease Does not appear to be on disease modifying therapy.  Follows with [facility].  Last appt in computer was 2016.  Iron deficiency anemia due to chronic blood loss Folic acid deficiency Mild anemia secondary to iron deficiency.  Will start supplements.  Will also place him on bowel regimen Folic acid supplements. Low normal vitamin B12, will also place him on vitamin B12 supplements These levels can be rechecked by PCP in next 6-8 weeks.  AKI (acute kidney injury) CKD ruled out, baseline Cr 1.0 last Oct. Creatinine during this admission peaked at 1.99.  This morning gets close to baseline    DVT prophylaxis: Subcu heparin Code Status: Full code Family Communication:   Status is: Inpatient Continue hospital stay.  Patient developed significant diarrhea this morning     Diet Orders (From admission, onward)     Start     Ordered   06/21/22 2017  Diet Heart Room service appropriate? Yes; Fluid consistency: Thin  Diet effective now       Question Answer Comment  Room service appropriate? Yes   Fluid consistency: Thin      06/21/22 2026            Subjective: Patient tells me overall he feels very weak.  Had about 7 episodes of diarrhea in last 5 to 6 hours. Remains afebrile overnight.  Denies any abdominal pain   Examination:  General exam: Appears calm  and comfortable, fatigued Respiratory system: Clear to auscultation. Respiratory effort normal. Cardiovascular system: S1 & S2 heard, RRR. No JVD, murmurs, rubs, gallops or clicks. No pedal edema. Gastrointestinal system: Abdomen is nondistended, soft and nontender. No organomegaly or masses felt. Normal bowel sounds heard. Central nervous system: Alert and oriented. No focal neurological deficits. Extremities: Symmetric 5 x 5 power. Skin: No  rashes, lesions or ulcers Psychiatry: Judgement and insight appear normal. Mood & affect appropriate. \  Objective: Vitals:   06/22/22 1934 06/23/22 0451 06/23/22 0500 06/23/22 0734  BP: (!) 161/85 (!) 150/72  (!) 154/77  Pulse: (!) 103 85  85  Resp: 18 18  18   Temp: 98.9 F (37.2 C) 99.9 F (37.7 C)  98.8 F (37.1 C)  TempSrc: Oral Oral  Oral  SpO2: (!) 88% 95%  95%  Weight:   79.6 kg   Height:        Intake/Output Summary (Last 24 hours) at 06/23/2022 1041 Last data filed at 06/23/2022 0911 Gross per 24 hour  Intake --  Output 1700 ml  Net -1700 ml   Filed Weights   06/21/22 2146 06/22/22 0500 06/23/22 0500  Weight: 80.4 kg 81 kg 79.6 kg    Scheduled Meds:  aspirin EC  81 mg Oral Daily   atorvastatin  80 mg Oral QHS   buPROPion  150 mg Oral Daily   clopidogrel  75 mg Oral Daily   FLUoxetine  40 mg Oral Daily   heparin  5,000 Units Subcutaneous Q8H   levothyroxine  25 mcg Oral Q0600   metoprolol tartrate  50 mg Oral BID   QUEtiapine  750 mg Oral QHS   Continuous Infusions:  cefTRIAXone (ROCEPHIN)  IV 2 g (06/22/22 1826)    Nutritional status     Body mass index is 29.2 kg/m.  Data Reviewed:   CBC: Recent Labs  Lab 06/21/22 1404 06/21/22 2109 06/22/22 0015 06/23/22 0330  WBC 15.8* 14.4* 14.8* 11.1*  NEUTROABS 13.6*  --   --   --   HGB 11.0* 10.6* 11.2* 10.3*  HCT 32.9* 31.1* 32.6* 30.5*  MCV 99.4 97.8 97.0 97.1  PLT 201 198 197 [id number]   Basic Metabolic Panel: Recent Labs  Lab 06/21/22 1404 06/21/22 2109 06/22/22 0015 06/23/22 0330  NA 136  --  136 138  K 3.6  --  3.6 3.3*  CL 97*  --  98 100  CO2 25  --  25 26  GLUCOSE 117*  --  100* 96  BUN 18  --  18 13  CREATININE 1.99* 1.56* 1.41* 1.04  CALCIUM 8.4*  --  8.7* 8.4*  MG 1.7  --   --  2.0   GFR: Estimated Creatinine Clearance: 64.2 mL/min (by C-G formula based on SCr of 1.04 mg/dL). Liver Function Tests: Recent Labs  Lab 06/21/22 1404 06/22/22 0015  AST 17 21  ALT 14 14  ALKPHOS  61 63  BILITOT 1.0 0.8  PROT 6.4* 6.7  ALBUMIN 2.8* 2.9*   No results for input(s): "LIPASE", "AMYLASE" in the last 168 hours. No results for input(s): "AMMONIA" in the last 168 hours. Coagulation Profile: Recent Labs  Lab 06/21/22 1404  INR 1.3*   Cardiac Enzymes: No results for input(s): "CKTOTAL", "CKMB", "CKMBINDEX", "TROPONINI" in the last 168 hours. BNP (last 3 results) No results for input(s): "PROBNP" in the last 8760 hours. HbA1C: No results for input(s): "HGBA1C" in the last 72 hours. CBG: No results for input(s): "GLUCAP" in the last  168 hours. Lipid Profile: No results for input(s): "CHOL", "HDL", "LDLCALC", "TRIG", "CHOLHDL", "LDLDIRECT" in the last 72 hours. Thyroid Function Tests: Recent Labs    06/21/22 2109  TSH 0.261*   Anemia Panel: Recent Labs    06/21/22 2109  VITAMINB12 237  FOLATE 3.5*  FERRITIN 717*  TIBC 188*  IRON 14*  RETICCTPCT 0.9   Sepsis Labs: Recent Labs  Lab 06/21/22 1432 06/21/22 1559 06/21/22 2109  PROCALCITON  --   --  0.48  LATICACIDVEN 1.9 1.5  --     Recent Results (from the past 240 hour(s))  Blood Culture (routine x 2)     Status: None (Preliminary result)   Collection Time: 06/21/22  2:30 PM   Specimen: BLOOD LEFT HAND  Result Value Ref Range Status   Specimen Description BLOOD LEFT HAND  Final   Special Requests   Final    BOTTLES DRAWN AEROBIC ONLY Blood Culture results may not be optimal due to an inadequate volume of blood received in culture bottles   Culture   Final    NO GROWTH 2 DAYS Performed at [facility], [place] [address] [place], [place] [address]    Report Status PENDING  Incomplete  Blood Culture (routine x 2)     Status: None (Preliminary result)   Collection Time: 06/21/22  2:32 PM   Specimen: BLOOD  Result Value Ref Range Status   Specimen Description BLOOD RIGHT ANTECUBITAL  Final   Special Requests   Final    BOTTLES DRAWN AEROBIC AND ANAEROBIC Blood Culture results may not be  optimal due to an inadequate volume of blood received in culture bottles   Culture   Final    NO GROWTH 2 DAYS Performed at [facility], [place] [address], [place], [place] [address]    Report Status PENDING  Incomplete  Resp panel by RT-PCR (RSV, Flu A&B, Covid)     Status: None   Collection Time: 06/21/22  2:32 PM   Specimen: Nasal Swab  Result Value Ref Range Status   SARS Coronavirus 2 by RT PCR NEGATIVE NEGATIVE Final   Influenza A by PCR NEGATIVE NEGATIVE Final   Influenza B by PCR NEGATIVE NEGATIVE Final    Comment: (NOTE) The Xpert Xpress SARS-CoV-2/FLU/RSV plus assay is intended as an aid in the diagnosis of influenza from Nasopharyngeal swab specimens and should not be used as a sole basis for treatment. Nasal washings and aspirates are unacceptable for Xpert Xpress SARS-CoV-2/FLU/RSV testing.  Fact Sheet for Patients: EntrepreneurPulse.com.au  Fact Sheet for Healthcare Providers: IncredibleEmployment.be  This test is not yet approved or cleared by the [place] FDA and has been authorized for detection and/or diagnosis of SARS-CoV-2 by FDA under an Emergency Use Authorization (EUA). This EUA will remain in effect (meaning this test can be used) for the duration of the COVID-19 declaration under Section 564(b)(1) of the Act, 21 U.S.C. section 360bbb-3(b)(1), unless the authorization is terminated or revoked.     Resp Syncytial Virus by PCR NEGATIVE NEGATIVE Final    Comment: (NOTE) Fact Sheet for Patients: EntrepreneurPulse.com.au  Fact Sheet for Healthcare Providers: IncredibleEmployment.be  This test is not yet approved or cleared by the [place] FDA and has been authorized for detection and/or diagnosis of SARS-CoV-2 by FDA under an Emergency Use Authorization (EUA). This EUA will remain in effect (meaning this test can be used) for the duration of the COVID-19 declaration  under Section 564(b)(1) of the Act, 21 U.S.C. section  360bbb-3(b)(1), unless the authorization is terminated or revoked.  Performed at [facility], [place] [address], [place], [place] [address]   Urine Culture     Status: Abnormal   Collection Time: 06/21/22  3:59 PM   Specimen: Urine, Random  Result Value Ref Range Status   Specimen Description URINE, RANDOM  Final   Special Requests   Final    URINE, CLEAN CATCH Performed at [facility], [place] [address], [place], [place] [address]    Culture >=100,000 COLONIES/mL KLEBSIELLA PNEUMONIAE (A)  Final   Report Status 06/23/2022 FINAL  Final   Organism ID, Bacteria KLEBSIELLA PNEUMONIAE (A)  Final      Susceptibility   Klebsiella pneumoniae - MIC*    AMPICILLIN RESISTANT Resistant     CEFAZOLIN <=4 SENSITIVE Sensitive     CEFEPIME <=0.12 SENSITIVE Sensitive     CEFTRIAXONE <=0.25 SENSITIVE Sensitive     CIPROFLOXACIN <=0.25 SENSITIVE Sensitive     GENTAMICIN <=1 SENSITIVE Sensitive     IMIPENEM <=0.25 SENSITIVE Sensitive     NITROFURANTOIN 32 SENSITIVE Sensitive     TRIMETH/SULFA <=20 SENSITIVE Sensitive     AMPICILLIN/SULBACTAM 4 SENSITIVE Sensitive     PIP/TAZO <=4 SENSITIVE Sensitive     * >=100,000 COLONIES/mL KLEBSIELLA PNEUMONIAE  Urine Culture     Status: Abnormal   Collection Time: 06/21/22  9:09 PM   Specimen: Urine, Random  Result Value Ref Range Status   Specimen Description URINE, RANDOM  Final   Special Requests NONE Reflexed from [id number]  Final   Culture (A)  Final    <10,000 COLONIES/mL INSIGNIFICANT GROWTH Performed at [facility], [address], [place], [place] [address]    Report Status 06/22/2022 FINAL  Final  MRSA Next Gen by PCR, Nasal     Status: None   Collection Time: 06/21/22  9:51 PM   Specimen: Nasal Mucosa; Nasal Swab  Result Value Ref Range Status   MRSA by PCR Next Gen NOT DETECTED NOT DETECTED Final    Comment: (NOTE) The GeneXpert MRSA Assay (FDA approved for NASAL  specimens only), is one component of a comprehensive MRSA colonization surveillance program. It is not intended to diagnose MRSA infection nor to guide or monitor treatment for MRSA infections. Test performance is not FDA approved in patients less than 14 years old. Performed at [facility], [place] [address] [place], [place] [address]          Radiology Studies: CT CHEST ABDOMEN PELVIS WO CONTRAST  Result Date: 06/21/2022 CLINICAL DATA:  Sepsis EXAM: CT CHEST, ABDOMEN AND PELVIS WITHOUT CONTRAST TECHNIQUE: Multidetector CT imaging of the chest, abdomen and pelvis was performed following the standard protocol without IV contrast. RADIATION DOSE REDUCTION: This exam was performed according to the departmental dose-optimization program which includes automated exposure control, adjustment of the mA and/or kV according to patient size and/or use of iterative reconstruction technique. COMPARISON:  Chest radiography same day. CT 01/10/2021. CT 01/20/2017. FINDINGS: CT CHEST FINDINGS Cardiovascular: Heart size is normal. Coronary artery calcification and aortic atherosclerotic calcification are present. No pericardial effusion. Mediastinum/Nodes: No mass or lymphadenopathy. Lungs/Pleura: No pleural effusion. Scattered areas of pulmonary scarring. No sign of acute infectious pneumonia. No lobar collapse. No effusion. I do not believe that there is any pulmonary nodule requiring follow-up. Musculoskeletal: Unremarkable appearance of the spine, sternum and ribs. CT ABDOMEN PELVIS FINDINGS Hepatobiliary: Liver parenchyma is normal. No calcified gallstones. No CT evidence of gallbladder inflammation. Pancreas: Normal  Spleen: Normal Adrenals/Urinary Tract: Adrenal glands are normal. Kidneys are normal. No cyst, mass, stone or hydronephrosis. No evidence of renal infection. The bladder is normal. Stomach/Bowel: Stomach and small intestine are normal. Normal appendix. Fairly large amount of fecal matter in  the colon, but no evidence of acute colon pathology. No diverticulosis or diverticulitis. Vascular/Lymphatic: Aortic atherosclerosis. No aneurysm. IVC is normal. No adenopathy. Reproductive: Normal Other: No free fluid or air. Musculoskeletal: Ordinary mild lower lumbar degenerative changes. Previous ORIF of a right femur fracture. IMPRESSION: 1. No acute chest finding. Scattered areas of pulmonary scarring. No sign of acute infectious pneumonia. 2. No acute abdominal or pelvic finding. 3. Aortic atherosclerosis. Coronary artery calcification. 4. Fairly large amount of fecal matter in the colon, but no evidence of acute colon pathology. Aortic Atherosclerosis (ICD10-I70.0). Electronically Signed   By: Nelson Chimes M.D.   On: 06/21/2022 17:13   DG Chest Port 1 View  Result Date: 06/21/2022 CLINICAL DATA:  Sepsis, hypotension EXAM: PORTABLE CHEST 1 VIEW COMPARISON:  04/18/2022 FINDINGS: 2 frontal views of the chest are obtained. The cardiac silhouette is stable. Chronic elevation of the right hemidiaphragm. No airspace disease, effusion, or pneumothorax. IMPRESSION: 1. No acute intrathoracic process. Electronically Signed   By: Randa Ngo M.D.   On: 06/21/2022 14:37           LOS: 2 days   Time spent= 35 mins    Vadim Centola Arsenio Loader, MD [facility]  If 7PM-7AM, please contact night-coverage  06/23/2022, 10:41 AM

## 2022-06-23 NOTE — Plan of Care (Signed)

## 2022-06-24 DIAGNOSIS — A419 Sepsis, unspecified organism: Secondary | ICD-10-CM | POA: Diagnosis not present

## 2022-06-24 DIAGNOSIS — R6520 Severe sepsis without septic shock: Secondary | ICD-10-CM | POA: Diagnosis not present

## 2022-06-24 LAB — BASIC METABOLIC PANEL
Anion gap: 12 (ref 5–15)
BUN: 13 mg/dL (ref 8–23)
CO2: 25 mmol/L (ref 22–32)
Calcium: 8.6 mg/dL — ABNORMAL LOW (ref 8.9–10.3)
Chloride: 97 mmol/L — ABNORMAL LOW (ref 98–111)
Creatinine, Ser: 1.06 mg/dL (ref 0.61–1.24)
GFR, Estimated: >60 mL/min (ref >60)
Glucose, Bld: 106 mg/dL — ABNORMAL HIGH (ref 70–99)
Potassium: 3.8 mmol/L (ref 3.5–5.1)
Sodium: 134 mmol/L — ABNORMAL LOW (ref 135–145)

## 2022-06-24 LAB — GASTROINTESTINAL PANEL BY PCR, STOOL (REPLACES STOOL CULTURE)

## 2022-06-24 LAB — CBC
HCT: 35.2 % — ABNORMAL LOW (ref 39.0–52.0)
Hemoglobin: 12.2 g/dL — ABNORMAL LOW (ref 13.0–17.0)
MCH: 32.6 pg (ref 26.0–34.0)
MCHC: 34.7 g/dL (ref 30.0–36.0)
MCV: 94.1 fL (ref 80.0–100.0)
Platelets: 265 10*3/uL (ref 150–400)
RBC: 3.74 MIL/uL — ABNORMAL LOW (ref 4.22–5.81)
RDW: 12.5 % (ref 11.5–15.5)
WBC: 10.9 10*3/uL — ABNORMAL HIGH (ref 4.0–10.5)
nRBC: 0 % (ref 0.0–0.2)

## 2022-06-24 LAB — MAGNESIUM: Magnesium: 2 mg/dL (ref 1.7–2.4)

## 2022-06-24 MED ORDER — LISINOPRIL 10 MG PO TABS
5.0000 mg | ORAL_TABLET | Freq: Every day | ORAL | Status: DC
  Administered 2022-06-24 – 2022-06-28 (×5): 5 mg via ORAL
  Filled 2022-06-24 (×5): qty 1

## 2022-06-24 MED ORDER — PHENAZOPYRIDINE HCL 100 MG PO TABS
100.0000 mg | ORAL_TABLET | Freq: Three times a day (TID) | ORAL | Status: DC
  Administered 2022-06-24 – 2022-06-27 (×9): 100 mg via ORAL
  Filled 2022-06-24 (×10): qty 1

## 2022-06-24 NOTE — Plan of Care (Signed)

## 2022-06-24 NOTE — TOC Initial Note (Signed)
Transition of Care [facility]) - Initial/Assessment Note    Patient Details  Name: Derek Vargas MRN: [id number] Date of Birth: [birth year]  [facility]) CM/SW Contact:    Curlene Labrum, RN Phone Number: 06/24/2022, 8:55 AM  Clinical Narrative:                 CM met with the patient at the bedside to discuss TOC needs for SNF placement and the patient was agreeable to short-term rehabilitation.  No family was present in the room at this time.  PASRR screening was completed along with FL2 and patient was faxed out in the hub.  Expected Discharge Plan: Skilled Nursing Facility Barriers to Discharge: Continued Medical Work up   Patient Goals and CMS Choice Patient states their goals for this hospitalization and ongoing recovery are:: To get better CMS Medicare.gov Compare Post Acute Care list provided to:: Patient Choice offered to / list presented to : Patient [place] ownership interest in [facility].provided to:: Patient    Expected Discharge Plan and Services   Discharge Planning Services: CM Consult Post Acute Care Choice: [place] arrangements for the past 2 months: Apartment                                      Prior Living Arrangements/Services Living arrangements for the past 2 months: Apartment Lives with:: Spouse   Do you feel safe going back to the place where you live?: Yes      Need for Family Participation in Patient Care: Yes (Comment) Care giver support system in place?: Yes (comment) Current home services: DME Administrator, Civil Service, rolator, Radio producer) Criminal Activity/Legal Involvement Pertinent to Current Situation/Hospitalization: No - Comment as needed  Activities of Daily Living      Permission Sought/Granted Permission sought to share information with : Case Manager Permission granted to share information with : Yes, Verbal Permission Granted        Permission granted to share info w  Relationship: spouse - Derek Vargas     Emotional Assessment Appearance:: Appears stated age Attitude/Demeanor/Rapport: Gracious Affect (typically observed): Accepting Orientation: : Oriented to Self, Oriented to Place, Oriented to  Time, Oriented to Situation Alcohol / Substance Use: Not Applicable Psych Involvement: No (comment)  Admission diagnosis:  Generalized weakness [R53.1] AKI (acute kidney injury) [N17.9] Sepsis secondary to UTI [A41.9, N39.0] Patient Active Problem List   Diagnosis Date Noted   AKI (acute kidney injury) 06/22/2022   Iron deficiency anemia due to chronic blood loss 06/22/2022   Crohn disease 06/22/2022   ALS ruled out 06/22/2022   Mood disorder/history traumatic brain injury 06/22/2022   Sepsis with end organ damage due to UTI 06/21/2022   Sepsis 01/13/2021   Recurrent falls 01/11/2021   SIRS (systemic inflammatory response syndrome) 01/10/2021   Acute cystitis without hematuria 01/10/2021   Coronary artery disease involving native coronary artery of native heart without angina pectoris 01/10/2021   Hypothyroidism 01/10/2021   Mixed hyperlipidemia 01/10/2021   GERD without esophagitis 01/10/2021   Fall at home, initial encounter 01/10/2021   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 [place] [address] Phone: [phone] Fax: [phone]     Social Determinants of Health (SDOH) Social History: SDOH Screenings   Depression (PHQ2-9): Low Risk  (12/22/2018)  Tobacco Use: Medium Risk (06/21/2022)   SDOH Interventions:      Readmission Risk Interventions    06/24/2022    8:31 AM  Readmission Risk Prevention Plan  Transportation Screening Complete  PCP or Specialist Appt within 5-7 Days Complete  Home Care Screening Complete  Medication Review (RN CM) Complete

## 2022-06-24 NOTE — Progress Notes (Signed)
PROGRESS NOTE    Derek Vargas  [id number] DOB: [birth year] DOA: 06/21/2022 PCP: Venetia Maxon, Sharon Mt, MD   Brief Narrative:  72 year old with history of Crohn's disease, TBI, hypothyroidism, HTN, CAD status post remote stent comes to the hospital with fall, fevers.  Patient was found to have urinary tract infection.  His cultures eventually grew Klebsiella which was sensitive to IV Rocephin.  Hospital course complicated by development of diarrhea.  Eventually C. difficile was negative.  PT recommended SNF   Assessment & Plan:  Principal Problem:   Sepsis with end organ damage due to UTI Active Problems:   Coronary artery disease involving native coronary artery of native heart without angina pectoris   Hypothyroidism   Mixed hyperlipidemia   AKI (acute kidney injury)   Iron deficiency anemia due to chronic blood loss   Crohn disease   ALS ruled out   Mood disorder/history traumatic brain injury     Assessment and Plan: * Sepsis with end organ damage due to UTI, improving Sepsis physiology is improving.  Urine cultures growing Klebsiella.  Sensitivities reviewed.  Continue IV Rocephin.  Will plan for total of 7-day course.  CT abdomen pelvis is negative, no evidence of pyelonephritis at this time  Diarrhea, improving - Nonspecific.  Given previous history of Crohn's.  Abdomen is nontender nondistended.  Recent CT abdomen pelvis on 3/31 was negative for any acute pathology.  Discontinue rectal tube -C. difficile-negative.  GI panel-pending   Coronary artery disease involving native coronary artery of native heart without angina pectoris Currently remains chest pain-free.  Resume lisinopril.  Lasix on hold.  Will continue aspirin, Plavix and Lipitor  Hypothyroidism TSH low.  No symptoms of hyperthyroidism. - Continue levothyroxine -Repeat TSH, T3, and free T4 in 2 to 4 weeks  Essential hypertension, uncontrolled - Resume home lisinopril.  Continue metoprolol 50 mg  twice daily IV as needed  Mixed hyperlipidemia - Continue atorvastatin  Mood disorder/history traumatic brain injury - Continue home bupropion, fluoxetine, Seroquel  ALS ruled out Patient has a diagnosis of ALS and discharged.  He tells me this was made when he was 72 years old, however he he never had progression of symptoms, and it appears this was an mistaken diagnosis.  Crohn disease Does not appear to be on disease modifying therapy.  Follows with [facility].  Last appt in computer was 2016.  Iron deficiency anemia due to chronic blood loss Folic acid deficiency Mild anemia secondary to iron deficiency.  Will start supplements.  Will also place him on bowel regimen Folic acid supplements. Low normal vitamin B12, will also place him on vitamin B12 supplements These levels can be rechecked by PCP in next 6-8 weeks.  AKI (acute kidney injury), resolved CKD ruled out, baseline Cr 1.0 last Oct. Creatinine during this admission peaked at 1.99.  This morning gets close to baseline  PT recommended SNF.  TOC consulted  DVT prophylaxis: Subcu heparin Code Status: Full code Family Communication:  Called [facility] Status is: Inpatient Plans for SNF placement   Diet Orders (From admission, onward)     Start     Ordered   06/21/22 2017  Diet Heart Room service appropriate? Yes; Fluid consistency: Thin  Diet effective now       Question Answer Comment  Room service appropriate? Yes   Fluid consistency: Thin      06/21/22 2026            Subjective: Feels better, tells me his diarrhea is better.  No complaints. Slightly forgetful  Examination: Constitutional: Not in acute distress Respiratory: Clear to auscultation bilaterally Cardiovascular: Normal sinus rhythm, no rubs Abdomen: Nontender nondistended good bowel sounds Musculoskeletal: No edema noted Skin: No rashes seen Neurologic: CN 2-12 grossly intact.  And nonfocal Psychiatric: Normal judgment and insight. Alert and  oriented x 3. Normal mood.   Objective: Vitals:   06/23/22 1942 06/24/22 0344 06/24/22 0500 06/24/22 0748  BP: (!) 144/65 (!) 175/85  (!) 175/94  Pulse: 83 81  81  Resp: 17 18  18   Temp: 99 F (37.2 C) 98.8 F (37.1 C)  98.3 F (36.8 C)  TempSrc:    Oral  SpO2: 97% 96%  95%  Weight:   79.5 kg   Height:        Intake/Output Summary (Last 24 hours) at 06/24/2022 0801 Last data filed at 06/23/2022 1654 Gross per 24 hour  Intake 275 ml  Output 300 ml  Net -25 ml   Filed Weights   06/22/22 0500 06/23/22 0500 06/24/22 0500  Weight: 81 kg 79.6 kg 79.5 kg    Scheduled Meds:  aspirin EC  81 mg Oral Daily   atorvastatin  80 mg Oral QHS   buPROPion  150 mg Oral Daily   clopidogrel  75 mg Oral Daily   vitamin B-12  1,000 mcg Oral Daily   ferrous sulfate  325 mg Oral Q breakfast   FLUoxetine  40 mg Oral Daily   folic acid  1 mg Oral Daily   heparin  5,000 Units Subcutaneous Q8H   levothyroxine  25 mcg Oral Q0600   metoprolol tartrate  50 mg Oral BID   QUEtiapine  750 mg Oral QHS   Continuous Infusions:  cefTRIAXone (ROCEPHIN)  IV 2 g (06/23/22 1840)    Nutritional status     Body mass index is 29.17 kg/m.  Data Reviewed:   CBC: Recent Labs  Lab 06/21/22 1404 06/21/22 2109 06/22/22 0015 06/23/22 0330 06/24/22 0417  WBC 15.8* 14.4* 14.8* 11.1* 10.9*  NEUTROABS 13.6*  --   --   --   --   HGB 11.0* 10.6* 11.2* 10.3* 12.2*  HCT 32.9* 31.1* 32.6* 30.5* 35.2*  MCV 99.4 97.8 97.0 97.1 94.1  PLT 201 198 197 226 [id number]   Basic Metabolic Panel: Recent Labs  Lab 06/21/22 1404 06/21/22 2109 06/22/22 0015 06/23/22 0330 06/24/22 0417  NA 136  --  136 138 134*  K 3.6  --  3.6 3.3* 3.8  CL 97*  --  98 100 97*  CO2 25  --  25 26 25   GLUCOSE 117*  --  100* 96 106*  BUN 18  --  18 13 13   CREATININE 1.99* 1.56* 1.41* 1.04 1.06  CALCIUM 8.4*  --  8.7* 8.4* 8.6*  MG 1.7  --   --  2.0 2.0   GFR: Estimated Creatinine Clearance: 63 mL/min (by C-G formula based on SCr of  1.06 mg/dL). Liver Function Tests: Recent Labs  Lab 06/21/22 1404 06/22/22 0015  AST 17 21  ALT 14 14  ALKPHOS 61 63  BILITOT 1.0 0.8  PROT 6.4* 6.7  ALBUMIN 2.8* 2.9*   No results for input(s): "LIPASE", "AMYLASE" in the last 168 hours. No results for input(s): "AMMONIA" in the last 168 hours. Coagulation Profile: Recent Labs  Lab 06/21/22 1404  INR 1.3*   Cardiac Enzymes: No results for input(s): "CKTOTAL", "CKMB", "CKMBINDEX", "TROPONINI" in the last 168 hours. BNP (last 3 results) No results for  input(s): "PROBNP" in the last 8760 hours. HbA1C: No results for input(s): "HGBA1C" in the last 72 hours. CBG: No results for input(s): "GLUCAP" in the last 168 hours. Lipid Profile: No results for input(s): "CHOL", "HDL", "LDLCALC", "TRIG", "CHOLHDL", "LDLDIRECT" in the last 72 hours. Thyroid Function Tests: Recent Labs    06/21/22 2109  TSH 0.261*   Anemia Panel: Recent Labs    06/21/22 2109  VITAMINB12 237  FOLATE 3.5*  FERRITIN 717*  TIBC 188*  IRON 14*  RETICCTPCT 0.9   Sepsis Labs: Recent Labs  Lab 06/21/22 1432 06/21/22 1559 06/21/22 2109  PROCALCITON  --   --  0.48  LATICACIDVEN 1.9 1.5  --     Recent Results (from the past 240 hour(s))  Blood Culture (routine x 2)     Status: None (Preliminary result)   Collection Time: 06/21/22  2:30 PM   Specimen: BLOOD LEFT HAND  Result Value Ref Range Status   Specimen Description BLOOD LEFT HAND  Final   Special Requests   Final    BOTTLES DRAWN AEROBIC ONLY Blood Culture results may not be optimal due to an inadequate volume of blood received in culture bottles   Culture   Final    NO GROWTH 3 DAYS Performed at [facility], [place] [address], [place], [place] [address]    Report Status PENDING  Incomplete  Blood Culture (routine x 2)     Status: None (Preliminary result)   Collection Time: 06/21/22  2:32 PM   Specimen: BLOOD  Result Value Ref Range Status   Specimen Description BLOOD RIGHT  ANTECUBITAL  Final   Special Requests   Final    BOTTLES DRAWN AEROBIC AND ANAEROBIC Blood Culture results may not be optimal due to an inadequate volume of blood received in culture bottles   Culture   Final    NO GROWTH 3 DAYS Performed at [facility], [place] [address], [place], [place] [address]    Report Status PENDING  Incomplete  Resp panel by RT-PCR (RSV, Flu A&B, Covid)     Status: None   Collection Time: 06/21/22  2:32 PM   Specimen: Nasal Swab  Result Value Ref Range Status   SARS Coronavirus 2 by RT PCR NEGATIVE NEGATIVE Final   Influenza A by PCR NEGATIVE NEGATIVE Final   Influenza B by PCR NEGATIVE NEGATIVE Final    Comment: (NOTE) The Xpert Xpress SARS-CoV-2/FLU/RSV plus assay is intended as an aid in the diagnosis of influenza from Nasopharyngeal swab specimens and should not be used as a sole basis for treatment. Nasal washings and aspirates are unacceptable for Xpert Xpress SARS-CoV-2/FLU/RSV testing.  Fact Sheet for Patients: EntrepreneurPulse.com.au  Fact Sheet for Healthcare Providers: IncredibleEmployment.be  This test is not yet approved or cleared by the [place] FDA and has been authorized for detection and/or diagnosis of SARS-CoV-2 by FDA under an Emergency Use Authorization (EUA). This EUA will remain in effect (meaning this test can be used) for the duration of the COVID-19 declaration under Section 564(b)(1) of the Act, 21 U.S.C. section 360bbb-3(b)(1), unless the authorization is terminated or revoked.     Resp Syncytial Virus by PCR NEGATIVE NEGATIVE Final    Comment: (NOTE) Fact Sheet for Patients: EntrepreneurPulse.com.au  Fact Sheet for Healthcare Providers: IncredibleEmployment.be  This test is not yet approved or cleared by the [place] FDA and has been authorized for detection and/or diagnosis of SARS-CoV-2 by FDA under an Emergency Use Authorization  (EUA). This  EUA will remain in effect (meaning this test can be used) for the duration of the COVID-19 declaration under Section 564(b)(1) of the Act, 21 U.S.C. section 360bbb-3(b)(1), unless the authorization is terminated or revoked.  Performed at [facility], [place] [address] [place], [place] [address]   Urine Culture     Status: Abnormal   Collection Time: 06/21/22  3:59 PM   Specimen: Urine, Random  Result Value Ref Range Status   Specimen Description URINE, RANDOM  Final   Special Requests   Final    URINE, CLEAN CATCH Performed at [facility], [place] [address], [place], [place] [address]    Culture >=100,000 COLONIES/mL KLEBSIELLA PNEUMONIAE (A)  Final   Report Status 06/23/2022 FINAL  Final   Organism ID, Bacteria KLEBSIELLA PNEUMONIAE (A)  Final      Susceptibility   Klebsiella pneumoniae - MIC*    AMPICILLIN RESISTANT Resistant     CEFAZOLIN <=4 SENSITIVE Sensitive     CEFEPIME <=0.12 SENSITIVE Sensitive     CEFTRIAXONE <=0.25 SENSITIVE Sensitive     CIPROFLOXACIN <=0.25 SENSITIVE Sensitive     GENTAMICIN <=1 SENSITIVE Sensitive     IMIPENEM <=0.25 SENSITIVE Sensitive     NITROFURANTOIN 32 SENSITIVE Sensitive     TRIMETH/SULFA <=20 SENSITIVE Sensitive     AMPICILLIN/SULBACTAM 4 SENSITIVE Sensitive     PIP/TAZO <=4 SENSITIVE Sensitive     * >=100,000 COLONIES/mL KLEBSIELLA PNEUMONIAE  Urine Culture     Status: Abnormal   Collection Time: 06/21/22  9:09 PM   Specimen: Urine, Random  Result Value Ref Range Status   Specimen Description URINE, RANDOM  Final   Special Requests NONE Reflexed from [id number]  Final   Culture (A)  Final    <10,000 COLONIES/mL INSIGNIFICANT GROWTH Performed at [facility], [address] [place], [place] [address]    Report Status 06/22/2022 FINAL  Final  MRSA Next Gen by PCR, Nasal     Status: None   Collection Time: 06/21/22  9:51 PM   Specimen: Nasal Mucosa; Nasal Swab  Result Value Ref Range Status   MRSA by PCR  Next Gen NOT DETECTED NOT DETECTED Final    Comment: (NOTE) The GeneXpert MRSA Assay (FDA approved for NASAL specimens only), is one component of a comprehensive MRSA colonization surveillance program. It is not intended to diagnose MRSA infection nor to guide or monitor treatment for MRSA infections. Test performance is not FDA approved in patients less than 73 years old. Performed at [facility], [place] [address] [place], [place] [address]   C Difficile Quick Screen w PCR reflex     Status: None   Collection Time: 06/23/22  1:16 PM   Specimen: STOOL  Result Value Ref Range Status   C Diff antigen NEGATIVE NEGATIVE Final   C Diff toxin NEGATIVE NEGATIVE Final   C Diff interpretation No C. difficile detected.  Final    Comment: Performed at [facility], [place] [address], [place], [place] [address]         Radiology Studies: No results found.         LOS: 3 days   Time spent= 35 mins    Tyshon Fanning Arsenio Loader, MD [facility]  If 7PM-7AM, please contact night-coverage  06/24/2022, 8:01 AM

## 2022-06-24 NOTE — NC FL2 (Signed)
[place] LEVEL OF CARE FORM     IDENTIFICATION  Patient Name: Derek Vargas Birthdate: 1951-03-16 Sex: male Admission Date (Current Location): 06/21/2022  [facility] and [place] Number:  Herbalist and Address:  [facility], [place] [address] [place], [place] [address]      Provider Number: [id number]  Attending Physician Name and Address:  Damita Lack, MD  Relative Name and Phone Number:  Derek Vargas -    Current Level of Care: Hospital Recommended Level of Care: [place] Prior Approval Number:    Date Approved/Denied:   PASRR Number: [id number]  Discharge Plan: SNF    Current Diagnoses: Patient Active Problem List   Diagnosis Date Noted   AKI (acute kidney injury) 06/22/2022   Iron deficiency anemia due to chronic blood loss 06/22/2022   Crohn disease 06/22/2022   ALS ruled out 06/22/2022   Mood disorder/history traumatic brain injury 06/22/2022   Sepsis with end organ damage due to UTI 06/21/2022   Sepsis 01/13/2021   Recurrent falls 01/11/2021   SIRS (systemic inflammatory response syndrome) 01/10/2021   Acute cystitis without hematuria 01/10/2021   Coronary artery disease involving native coronary artery of native heart without angina pectoris 01/10/2021   Hypothyroidism 01/10/2021   Mixed hyperlipidemia 01/10/2021   GERD without esophagitis 01/10/2021   Fall at home, initial encounter 01/10/2021   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    Orientation RESPIRATION BLADDER Height & Weight     Self, Time, Situation, Place  Normal External catheter Weight: 79.5 kg Height:  5\' 5"  (165.1 cm)  BEHAVIORAL SYMPTOMS/MOOD NEUROLOGICAL BOWEL NUTRITION STATUS      Incontinent Diet  AMBULATORY STATUS COMMUNICATION OF NEEDS Skin   Total Care Verbally Normal                       Personal Care Assistance Level of Assistance  Bathing, Feeding, Dressing, Total care Bathing Assistance: Limited  assistance Feeding assistance: Independent Dressing Assistance: Maximum assistance Total Care Assistance: Maximum assistance   Functional Limitations Info  Sight, Hearing, Speech Sight Info: Adequate Hearing Info: Adequate Speech Info: Adequate    SPECIAL CARE FACTORS FREQUENCY  PT (By licensed PT), OT (By licensed OT)     PT Frequency: 3-5 x per week OT Frequency: 3-5 x per week            Contractures Contractures Info: Not present    Additional Factors Info  Code Status, Allergies, Psychotropic Code Status Info: Full code Allergies Info: codeine Psychotropic Info: Wellbutrin, Prozac, Seroquel, Trazodone         Current Medications (06/24/2022):  This is the current hospital active medication list Current Facility-Administered Medications  Medication Dose Route Frequency Provider Last Rate Last Admin   acetaminophen (TYLENOL) tablet 650 mg  650 mg Oral Q6H PRN Clance Boll, MD   650 mg at 06/22/22 0355   Or   acetaminophen (TYLENOL) suppository 650 mg  650 mg Rectal Q6H PRN Clance Boll, MD       alum & mag hydroxide-simeth (MAALOX/MYLANTA) 200-200-20 MG/5ML suspension 30 mL  30 mL Oral Q4H PRN Amin, Ankit Chirag, MD       aspirin EC tablet 81 mg  81 mg Oral Daily Edwin Dada, MD   81 mg at 06/23/22 0937   atorvastatin (LIPITOR) tablet 80 mg  80 mg Oral QHS Edwin Dada, MD   80 mg at 06/23/22 2026   buPROPion (  WELLBUTRIN XL) 24 hr tablet 150 mg  150 mg Oral Daily Edwin Dada, MD   150 mg at 06/23/22 0936   cefTRIAXone (ROCEPHIN) 2 g in sodium chloride 0.9 % 100 mL IVPB  2 g Intravenous Q24H Myles Rosenthal A, MD 200 mL/hr at 06/23/22 1840 2 g at 06/23/22 1840   clopidogrel (PLAVIX) tablet 75 mg  75 mg Oral Daily Edwin Dada, MD   75 mg at 06/23/22 [id number]   cyanocobalamin (VITAMIN B12) tablet 1,000 mcg  1,000 mcg Oral Daily Damita Lack, MD   1,000 mcg at 06/23/22 1240   docusate sodium (COLACE) capsule 100  mg  100 mg Oral BID PRN Clance Boll, MD       ferrous sulfate tablet 325 mg  325 mg Oral Q breakfast Amin, Ankit Chirag, MD       FLUoxetine (PROZAC) capsule 40 mg  40 mg Oral Daily Edwin Dada, MD   40 mg at [id number]   folic acid (FOLVITE) tablet 1 mg  1 mg Oral Daily Amin, Ankit Chirag, MD   1 mg at 06/23/22 1240   guaiFENesin (ROBITUSSIN) 100 MG/5ML liquid 5 mL  5 mL Oral Q4H PRN Amin, Jeanella Flattery, MD       heparin injection 5,000 Units  5,000 Units Subcutaneous Q8H Clance Boll, MD   5,000 Units at 06/24/22 [id number]   HYDROcodone-acetaminophen (NORCO/VICODIN) 5-325 MG per tablet 1 tablet  1 tablet Oral Q6H PRN Edwin Dada, MD   1 tablet at 06/23/22 1027   hydrocortisone (ANUSOL-HC) 2.5 % rectal cream 1 Application  1 Application Topical QID PRN Amin, Ankit Chirag, MD       hydrocortisone cream 1 % 1 Application  1 Application Topical TID PRN Amin, Ankit Chirag, MD       ipratropium-albuterol (DUONEB) 0.5-2.5 (3) MG/3ML nebulizer solution 3 mL  3 mL Nebulization Q4H PRN Amin, Jeanella Flattery, MD       levothyroxine (SYNTHROID) tablet 25 mcg  25 mcg Oral Q0600 Edwin Dada, MD   25 mcg at 06/24/22 [id number]   lip balm (CARMEX) ointment 1 Application  1 Application Topical PRN Amin, Ankit Chirag, MD       lisinopril (ZESTRIL) tablet 5 mg  5 mg Oral Daily Amin, Ankit Chirag, MD       loratadine (CLARITIN) tablet 10 mg  10 mg Oral Daily PRN Amin, Ankit Chirag, MD       metoprolol tartrate (LOPRESSOR) tablet 50 mg  50 mg Oral BID Edwin Dada, MD   50 mg at 06/23/22 2025   Muscle Rub CREA 1 Application  1 Application Topical PRN Amin, Ankit Chirag, MD       ondansetron (ZOFRAN) injection 4 mg  4 mg Intravenous Q6H PRN Amin, Ankit Chirag, MD       ondansetron (ZOFRAN) tablet 4 mg  4 mg Oral Q6H PRN Myles Rosenthal A, MD       phenol (CHLORASEPTIC) mouth spray 1 spray  1 spray Mouth/Throat PRN Amin, Ankit Chirag, MD       polyvinyl alcohol  (LIQUIFILM TEARS) 1.4 % ophthalmic solution 1 drop  1 drop Both Eyes PRN Amin, Ankit Chirag, MD       QUEtiapine (SEROQUEL) tablet 750 mg  750 mg Oral QHS Danford, Suann Larry, MD   750 mg at 06/23/22 2022   senna-docusate (Senokot-S) tablet 1 tablet  1 tablet Oral QHS PRN Damita Lack, MD  sodium chloride (OCEAN) 0.65 % nasal spray 1 spray  1 spray Each Nare PRN Amin, Ankit Chirag, MD       traZODone (DESYREL) tablet 50 mg  50 mg Oral QHS PRN Amin, Jeanella Flattery, MD         Discharge Medications: Please see discharge summary for a list of discharge medications.  Relevant Imaging Results:  Relevant Lab Results:   Additional Information SS# [id number]  Curlene Labrum, RN

## 2022-06-24 NOTE — Progress Notes (Signed)
Physical Therapy Treatment Patient Details Name: Derek Vargas MRN: [id number] DOB: [birth year] Today's Date: 06/24/2022   History of Present Illness 72 y.o. presented to ED via EMS with fall, fever, found to have UTI sepsis. PMH: hypertension, hyperlipidemia, coronary artery disease (Hx STEMI with BMS to LAD 2010), anxiety disorder, hypothyroidism, essential tremor,  gastroesophageal reflux disease, Alzheimer's dementia, ALS, Crohn's disease, stroke (10/2018 S/P TPA)    PT Comments    Pt is being seen for gait and strengthening, cut a bit short by the arrival of his food from family visit.  Pt is demonstrating very laterally unsafe gait but is at times min assist for control of balance.  This bodes well to get home with rehab stay, and family is in to visit and observe his mobility.  Follow acutely with mobility as tolerated and continue to work on high level balance and strengthening as tolerated.   Recommendations for follow up therapy are one component of a multi-disciplinary discharge planning process, led by the attending physician.  Recommendations may be updated based on patient status, additional functional criteria and insurance authorization.  Follow Up Recommendations  Can patient physically be transported by private vehicle: No    Assistance Recommended at Discharge Frequent or constant Supervision/Assistance  Patient can return home with the following Two people to help with walking and/or transfers;Two people to help with bathing/dressing/bathroom;Assistance with cooking/housework;Direct supervision/assist for medications management;Direct supervision/assist for financial management;Assist for transportation;Help with stairs or ramp for entrance   Equipment Recommendations  None recommended by PT    Recommendations for Other Services OT consult     Precautions / Restrictions Precautions Precautions: Fall Precaution Comments: many falls, enteric  precautions Restrictions Weight Bearing Restrictions: No     Mobility  Bed Mobility               General bed mobility comments: up to chair when PT arrives    Transfers Overall transfer level: Needs assistance Equipment used: Rolling walker (2 wheels) Transfers: Sit to/from Stand Sit to Stand: Min guard           General transfer comment: min guard with pt using correct hand placemetn    Ambulation/Gait Ambulation/Gait assistance: Min assist, Mod assist Gait Distance (Feet): 45 Feet Assistive device: Rolling walker (2 wheels) Gait Pattern/deviations: Step-through pattern, Decreased stride length, Wide base of support Gait velocity: reduced Gait velocity interpretation: <1.31 ft/sec, indicative of household ambulator Pre-gait activities: standing balance ck General Gait Details: Pt has strong sudden lurches to the R side, and was able to control with RW and assist but unsafe alone   Stairs             Wheelchair Mobility    Modified Rankin (Stroke Patients Only)       Balance Overall balance assessment: Needs assistance Sitting-balance support: Feet supported Sitting balance-Leahy Scale: Good     Standing balance support: Bilateral upper extremity supported, During functional activity Standing balance-Leahy Scale: Fair Standing balance comment: less than fair dynamically                            Cognition Arousal/Alertness: Awake/alert Behavior During Therapy: Flat affect Overall Cognitive Status: History of cognitive impairments - at baseline                                 General Comments: pt is understanding at a basic level  Exercises General Exercises - Lower Extremity Ankle Circles/Pumps: AROM Long Arc Quad: Strengthening, 10 reps Heel Slides: Strengthening, 10 reps    General Comments General comments (skin integrity, edema, etc.): pt is up to walk with RW and fatigued by the first walk, but  is motivated to try.  Has contact/enteric precautions and cannot leave room but is too tired to try today      Pertinent Vitals/Pain Pain Assessment Pain Assessment: Faces Faces Pain Scale: No hurt    Home Living                          Prior Function            PT Goals (current goals can now be found in the care plan section) Acute Rehab PT Goals Patient Stated Goal: doesn't want to miss his lunch time Progress towards PT goals: Progressing toward goals    Frequency    Min 3X/week      PT Plan Current plan remains appropriate    Co-evaluation              AM-PAC PT "6 Clicks" Mobility   Outcome Measure  Help needed turning from your back to your side while in a flat bed without using bedrails?: A Little Help needed moving from lying on your back to sitting on the side of a flat bed without using bedrails?: A Little Help needed moving to and from a bed to a chair (including a wheelchair)?: A Lot Help needed standing up from a chair using your arms (e.g., wheelchair or bedside chair)?: A Lot Help needed to walk in hospital room?: A Lot Help needed climbing 3-5 steps with a railing? : Total 6 Click Score: 13    End of Session Equipment Utilized During Treatment: Gait belt Activity Tolerance: Patient tolerated treatment well Patient left: in chair;with call bell/phone within reach;with chair alarm set;with family/visitor present Nurse Communication: Mobility status PT Visit Diagnosis: Unsteadiness on feet (R26.81);Other abnormalities of gait and mobility (R26.89);Repeated falls (R29.6);Muscle weakness (generalized) (M62.81);History of falling (Z91.81);Difficulty in walking, not elsewhere classified (R26.2);Dizziness and giddiness (R42)     Time: [id number] PT Time Calculation (min) (ACUTE ONLY): 17 min  Charges:  $Gait Training: 8-22 mins    Ramond Dial 06/24/2022, 3:10 PM  Mee Hives, PT PhD Acute Rehab Dept. Number: [place] and [place]

## 2022-06-24 NOTE — TOC Progression Note (Signed)
Transition of Care [facility]) - Progression Note    Patient Details  Name: Derek Vargas MRN: [id number] Date of Birth: 1950-04-12  Transition of Care [facility]) CM/SW Contact  Jinger Neighbors, [place] Phone Number: 06/24/2022, 12:46 PM  Clinical Narrative:    CSW presented bed offers to pt at bedside, along with his spouse. Pt prefers to go to [facility]. CSW messaged [facility] for [facility] and [facility] was approved. CSW messaged attending to inquire about medical stability for dc.    Expected Discharge Plan: [place] Barriers to Discharge: Continued Medical Work up  Expected Discharge Plan and Services   Discharge Planning Services: CM Consult Post Acute Care Choice: [place] Living arrangements for the past 2 months: Apartment                                       Social Determinants of Health (SDOH) Interventions SDOH Screenings   Depression (PHQ2-9): Low Risk  (12/22/2018)  Tobacco Use: Medium Risk (06/21/2022)    Readmission Risk Interventions    06/24/2022    8:31 AM  Readmission Risk Prevention Plan  Transportation Screening Complete  PCP or Specialist Appt within 5-7 Days Complete  Home Care Screening Complete  Medication Review (RN CM) Complete

## 2022-06-25 DIAGNOSIS — R6520 Severe sepsis without septic shock: Secondary | ICD-10-CM | POA: Diagnosis not present

## 2022-06-25 DIAGNOSIS — A419 Sepsis, unspecified organism: Secondary | ICD-10-CM | POA: Diagnosis not present

## 2022-06-25 LAB — BASIC METABOLIC PANEL
Anion gap: 13 (ref 5–15)
BUN: 15 mg/dL (ref 8–23)
CO2: 27 mmol/L (ref 22–32)
Calcium: 8.6 mg/dL — ABNORMAL LOW (ref 8.9–10.3)
Chloride: 95 mmol/L — ABNORMAL LOW (ref 98–111)
Creatinine, Ser: 1.27 mg/dL — ABNORMAL HIGH (ref 0.61–1.24)
GFR, Estimated: >60 mL/min (ref >60)
Glucose, Bld: 96 mg/dL (ref 70–99)
Potassium: 3.7 mmol/L (ref 3.5–5.1)
Sodium: 135 mmol/L (ref 135–145)

## 2022-06-25 LAB — CBC
HCT: 37.3 % — ABNORMAL LOW (ref 39.0–52.0)
Hemoglobin: 13.2 g/dL (ref 13.0–17.0)
MCH: 32.8 pg (ref 26.0–34.0)
MCHC: 35.4 g/dL (ref 30.0–36.0)
MCV: 92.6 fL (ref 80.0–100.0)
Platelets: 295 10*3/uL (ref 150–400)
RBC: 4.03 MIL/uL — ABNORMAL LOW (ref 4.22–5.81)
RDW: 12.6 % (ref 11.5–15.5)
WBC: 10.4 10*3/uL (ref 4.0–10.5)
nRBC: 0 % (ref 0.0–0.2)

## 2022-06-25 LAB — MAGNESIUM: Magnesium: 2.1 mg/dL (ref 1.7–2.4)

## 2022-06-25 NOTE — Plan of Care (Signed)

## 2022-06-25 NOTE — Plan of Care (Signed)
A/Ox4 and on room air. No complaints of pain this shift. On enteric precautions for norovirus. Slept for most of the night after bedtime meds given. No bm overnight.    Problem: Education: Goal: Knowledge of General Education information will improve Description: Including pain rating scale, medication(s)/side effects and non-pharmacologic comfort measures Outcome: Progressing   Problem: Health Behavior/Discharge Planning: Goal: Ability to manage health-related needs will improve Outcome: Progressing   Problem: Clinical Measurements: Goal: Ability to maintain clinical measurements within normal limits will improve Outcome: Progressing Goal: Will remain free from infection Outcome: Progressing Goal: Diagnostic test results will improve Outcome: Progressing Goal: Respiratory complications will improve Outcome: Progressing Goal: Cardiovascular complication will be avoided Outcome: Progressing   Problem: Activity: Goal: Risk for activity intolerance will decrease Outcome: Progressing   Problem: Nutrition: Goal: Adequate nutrition will be maintained Outcome: Progressing   Problem: Coping: Goal: Level of anxiety will decrease Outcome: Progressing   Problem: Elimination: Goal: Will not experience complications related to bowel motility Outcome: Progressing Goal: Will not experience complications related to urinary retention Outcome: Progressing   Problem: Pain Managment: Goal: General experience of comfort will improve Outcome: Progressing   Problem: Safety: Goal: Ability to remain free from injury will improve Outcome: Progressing   Problem: Skin Integrity: Goal: Risk for impaired skin integrity will decrease Outcome: Progressing

## 2022-06-25 NOTE — Progress Notes (Signed)
PROGRESS NOTE    Derek Vargas  [id number] DOB: Aug 10, 1950 DOA: 06/21/2022 PCP: Venetia Maxon, [place], MD   Brief Narrative:  72 year old with history of Crohn's disease, TBI, hypothyroidism, HTN, CAD status post remote stent comes to the hospital with fall, fevers.  Patient was found to have urinary tract infection.  His cultures eventually grew Klebsiella which was sensitive to IV Rocephin.  Hospital course complicated by development of diarrhea.  Eventually C. difficile was negative.  GI panel was positive for norovirus.  PT recommended SNF   Assessment & Plan:  Principal Problem:   Sepsis with end organ damage due to UTI Active Problems:   Coronary artery disease involving native coronary artery of native heart without angina pectoris   Hypothyroidism   Mixed hyperlipidemia   AKI (acute kidney injury)   Iron deficiency anemia due to chronic blood loss   Crohn disease   ALS ruled out   Mood disorder/history traumatic brain injury     Assessment and Plan: * Sepsis with end organ damage due to UTI, improving Sepsis physiology is improving.  Urine cultures growing Klebsiella.  Sensitivities reviewed.  Continue IV Rocephin.  Will plan for total of 7-day course.  CT abdomen pelvis is negative, no evidence of pyelonephritis at this time  Diarrhea, secondary to norovirus - Nonspecific.  Given previous history of Crohn's.  CT abdomen pelvis does not show any acute gastroenteritis.  Diarrhea seems to have subsided.  C. difficile is negative.  GI panel was positive for norovirus. Appears to have cleared out the infection   Coronary artery disease involving native coronary artery of native heart without angina pectoris Currently remains chest pain-free.  Resume lisinopril.  Lasix on hold.  Will continue aspirin, Plavix and Lipitor  Hypothyroidism TSH low.  No symptoms of hyperthyroidism. - Continue levothyroxine -Repeat TSH, T3, and free T4 in 2 to 4 weeks  Essential  hypertension, uncontrolled - Resume home lisinopril.  Continue metoprolol 50 mg twice daily IV as needed  Mixed hyperlipidemia - Continue atorvastatin  Mood disorder/history traumatic brain injury - Continue home bupropion, fluoxetine, Seroquel  ALS ruled out Patient has a diagnosis of ALS and discharged.  He tells me this was made when he was 72 years old, however he he never had progression of symptoms, and it appears this was an mistaken diagnosis.  Crohn disease Does not appear to be on disease modifying therapy.  Follows with [facility].  Last appt in computer was 2016.  Iron deficiency anemia due to chronic blood loss Folic acid deficiency Mild anemia secondary to iron deficiency.  Will start supplements.  Will also place him on bowel regimen Folic acid supplements. Low normal vitamin B12, will also place him on vitamin B12 supplements These levels can be rechecked by PCP in next 6-8 weeks.  AKI (acute kidney injury), resolved CKD ruled out, baseline Cr 1.0 last Oct. Creatinine during this admission peaked at 1.99.  This morning gets close to baseline  PT recommended SNF.  TOC consulted  DVT prophylaxis: Subcu heparin Code Status: Full code Family Communication:  Called [facility] Status is: Inpatient Plans for SNF placement but patient will have to wait at least 5 days after clearing norovirus.  He is no longer having diarrhea therefore would assume that he is cleared out infection today   Diet Orders (From admission, onward)     Start     Ordered   06/21/22 2017  Diet Heart Room service appropriate? Yes; Fluid consistency: Thin  Diet effective  now       Question Answer Comment  Room service appropriate? Yes   Fluid consistency: Thin      06/21/22 2026            Subjective: Diarrhea appears to have subsided Denies any abdominal pain Examination: Constitutional: Not in acute distress Respiratory: Clear to auscultation bilaterally Cardiovascular: Normal sinus  rhythm, no rubs Abdomen: Nontender nondistended good bowel sounds Musculoskeletal: No edema noted Skin: No rashes seen Neurologic: CN 2-12 grossly intact.  And nonfocal Psychiatric: Normal judgment and insight. Alert and oriented x 3. Normal mood. Objective: Vitals:   06/24/22 2000 06/24/22 2126 06/25/22 0404 06/25/22 0726  BP:   105/64 (!) 113/54  Pulse: 76 80 76 86  Resp:   15   Temp:   97.6 F (36.4 C) 97.9 F (36.6 C)  TempSrc:   Oral Oral  SpO2:    98%  Weight:      Height:        Intake/Output Summary (Last 24 hours) at 06/25/2022 1035 Last data filed at 06/25/2022 0239 Gross per 24 hour  Intake --  Output 1425 ml  Net -1425 ml   Filed Weights   06/22/22 0500 06/23/22 0500 06/24/22 0500  Weight: 81 kg 79.6 kg 79.5 kg    Scheduled Meds:  aspirin EC  81 mg Oral Daily   atorvastatin  80 mg Oral QHS   buPROPion  150 mg Oral Daily   clopidogrel  75 mg Oral Daily   vitamin B-12  1,000 mcg Oral Daily   ferrous sulfate  325 mg Oral Q breakfast   FLUoxetine  40 mg Oral Daily   folic acid  1 mg Oral Daily   heparin  5,000 Units Subcutaneous Q8H   levothyroxine  25 mcg Oral Q0600   lisinopril  5 mg Oral Daily   metoprolol tartrate  50 mg Oral BID   phenazopyridine  100 mg Oral TID WC   QUEtiapine  750 mg Oral QHS   Continuous Infusions:  cefTRIAXone (ROCEPHIN)  IV 2 g (06/24/22 1735)    Nutritional status     Body mass index is 29.17 kg/m.  Data Reviewed:   CBC: Recent Labs  Lab 06/21/22 1404 06/21/22 2109 06/22/22 0015 06/23/22 0330 06/24/22 0417 06/25/22 0441  WBC 15.8* 14.4* 14.8* 11.1* 10.9* 10.4  NEUTROABS 13.6*  --   --   --   --   --   HGB 11.0* 10.6* 11.2* 10.3* 12.2* 13.2  HCT 32.9* 31.1* 32.6* 30.5* 35.2* 37.3*  MCV 99.4 97.8 97.0 97.1 94.1 92.6  PLT 201 198 197 226 265 [id number]   Basic Metabolic Panel: Recent Labs  Lab 06/21/22 1404 06/21/22 2109 06/22/22 0015 06/23/22 0330 06/24/22 0417 06/25/22 0441  NA 136  --  136 138 134* 135  K  3.6  --  3.6 3.3* 3.8 3.7  CL 97*  --  98 100 97* 95*  CO2 25  --  25 26 25 27   GLUCOSE 117*  --  100* 96 106* 96  BUN 18  --  18 13 13 15   CREATININE 1.99* 1.56* 1.41* 1.04 1.06 1.27*  CALCIUM 8.4*  --  8.7* 8.4* 8.6* 8.6*  MG 1.7  --   --  2.0 2.0 2.1   GFR: Estimated Creatinine Clearance: 52.6 mL/min (A) (by C-G formula based on SCr of 1.27 mg/dL (H)). Liver Function Tests: Recent Labs  Lab 06/21/22 1404 06/22/22 0015  AST 17 21  ALT 14 14  ALKPHOS 61 63  BILITOT 1.0 0.8  PROT 6.4* 6.7  ALBUMIN 2.8* 2.9*   No results for input(s): "LIPASE", "AMYLASE" in the last 168 hours. No results for input(s): "AMMONIA" in the last 168 hours. Coagulation Profile: Recent Labs  Lab 06/21/22 1404  INR 1.3*   Cardiac Enzymes: No results for input(s): "CKTOTAL", "CKMB", "CKMBINDEX", "TROPONINI" in the last 168 hours. BNP (last 3 results) No results for input(s): "PROBNP" in the last 8760 hours. HbA1C: No results for input(s): "HGBA1C" in the last 72 hours. CBG: No results for input(s): "GLUCAP" in the last 168 hours. Lipid Profile: No results for input(s): "CHOL", "HDL", "LDLCALC", "TRIG", "CHOLHDL", "LDLDIRECT" in the last 72 hours. Thyroid Function Tests: No results for input(s): "TSH", "T4TOTAL", "FREET4", "T3FREE", "THYROIDAB" in the last 72 hours.  Anemia Panel: No results for input(s): "VITAMINB12", "FOLATE", "FERRITIN", "TIBC", "IRON", "RETICCTPCT" in the last 72 hours.  Sepsis Labs: Recent Labs  Lab 06/21/22 1432 06/21/22 1559 06/21/22 2109  PROCALCITON  --   --  0.48  LATICACIDVEN 1.9 1.5  --     Recent Results (from the past 240 hour(s))  Blood Culture (routine x 2)     Status: None (Preliminary result)   Collection Time: 06/21/22  2:30 PM   Specimen: BLOOD LEFT HAND  Result Value Ref Range Status   Specimen Description BLOOD LEFT HAND  Final   Special Requests   Final    BOTTLES DRAWN AEROBIC ONLY Blood Culture results may not be optimal due to an  inadequate volume of blood received in culture bottles   Culture   Final    NO GROWTH 4 DAYS Performed at [facility], [place] [address], [place], [place] [address]    Report Status PENDING  Incomplete  Blood Culture (routine x 2)     Status: None (Preliminary result)   Collection Time: 06/21/22  2:32 PM   Specimen: BLOOD  Result Value Ref Range Status   Specimen Description BLOOD RIGHT ANTECUBITAL  Final   Special Requests   Final    BOTTLES DRAWN AEROBIC AND ANAEROBIC Blood Culture results may not be optimal due to an inadequate volume of blood received in culture bottles   Culture   Final    NO GROWTH 4 DAYS Performed at [facility], [place] [address] [place], [place] [address]    Report Status PENDING  Incomplete  Resp panel by RT-PCR (RSV, Flu A&B, Covid)     Status: None   Collection Time: 06/21/22  2:32 PM   Specimen: Nasal Swab  Result Value Ref Range Status   SARS Coronavirus 2 by RT PCR NEGATIVE NEGATIVE Final   Influenza A by PCR NEGATIVE NEGATIVE Final   Influenza B by PCR NEGATIVE NEGATIVE Final    Comment: (NOTE) The Xpert Xpress SARS-CoV-2/FLU/RSV plus assay is intended as an aid in the diagnosis of influenza from Nasopharyngeal swab specimens and should not be used as a sole basis for treatment. Nasal washings and aspirates are unacceptable for Xpert Xpress SARS-CoV-2/FLU/RSV testing.  Fact Sheet for Patients: EntrepreneurPulse.com.au  Fact Sheet for Healthcare Providers: IncredibleEmployment.be  This test is not yet approved or cleared by the [place] FDA and has been authorized for detection and/or diagnosis of SARS-CoV-2 by FDA under an Emergency Use Authorization (EUA). This EUA will remain in effect (meaning this test can be used) for the duration of the COVID-19 declaration under Section 564(b)(1) of the Act, 21 U.S.C. section 360bbb-3(b)(1), unless the authorization is terminated  or revoked.      Resp Syncytial Virus by PCR NEGATIVE NEGATIVE Final    Comment: (NOTE) Fact Sheet for Patients: EntrepreneurPulse.com.au  Fact Sheet for Healthcare Providers: IncredibleEmployment.be  This test is not yet approved or cleared by the [place] FDA and has been authorized for detection and/or diagnosis of SARS-CoV-2 by FDA under an Emergency Use Authorization (EUA). This EUA will remain in effect (meaning this test can be used) for the duration of the COVID-19 declaration under Section 564(b)(1) of the Act, 21 U.S.C. section 360bbb-3(b)(1), unless the authorization is terminated or revoked.  Performed at [facility], [place] [address], [place], [place] [address]   Urine Culture     Status: Abnormal   Collection Time: 06/21/22  3:59 PM   Specimen: Urine, Random  Result Value Ref Range Status   Specimen Description URINE, RANDOM  Final   Special Requests   Final    URINE, CLEAN CATCH Performed at [facility], [place] [address], [place], [place] [address]    Culture >=100,000 COLONIES/mL KLEBSIELLA PNEUMONIAE (A)  Final   Report Status 06/23/2022 FINAL  Final   Organism ID, Bacteria KLEBSIELLA PNEUMONIAE (A)  Final      Susceptibility   Klebsiella pneumoniae - MIC*    AMPICILLIN RESISTANT Resistant     CEFAZOLIN <=4 SENSITIVE Sensitive     CEFEPIME <=0.12 SENSITIVE Sensitive     CEFTRIAXONE <=0.25 SENSITIVE Sensitive     CIPROFLOXACIN <=0.25 SENSITIVE Sensitive     GENTAMICIN <=1 SENSITIVE Sensitive     IMIPENEM <=0.25 SENSITIVE Sensitive     NITROFURANTOIN 32 SENSITIVE Sensitive     TRIMETH/SULFA <=20 SENSITIVE Sensitive     AMPICILLIN/SULBACTAM 4 SENSITIVE Sensitive     PIP/TAZO <=4 SENSITIVE Sensitive     * >=100,000 COLONIES/mL KLEBSIELLA PNEUMONIAE  Urine Culture     Status: Abnormal   Collection Time: 06/21/22  9:09 PM   Specimen: Urine, Random  Result Value Ref Range Status   Specimen Description URINE, RANDOM  Final    Special Requests NONE Reflexed from [id number]  Final   Culture (A)  Final    <10,000 COLONIES/mL INSIGNIFICANT GROWTH Performed at [facility], [address], [place], [place] [address]    Report Status 06/22/2022 FINAL  Final  MRSA Next Gen by PCR, Nasal     Status: None   Collection Time: 06/21/22  9:51 PM   Specimen: Nasal Mucosa; Nasal Swab  Result Value Ref Range Status   MRSA by PCR Next Gen NOT DETECTED NOT DETECTED Final    Comment: (NOTE) The GeneXpert MRSA Assay (FDA approved for NASAL specimens only), is one component of a comprehensive MRSA colonization surveillance program. It is not intended to diagnose MRSA infection nor to guide or monitor treatment for MRSA infections. Test performance is not FDA approved in patients less than 38 years old. Performed at [facility], [place] [address] [place], [place] [address]   C Difficile Quick Screen w PCR reflex     Status: None   Collection Time: 06/23/22  1:16 PM   Specimen: STOOL  Result Value Ref Range Status   C Diff antigen NEGATIVE NEGATIVE Final   C Diff toxin NEGATIVE NEGATIVE Final   C Diff interpretation No C. difficile detected.  Final    Comment: Performed at [facility], [place] [address], [place], [place] [address]  Gastrointestinal Panel by PCR , Stool     Status: Abnormal  Collection Time: 06/23/22  1:16 PM   Specimen: STOOL  Result Value Ref Range Status   Campylobacter species NOT DETECTED NOT DETECTED Final   Plesimonas shigelloides NOT DETECTED NOT DETECTED Final   Salmonella species NOT DETECTED NOT DETECTED Final   Yersinia enterocolitica NOT DETECTED NOT DETECTED Final   Vibrio species NOT DETECTED NOT DETECTED Final   Vibrio cholerae NOT DETECTED NOT DETECTED Final   Enteroaggregative E coli (EAEC) NOT DETECTED NOT DETECTED Final   Enteropathogenic E coli (EPEC) NOT DETECTED NOT DETECTED Final   Enterotoxigenic E coli (ETEC) NOT DETECTED NOT DETECTED Final   Shiga like toxin  producing E coli (STEC) NOT DETECTED NOT DETECTED Final   Shigella/Enteroinvasive E coli (EIEC) NOT DETECTED NOT DETECTED Final   Cryptosporidium NOT DETECTED NOT DETECTED Final   Cyclospora cayetanensis NOT DETECTED NOT DETECTED Final   Entamoeba histolytica NOT DETECTED NOT DETECTED Final   Giardia lamblia NOT DETECTED NOT DETECTED Final   Adenovirus F40/41 NOT DETECTED NOT DETECTED Final   Astrovirus NOT DETECTED NOT DETECTED Final   Norovirus GI/GII DETECTED (A) NOT DETECTED Final    Comment: RESULT CALLED TO, READ BACK BY AND VERIFIED WITH: HELAINE GUILLAUME AT 1250 ON 06/24/22 BY SS    Rotavirus A NOT DETECTED NOT DETECTED Final   Sapovirus (I, II, IV, and V) NOT DETECTED NOT DETECTED Final    Comment: Performed at [facility], [address] [place], [place] [address]         Radiology Studies: No results found.         LOS: 4 days   Time spent= 35 mins    Jontae Sonier Arsenio Loader, MD [facility]  If 7PM-7AM, please contact night-coverage  06/25/2022, 10:35 AM

## 2022-06-25 NOTE — TOC Progression Note (Signed)
Transition of Care [facility]) - Progression Note    Patient Details  Name: Derek Vargas MRN: [id number] Date of Birth: [birth year]  Transition of Care [facility]) CM/SW Contact  Jinger Neighbors, [place] Phone Number: 06/25/2022, 1:32 PM  Clinical Narrative:     CSW completed pt's med transport form; however, CSW was notified by the nurse, pt has norovirus. CSW made [place] [facility] [employer] aware and inquired about their policy. Lexine Baton stated pt can admit to SNF 5 days after being asymptomatic. CSW updated MD and RN. CSW met with pt at bedside to make him aware. While CSW was in the room, pt's son called and CSW relayed the information to the son.   Expected Discharge Plan: [place] Barriers to Discharge: No Barriers Identified  Expected Discharge Plan and Services   Discharge Planning Services: CM Consult Post Acute Care Choice: [place] Living arrangements for the past 2 months: Apartment                                       Social Determinants of Health (SDOH) Interventions SDOH Screenings   Depression (PHQ2-9): Low Risk  (12/22/2018)  Tobacco Use: Medium Risk (06/21/2022)    Readmission Risk Interventions    06/24/2022    8:31 AM  Readmission Risk Prevention Plan  Transportation Screening Complete  PCP or Specialist Appt within 5-7 Days Complete  Home Care Screening Complete  Medication Review (RN CM) Complete

## 2022-06-26 DIAGNOSIS — A419 Sepsis, unspecified organism: Secondary | ICD-10-CM | POA: Diagnosis not present

## 2022-06-26 DIAGNOSIS — R6520 Severe sepsis without septic shock: Secondary | ICD-10-CM | POA: Diagnosis not present

## 2022-06-26 LAB — BASIC METABOLIC PANEL
Anion gap: 13 (ref 5–15)
BUN: 20 mg/dL (ref 8–23)
CO2: 26 mmol/L (ref 22–32)
Calcium: 8.5 mg/dL — ABNORMAL LOW (ref 8.9–10.3)
Chloride: 96 mmol/L — ABNORMAL LOW (ref 98–111)
Creatinine, Ser: 1.1 mg/dL (ref 0.61–1.24)
GFR, Estimated: >60 mL/min (ref >60)
Glucose, Bld: 92 mg/dL (ref 70–99)
Potassium: 3.3 mmol/L — ABNORMAL LOW (ref 3.5–5.1)
Sodium: 135 mmol/L (ref 135–145)

## 2022-06-26 LAB — CBC
HCT: 35.9 % — ABNORMAL LOW (ref 39.0–52.0)
Hemoglobin: 12.5 g/dL — ABNORMAL LOW (ref 13.0–17.0)
MCH: 32.4 pg (ref 26.0–34.0)
MCHC: 34.8 g/dL (ref 30.0–36.0)
MCV: 93 fL (ref 80.0–100.0)
Platelets: 340 10*3/uL (ref 150–400)
RBC: 3.86 MIL/uL — ABNORMAL LOW (ref 4.22–5.81)
RDW: 12.9 % (ref 11.5–15.5)
WBC: 8 10*3/uL (ref 4.0–10.5)
nRBC: 0 % (ref 0.0–0.2)

## 2022-06-26 LAB — CULTURE, BLOOD (ROUTINE X 2)
Culture: NO GROWTH
Culture: NO GROWTH

## 2022-06-26 LAB — MAGNESIUM: Magnesium: 2.3 mg/dL (ref 1.7–2.4)

## 2022-06-26 MED ORDER — POTASSIUM CHLORIDE CRYS ER 20 MEQ PO TBCR
40.0000 meq | EXTENDED_RELEASE_TABLET | Freq: Once | ORAL | Status: AC
  Administered 2022-06-26: 40 meq via ORAL
  Filled 2022-06-26: qty 2

## 2022-06-26 NOTE — Plan of Care (Signed)

## 2022-06-26 NOTE — Progress Notes (Signed)
PROGRESS NOTE    Derek Vargas  [id number] DOB: 1950/10/20 DOA: 06/21/2022 PCP: Casper HarrisonStreet, Stephanie Couphristopher M, MD   Brief Narrative:  72 year old with history of Crohn's disease, TBI, hypothyroidism, HTN, CAD status post remote stent comes to the hospital with fall, fevers.  Patient was found to have urinary tract infection.  His cultures eventually grew Klebsiella which was sensitive to IV Rocephin.  Hospital course complicated by development of diarrhea.  Eventually C. difficile was negative.  GI panel was positive for norovirus.  PT recommended SNF.  SNF wants him to wait at least 5 days after his diarrhea has resolved.  Earliest we can admit is 4/8   Assessment & Plan:  Principal Problem:   Sepsis with end organ damage due to UTI Active Problems:   Coronary artery disease involving native coronary artery of native heart without angina pectoris   Hypothyroidism   Mixed hyperlipidemia   AKI (acute kidney injury)   Iron deficiency anemia due to chronic blood loss   Crohn disease   ALS ruled out   Mood disorder/history traumatic brain injury     Assessment and Plan: * Sepsis with end organ damage due to UTI, improving Sepsis physiology is improving.  Urine cultures growing Klebsiella.  Sensitivities reviewed.  Continue IV Rocephin.  Will plan for total of 7-day course.  Last day 4/7. CT abdomen pelvis is negative, no evidence of pyelonephritis at this time  Diarrhea, secondary to norovirus - Nonspecific.  Given previous history of Crohn's.  CT abdomen pelvis does not show any acute gastroenteritis.  Diarrhea seems to have subsided.  C. difficile is negative.  GI panel was positive for norovirus. Diarrhea has resolved.   Coronary artery disease involving native coronary artery of native heart without angina pectoris Currently remains chest pain-free.  Resume lisinopril.  Lasix on hold.  Will continue aspirin, Plavix and Lipitor  Hypothyroidism TSH low.  No symptoms of  hyperthyroidism. - Continue levothyroxine -Repeat TSH, T3, and free T4 in 2 to 4 weeks  Essential hypertension, uncontrolled - Resume home lisinopril.  Continue metoprolol 50 mg twice daily IV as needed  Mixed hyperlipidemia - Continue atorvastatin  Mood disorder/history traumatic brain injury - Continue home bupropion, fluoxetine, Seroquel  ALS ruled out Patient has a diagnosis of ALS and discharged.  He tells me this was made when he was 72 years old, however he he never had progression of symptoms, and it appears this was an mistaken diagnosis.  Crohn disease Does not appear to be on disease modifying therapy.  Follows with [facility].  Last appt in computer was 2016.  Iron deficiency anemia due to chronic blood loss Folic acid deficiency Mild anemia secondary to iron deficiency.  Will start supplements.  Will also place him on bowel regimen Folic acid supplements. Low normal vitamin B12, will also place him on vitamin B12 supplements These levels can be rechecked by PCP in next 6-8 weeks.  AKI (acute kidney injury), resolved CKD ruled out, baseline Cr 1.0 last Oct. Creatinine during this admission peaked at 1.99.  This morning gets close to baseline  PT recommended SNF.  TOC consulted  DVT prophylaxis: Subcu heparin Code Status: Full code Family Communication:  Called Tresa EndoKelly Status is: Inpatient Plans for SNF placement but patient will have to wait at least 5 days after clearing norovirus.  He is no longer having diarrhea therefore would assume that he is cleared out infection. Earliest they can admit is 4/8   Diet Orders (From admission, onward)  Start     Ordered   06/21/22 2017  Diet Heart Room service appropriate? Yes; Fluid consistency: Thin  Diet effective now       Question Answer Comment  Room service appropriate? Yes   Fluid consistency: Thin      06/21/22 2026            Subjective: Doing well no complaints.  Denies any diarrhea.  Has some bloating  but passing good amount of gas  Examination: Constitutional: Not in acute distress Respiratory: Clear to auscultation bilaterally Cardiovascular: Normal sinus rhythm, no rubs Abdomen: Nontender nondistended good bowel sounds Musculoskeletal: No edema noted Skin: No rashes seen Neurologic: CN 2-12 grossly intact.  And nonfocal Psychiatric: Normal judgment and insight. Alert and oriented x 3. Normal mood. Objective: Vitals:   06/25/22 0726 06/25/22 1511 06/25/22 2035 06/26/22 0536  BP: (!) 113/54 115/74 (!) 142/78 129/78  Pulse: 86 74 81 84  Resp:   18 16  Temp: 97.9 F (36.6 C) 98.3 F (36.8 C) 98.2 F (36.8 C) 97.7 F (36.5 C)  TempSrc: Oral Oral Oral Oral  SpO2: 98% 98% 97% 98%  Weight:      Height:        Intake/Output Summary (Last 24 hours) at 06/26/2022 0727 Last data filed at 06/25/2022 1710 Gross per 24 hour  Intake --  Output 300 ml  Net -300 ml   Filed Weights   06/22/22 0500 06/23/22 0500 06/24/22 0500  Weight: 81 kg 79.6 kg 79.5 kg    Scheduled Meds:  aspirin EC  81 mg Oral Daily   atorvastatin  80 mg Oral QHS   buPROPion  150 mg Oral Daily   clopidogrel  75 mg Oral Daily   vitamin B-12  1,000 mcg Oral Daily   ferrous sulfate  325 mg Oral Q breakfast   FLUoxetine  40 mg Oral Daily   folic acid  1 mg Oral Daily   heparin  5,000 Units Subcutaneous Q8H   levothyroxine  25 mcg Oral Q0600   lisinopril  5 mg Oral Daily   metoprolol tartrate  50 mg Oral BID   phenazopyridine  100 mg Oral TID WC   QUEtiapine  750 mg Oral QHS   Continuous Infusions:  cefTRIAXone (ROCEPHIN)  IV 2 g (06/25/22 1718)    Nutritional status     Body mass index is 29.17 kg/m.  Data Reviewed:   CBC: Recent Labs  Lab 06/21/22 1404 06/21/22 2109 06/22/22 0015 06/23/22 0330 06/24/22 0417 06/25/22 0441  WBC 15.8* 14.4* 14.8* 11.1* 10.9* 10.4  NEUTROABS 13.6*  --   --   --   --   --   HGB 11.0* 10.6* 11.2* 10.3* 12.2* 13.2  HCT 32.9* 31.1* 32.6* 30.5* 35.2* 37.3*  MCV  99.4 97.8 97.0 97.1 94.1 92.6  PLT 201 198 197 226 265 295   Basic Metabolic Panel: Recent Labs  Lab 06/21/22 1404 06/21/22 2109 06/22/22 0015 06/23/22 0330 06/24/22 0417 06/25/22 0441  NA 136  --  136 138 134* 135  K 3.6  --  3.6 3.3* 3.8 3.7  CL 97*  --  98 100 97* 95*  CO2 25  --  25 26 25 27   GLUCOSE 117*  --  100* 96 106* 96  BUN 18  --  18 13 13 15   CREATININE 1.99* 1.56* 1.41* 1.04 1.06 1.27*  CALCIUM 8.4*  --  8.7* 8.4* 8.6* 8.6*  MG 1.7  --   --  2.0  2.0 2.1   GFR: Estimated Creatinine Clearance: 52.6 mL/min (A) (by C-G formula based on SCr of 1.27 mg/dL (H)). Liver Function Tests: Recent Labs  Lab 06/21/22 1404 06/22/22 0015  AST 17 21  ALT 14 14  ALKPHOS 61 63  BILITOT 1.0 0.8  PROT 6.4* 6.7  ALBUMIN 2.8* 2.9*   No results for input(s): "LIPASE", "AMYLASE" in the last 168 hours. No results for input(s): "AMMONIA" in the last 168 hours. Coagulation Profile: Recent Labs  Lab 06/21/22 1404  INR 1.3*   Cardiac Enzymes: No results for input(s): "CKTOTAL", "CKMB", "CKMBINDEX", "TROPONINI" in the last 168 hours. BNP (last 3 results) No results for input(s): "PROBNP" in the last 8760 hours. HbA1C: No results for input(s): "HGBA1C" in the last 72 hours. CBG: No results for input(s): "GLUCAP" in the last 168 hours. Lipid Profile: No results for input(s): "CHOL", "HDL", "LDLCALC", "TRIG", "CHOLHDL", "LDLDIRECT" in the last 72 hours. Thyroid Function Tests: No results for input(s): "TSH", "T4TOTAL", "FREET4", "T3FREE", "THYROIDAB" in the last 72 hours.  Anemia Panel: No results for input(s): "VITAMINB12", "FOLATE", "FERRITIN", "TIBC", "IRON", "RETICCTPCT" in the last 72 hours.  Sepsis Labs: Recent Labs  Lab 06/21/22 1432 06/21/22 1559 06/21/22 2109  PROCALCITON  --   --  0.48  LATICACIDVEN 1.9 1.5  --     Recent Results (from the past 240 hour(s))  Blood Culture (routine x 2)     Status: None (Preliminary result)   Collection Time: 06/21/22  2:30  PM   Specimen: BLOOD LEFT HAND  Result Value Ref Range Status   Specimen Description BLOOD LEFT HAND  Final   Special Requests   Final    BOTTLES DRAWN AEROBIC ONLY Blood Culture results may not be optimal due to an inadequate volume of blood received in culture bottles   Culture   Final    NO GROWTH 4 DAYS Performed at [facility], [address], [place], [place] [address]    Report Status PENDING  Incomplete  Blood Culture (routine x 2)     Status: None (Preliminary result)   Collection Time: 06/21/22  2:32 PM   Specimen: BLOOD  Result Value Ref Range Status   Specimen Description BLOOD RIGHT ANTECUBITAL  Final   Special Requests   Final    BOTTLES DRAWN AEROBIC AND ANAEROBIC Blood Culture results may not be optimal due to an inadequate volume of blood received in culture bottles   Culture   Final    NO GROWTH 4 DAYS Performed at [facility], [address] [place], [place] [address]    Report Status PENDING  Incomplete  Resp panel by RT-PCR (RSV, Flu A&B, Covid)     Status: None   Collection Time: 06/21/22  2:32 PM   Specimen: Nasal Swab  Result Value Ref Range Status   SARS Coronavirus 2 by RT PCR NEGATIVE NEGATIVE Final   Influenza A by PCR NEGATIVE NEGATIVE Final   Influenza B by PCR NEGATIVE NEGATIVE Final    Comment: (NOTE) The Xpert Xpress SARS-CoV-2/FLU/RSV plus assay is intended as an aid in the diagnosis of influenza from Nasopharyngeal swab specimens and should not be used as a sole basis for treatment. Nasal washings and aspirates are unacceptable for Xpert Xpress SARS-CoV-2/FLU/RSV testing.  Fact Sheet for Patients: BloggerCourse.com  Fact Sheet for Healthcare Providers: SeriousBroker.it  This test is not yet approved or cleared by the [place] FDA and has been authorized for detection and/or diagnosis of SARS-CoV-2 by  FDA under an Emergency Use Authorization (EUA). This EUA will  remain in effect (meaning this test can be used) for the duration of the COVID-19 declaration under Section 564(b)(1) of the Act, 21 U.S.C. section 360bbb-3(b)(1), unless the authorization is terminated or revoked.     Resp Syncytial Virus by PCR NEGATIVE NEGATIVE Final    Comment: (NOTE) Fact Sheet for Patients: BloggerCourse.com  Fact Sheet for Healthcare Providers: SeriousBroker.it  This test is not yet approved or cleared by the [place] FDA and has been authorized for detection and/or diagnosis of SARS-CoV-2 by FDA under an Emergency Use Authorization (EUA). This EUA will remain in effect (meaning this test can be used) for the duration of the COVID-19 declaration under Section 564(b)(1) of the Act, 21 U.S.C. section 360bbb-3(b)(1), unless the authorization is terminated or revoked.  Performed at [facility], [address] [place], [place] [address]   Urine Culture     Status: Abnormal   Collection Time: 06/21/22  3:59 PM   Specimen: Urine, Random  Result Value Ref Range Status   Specimen Description URINE, RANDOM  Final   Special Requests   Final    URINE, CLEAN CATCH Performed at [facility], [address], [place], [place] [address]    Culture >=100,000 COLONIES/mL KLEBSIELLA PNEUMONIAE (A)  Final   Report Status 06/23/2022 FINAL  Final   Organism ID, Bacteria KLEBSIELLA PNEUMONIAE (A)  Final      Susceptibility   Klebsiella pneumoniae - MIC*    AMPICILLIN RESISTANT Resistant     CEFAZOLIN <=4 SENSITIVE Sensitive     CEFEPIME <=0.12 SENSITIVE Sensitive     CEFTRIAXONE <=0.25 SENSITIVE Sensitive     CIPROFLOXACIN <=0.25 SENSITIVE Sensitive     GENTAMICIN <=1 SENSITIVE Sensitive     IMIPENEM <=0.25 SENSITIVE Sensitive     NITROFURANTOIN 32 SENSITIVE Sensitive     TRIMETH/SULFA <=20 SENSITIVE Sensitive     AMPICILLIN/SULBACTAM 4 SENSITIVE Sensitive     PIP/TAZO <=4 SENSITIVE Sensitive     *  >=100,000 COLONIES/mL KLEBSIELLA PNEUMONIAE  Urine Culture     Status: Abnormal   Collection Time: 06/21/22  9:09 PM   Specimen: Urine, Random  Result Value Ref Range Status   Specimen Description URINE, RANDOM  Final   Special Requests NONE Reflexed from [id number]  Final   Culture (A)  Final    <10,000 COLONIES/mL INSIGNIFICANT GROWTH Performed at [facility], [address], [place], [place] [address]    Report Status 06/22/2022 FINAL  Final  MRSA Next Gen by PCR, Nasal     Status: None   Collection Time: 06/21/22  9:51 PM   Specimen: Nasal Mucosa; Nasal Swab  Result Value Ref Range Status   MRSA by PCR Next Gen NOT DETECTED NOT DETECTED Final    Comment: (NOTE) The GeneXpert MRSA Assay (FDA approved for NASAL specimens only), is one component of a comprehensive MRSA colonization surveillance program. It is not intended to diagnose MRSA infection nor to guide or monitor treatment for MRSA infections. Test performance is not FDA approved in patients less than 51 years old. Performed at [facility], [address], [place], [place] [address]   C Difficile Quick Screen w PCR reflex     Status: None   Collection Time: 06/23/22  1:16 PM   Specimen: STOOL  Result Value Ref Range Status   C Diff antigen NEGATIVE NEGATIVE Final   C Diff toxin NEGATIVE NEGATIVE Final  C Diff interpretation No C. difficile detected.  Final    Comment: Performed at [facility], [address], [place], [place] [address]  Gastrointestinal Panel by PCR , Stool     Status: Abnormal   Collection Time: 06/23/22  1:16 PM   Specimen: STOOL  Result Value Ref Range Status   Campylobacter species NOT DETECTED NOT DETECTED Final   Plesimonas shigelloides NOT DETECTED NOT DETECTED Final   Salmonella species NOT DETECTED NOT DETECTED Final   Yersinia enterocolitica NOT DETECTED NOT DETECTED Final   Vibrio species NOT DETECTED NOT DETECTED Final   Vibrio cholerae NOT DETECTED NOT DETECTED Final    Enteroaggregative E coli (EAEC) NOT DETECTED NOT DETECTED Final   Enteropathogenic E coli (EPEC) NOT DETECTED NOT DETECTED Final   Enterotoxigenic E coli (ETEC) NOT DETECTED NOT DETECTED Final   Shiga like toxin producing E coli (STEC) NOT DETECTED NOT DETECTED Final   Shigella/Enteroinvasive E coli (EIEC) NOT DETECTED NOT DETECTED Final   Cryptosporidium NOT DETECTED NOT DETECTED Final   Cyclospora cayetanensis NOT DETECTED NOT DETECTED Final   Entamoeba histolytica NOT DETECTED NOT DETECTED Final   Giardia lamblia NOT DETECTED NOT DETECTED Final   Adenovirus F40/41 NOT DETECTED NOT DETECTED Final   Astrovirus NOT DETECTED NOT DETECTED Final   Norovirus GI/GII DETECTED (A) NOT DETECTED Final    Comment: RESULT CALLED TO, READ BACK BY AND VERIFIED WITH: HELAINE GUILLAUME AT 1250 ON 06/24/22 BY SS    Rotavirus A NOT DETECTED NOT DETECTED Final   Sapovirus (I, II, IV, and V) NOT DETECTED NOT DETECTED Final    Comment: Performed at [facility], [address] [place], [place] [address]         Radiology Studies: No results found.         LOS: 5 days   Time spent= 35 mins    Zayla Agar Joline Maxcy, MD [facility]  If 7PM-7AM, please contact night-coverage  06/26/2022, 7:27 AM

## 2022-06-26 NOTE — Plan of Care (Addendum)
A/Ox4 and on room air. No complaints of pain this shift. No diarrhea overnight. Slept all night after med pass, with exception of staff waking him up. Took meds as prescribed. No overnight issues.   Problem: Education: Goal: Knowledge of General Education information will improve Description: Including pain rating scale, medication(s)/side effects and non-pharmacologic comfort measures Outcome: Progressing   Problem: Health Behavior/Discharge Planning: Goal: Ability to manage health-related needs will improve Outcome: Progressing   Problem: Clinical Measurements: Goal: Ability to maintain clinical measurements within normal limits will improve Outcome: Progressing Goal: Will remain free from infection Outcome: Progressing Goal: Diagnostic test results will improve Outcome: Progressing Goal: Respiratory complications will improve Outcome: Progressing Goal: Cardiovascular complication will be avoided Outcome: Progressing   Problem: Activity: Goal: Risk for activity intolerance will decrease Outcome: Progressing   Problem: Nutrition: Goal: Adequate nutrition will be maintained Outcome: Progressing   Problem: Coping: Goal: Level of anxiety will decrease Outcome: Progressing   Problem: Elimination: Goal: Will not experience complications related to bowel motility Outcome: Progressing Goal: Will not experience complications related to urinary retention Outcome: Progressing   Problem: Pain Managment: Goal: General experience of comfort will improve Outcome: Progressing   Problem: Safety: Goal: Ability to remain free from injury will improve Outcome: Progressing   Problem: Skin Integrity: Goal: Risk for impaired skin integrity will decrease Outcome: Progressing

## 2022-06-26 NOTE — Progress Notes (Signed)
PT Cancellation Note  Patient Details Name: Derek Vargas MRN: [id number] DOB: Jan 10, 1951   Cancelled Treatment:    Reason Eval/Treat Not Completed: Other (comment) (Pt refused as he is on the bedpan and has been off and on all day.)   Bevelyn Buckles 06/26/2022, 3:39 PM Merlean Pizzini M,PT Acute Rehab Services [phone]

## 2022-06-27 DIAGNOSIS — A419 Sepsis, unspecified organism: Secondary | ICD-10-CM | POA: Diagnosis not present

## 2022-06-27 DIAGNOSIS — R6520 Severe sepsis without septic shock: Secondary | ICD-10-CM | POA: Diagnosis not present

## 2022-06-27 LAB — CBC
HCT: 36 % — ABNORMAL LOW (ref 39.0–52.0)
Hemoglobin: 12.5 g/dL — ABNORMAL LOW (ref 13.0–17.0)
MCH: 32.9 pg (ref 26.0–34.0)
MCHC: 34.7 g/dL (ref 30.0–36.0)
MCV: 94.7 fL (ref 80.0–100.0)
Platelets: 343 10*3/uL (ref 150–400)
RBC: 3.8 MIL/uL — ABNORMAL LOW (ref 4.22–5.81)
RDW: 12.7 % (ref 11.5–15.5)
WBC: 7.8 10*3/uL (ref 4.0–10.5)
nRBC: 0 % (ref 0.0–0.2)

## 2022-06-27 LAB — BASIC METABOLIC PANEL
Anion gap: 12 (ref 5–15)
BUN: 18 mg/dL (ref 8–23)
CO2: 25 mmol/L (ref 22–32)
Calcium: 8.2 mg/dL — ABNORMAL LOW (ref 8.9–10.3)
Chloride: 99 mmol/L (ref 98–111)
Creatinine, Ser: 1.11 mg/dL (ref 0.61–1.24)
GFR, Estimated: >60 mL/min (ref >60)
Glucose, Bld: 92 mg/dL (ref 70–99)
Potassium: 3.9 mmol/L (ref 3.5–5.1)
Sodium: 136 mmol/L (ref 135–145)

## 2022-06-27 LAB — MAGNESIUM: Magnesium: 2.2 mg/dL (ref 1.7–2.4)

## 2022-06-27 NOTE — Progress Notes (Signed)
PROGRESS NOTE    Derek Vargas  ZOX:[id number] DOB: 11-Jul-1950 DOA: 06/21/2022 PCP: Casper Harrison, Stephanie Coup, MD   Brief Narrative:  72 year old with history of Crohn's disease, TBI, hypothyroidism, HTN, CAD status post remote stent comes to the hospital with fall, fevers.  Patient was found to have urinary tract infection.  His cultures eventually grew Klebsiella which was sensitive to IV Rocephin.  Hospital course complicated by development of diarrhea.  Eventually C. difficile was negative.  GI panel was positive for norovirus.  PT recommended SNF.  SNF wants him to wait at least 5 days after his diarrhea has resolved.  Earliest we can admit is 4/8  Assessment and Plan: * Sepsis with end organ damage due to UTI, improving Sepsis physiology is improving.  Urine cultures growing Klebsiella.  Sensitivities reviewed.  Continue IV Rocephin.  Will plan for total of 7-day course.  Last day 4/7. CT abdomen pelvis is negative, no evidence of pyelonephritis at this time  Diarrhea, secondary to norovirus Nonspecific.  Given previous history of Crohn's.  CT abdomen pelvis does not show any acute gastroenteritis.  Diarrhea seems to have subsided.  C. difficile is negative.  GI panel was positive for norovirus. Diarrhea has resolved.  Coronary artery disease involving native coronary artery of native heart without angina pectoris Currently remains chest pain-free.  Resume lisinopril.  Lasix on hold.  Will continue aspirin, Plavix and Lipitor  Hypothyroidism TSH low.  No symptoms of hyperthyroidism. Continue levothyroxine epeat TSH, T3, and free T4 in 2 to 4 weeks  Essential hypertension, uncontrolled - Resume home lisinopril.  Continue metoprolol 50 mg twice daily IV as needed  Mixed hyperlipidemia - Continue atorvastatin  Mood disorder/history traumatic brain injury - Continue home bupropion, fluoxetine, Seroquel  ALS ruled out Patient has a diagnosis of ALS and discharged.  He tells me  this was made when he was 72 years old, however he he never had progression of symptoms, and it appears this was an mistaken diagnosis.  Crohn disease Does not appear to be on disease modifying therapy.  Follows with [facility].  Last appt in computer was 2016.  Iron deficiency anemia due to chronic blood loss Folic acid deficiency Mild anemia secondary to iron deficiency.  Will start supplements.  Will also place him on bowel regimen Folic acid supplements. Low normal vitamin B12, will also place him on vitamin B12 supplements These levels can be rechecked by PCP in next 6-8 weeks.  AKI (acute kidney injury), resolved CKD ruled out, baseline Cr 1.0 last Oct. Creatinine during this admission peaked at 1.99.  This morning gets close to baseline  PT recommended SNF.  TOC consulted  DVT prophylaxis: Subcu heparin Code Status: Full code Family Communication:  no one at bedside Status is: Inpatient Plans for SNF placement but patient will have to wait at least 5 days after clearing norovirus.  He is no longer having diarrhea therefore would assume that he is cleared out infection. Earliest they can admit is 4/8   Diet Orders (From admission, onward)     Start     Ordered   06/21/22 2017  Diet Heart Room service appropriate? Yes; Fluid consistency: Thin  Diet effective now       Question Answer Comment  Room service appropriate? Yes   Fluid consistency: Thin      06/21/22 2026            Subjective: No diarrhea, oral intake ok. lower abdominal pain unchanged  Examination: General: Appear  in mild distress; no visible Abnormal Neck Mass Or lumps, Conjunctiva normal Cardiovascular: S1 and S2 Present, no Murmur, Respiratory: good respiratory effort, Bilateral Air entry present and CTA, no Crackles, no wheezes Abdomen: Bowel Sound present, mildly tender in suprapubic area Extremities: no Pedal edema Objective: Vitals:   06/26/22 1657 06/26/22 1955 06/27/22 0326 06/27/22 0816   BP: (!) 159/56 (!) 147/89 113/66 134/66  Pulse: (!) 57 75 64 66  Resp: Temp: (!) 97.4 F (36.3 C) (!) 97.3 F (36.3 C) 97.6 F (36.4 C) 97.7 F (36.5 C)  TempSrc: Oral Oral Oral Oral  SpO2: 100% 98% 97% 98%  Weight:      Height:        Intake/Output Summary (Last 24 hours) at 06/27/2022 1442 Last data filed at 06/27/2022 0328 Gross per 24 hour  Intake 1840 ml  Output 3300 ml  Net -1460 ml    Filed Weights   06/22/22 0500 06/23/22 0500 06/24/22 0500  Weight: 81 kg 79.6 kg 79.5 kg    Scheduled Meds:  aspirin EC  81 mg Oral Daily   atorvastatin  80 mg Oral QHS   buPROPion  150 mg Oral Daily   clopidogrel  75 mg Oral Daily   vitamin B-12  1,000 mcg Oral Daily   ferrous sulfate  325 mg Oral Q breakfast   FLUoxetine  40 mg Oral Daily   folic acid  1 mg Oral Daily   heparin  5,000 Units Subcutaneous Q8H   levothyroxine  25 mcg Oral Q0600   lisinopril  5 mg Oral Daily   metoprolol tartrate  50 mg Oral BID   QUEtiapine  750 mg Oral QHS   Continuous Infusions:  cefTRIAXone (ROCEPHIN)  IV 2 g (06/26/22 1751)    Nutritional status     Body mass index is 29.17 kg/m.  Data Reviewed:   CBC: Recent Labs  Lab 06/21/22 1404 06/21/22 2109 06/23/22 0330 06/24/22 0417 06/25/22 0441 06/26/22 1034 06/27/22 0320  WBC 15.8*   < > 11.1* 10.9* 10.4 8.0 7.8  NEUTROABS 13.6*  --   --   --   --   --   --   HGB 11.0*   < > 10.3* 12.2* 13.2 12.5* 12.5*  HCT 32.9*   < > 30.5* 35.2* 37.3* 35.9* 36.0*  MCV 99.4   < > 97.1 94.1 92.6 93.0 94.7  PLT 201   < > 226 265 295 340 343   < > = values in this interval not displayed.    Basic Metabolic Panel: Recent Labs  Lab 06/23/22 0330 06/24/22 0417 06/25/22 0441 06/26/22 1034 06/27/22 0320  NA 138 134* 135 135 136  K 3.3* 3.8 3.7 3.3* 3.9  CL 100 97* 95* 96* 99  CO2 GLUCOSE 96 106* 96 92 92  BUN CREATININE 1.04 1.06 1.27* 1.10 1.11  CALCIUM 8.4* 8.6* 8.6* 8.5* 8.2*  MG 2.0 2.0  2.1 2.3 2.2    GFR: Estimated Creatinine Clearance: 60.2 mL/min (by C-G formula based on SCr of 1.11 mg/dL). Liver Function Tests: Recent Labs  Lab 06/21/22 1404 06/22/22 0015  AST 17 21  ALT 14 14  ALKPHOS 61 63  BILITOT 1.0 0.8  PROT 6.4* 6.7  ALBUMIN 2.8* 2.9*    No results for input(s): "LIPASE", "AMYLASE" in the last 168 hours. No results for input(s): "AMMONIA" in the last 168 hours. Coagulation Profile: Recent  Labs  Lab 06/21/22 1404  INR 1.3*    Cardiac Enzymes: No results for input(s): "CKTOTAL", "CKMB", "CKMBINDEX", "TROPONINI" in the last 168 hours. BNP (last 3 results) No results for input(s): "PROBNP" in the last 8760 hours. HbA1C: No results for input(s): "HGBA1C" in the last 72 hours. CBG: No results for input(s): "GLUCAP" in the last 168 hours. Lipid Profile: No results for input(s): "CHOL", "HDL", "LDLCALC", "TRIG", "CHOLHDL", "LDLDIRECT" in the last 72 hours. Thyroid Function Tests: No results for input(s): "TSH", "T4TOTAL", "FREET4", "T3FREE", "THYROIDAB" in the last 72 hours.  Anemia Panel: No results for input(s): "VITAMINB12", "FOLATE", "FERRITIN", "TIBC", "IRON", "RETICCTPCT" in the last 72 hours.  Sepsis Labs: Recent Labs  Lab 06/21/22 1432 06/21/22 1559 06/21/22 2109  PROCALCITON  --   --  0.48  LATICACIDVEN 1.9 1.5  --      Recent Results (from the past 240 hour(s))  Blood Culture (routine x 2)     Status: None   Collection Time: 06/21/22  2:30 PM   Specimen: BLOOD LEFT HAND  Result Value Ref Range Status   Specimen Description BLOOD LEFT HAND  Final   Special Requests   Final    BOTTLES DRAWN AEROBIC ONLY Blood Culture results may not be optimal due to an inadequate volume of blood received in culture bottles   Culture   Final    NO GROWTH 5 DAYS Performed at [facility], [address] [place], [place] [address]    Report Status 06/26/2022 FINAL  Final  Blood Culture (routine x 2)     Status: None   Collection  Time: 06/21/22  2:32 PM   Specimen: BLOOD  Result Value Ref Range Status   Specimen Description BLOOD RIGHT ANTECUBITAL  Final   Special Requests   Final    BOTTLES DRAWN AEROBIC AND ANAEROBIC Blood Culture results may not be optimal due to an inadequate volume of blood received in culture bottles   Culture   Final    NO GROWTH 5 DAYS Performed at [facility], [address] [place], [place] [address]    Report Status 06/26/2022 FINAL  Final  Resp panel by RT-PCR (RSV, Flu A&B, Covid)     Status: None   Collection Time: 06/21/22  2:32 PM   Specimen: Nasal Swab  Result Value Ref Range Status   SARS Coronavirus 2 by RT PCR NEGATIVE NEGATIVE Final   Influenza A by PCR NEGATIVE NEGATIVE Final   Influenza B by PCR NEGATIVE NEGATIVE Final    Comment: (NOTE) The Xpert Xpress SARS-CoV-2/FLU/RSV plus assay is intended as an aid in the diagnosis of influenza from Nasopharyngeal swab specimens and should not be used as a sole basis for treatment. Nasal washings and aspirates are unacceptable for Xpert Xpress SARS-CoV-2/FLU/RSV testing.  Fact Sheet for Patients: BloggerCourse.comhttps://www.fda.gov/media/152166/download  Fact Sheet for Healthcare Providers: SeriousBroker.ithttps://www.fda.gov/media/152162/download  This test is not yet approved or cleared by the [place] FDA and has been authorized for detection and/or diagnosis of SARS-CoV-2 by FDA under an Emergency Use Authorization (EUA). This EUA will remain in effect (meaning this test can be used) for the duration of the COVID-19 declaration under Section 564(b)(1) of the Act, 21 U.S.C. section 360bbb-3(b)(1), unless the authorization is terminated or revoked.     Resp Syncytial Virus by PCR NEGATIVE NEGATIVE Final    Comment: (NOTE) Fact Sheet for Patients: BloggerCourse.comhttps://www.fda.gov/media/152166/download  Fact Sheet for Healthcare Providers: SeriousBroker.ithttps://www.fda.gov/media/152162/download  This test is not yet approved or cleared by  the [employer]  and has been authorized for detection and/or diagnosis of SARS-CoV-2 by FDA under an Emergency Use Authorization (EUA). This EUA will remain in effect (meaning this test can be used) for the duration of the COVID-19 declaration under Section 564(b)(1) of the Act, 21 U.S.C. section 360bbb-3(b)(1), unless the authorization is terminated or revoked.  Performed at [facility], [address] [place], [place] [address]   Urine Culture     Status: Abnormal   Collection Time: 06/21/22  3:59 PM   Specimen: Urine, Random  Result Value Ref Range Status   Specimen Description URINE, RANDOM  Final   Special Requests   Final    URINE, CLEAN CATCH Performed at [facility], [address] [place], [place] [address]    Culture >=100,000 COLONIES/mL KLEBSIELLA PNEUMONIAE (A)  Final   Report Status 06/23/2022 FINAL  Final   Organism ID, Bacteria KLEBSIELLA PNEUMONIAE (A)  Final      Susceptibility   Klebsiella pneumoniae - MIC*    AMPICILLIN RESISTANT Resistant     CEFAZOLIN <=4 SENSITIVE Sensitive     CEFEPIME <=0.12 SENSITIVE Sensitive     CEFTRIAXONE <=0.25 SENSITIVE Sensitive     CIPROFLOXACIN <=0.25 SENSITIVE Sensitive     GENTAMICIN <=1 SENSITIVE Sensitive     IMIPENEM <=0.25 SENSITIVE Sensitive     NITROFURANTOIN 32 SENSITIVE Sensitive     TRIMETH/SULFA <=20 SENSITIVE Sensitive     AMPICILLIN/SULBACTAM 4 SENSITIVE Sensitive     PIP/TAZO <=4 SENSITIVE Sensitive     * >=100,000 COLONIES/mL KLEBSIELLA PNEUMONIAE  Urine Culture     Status: Abnormal   Collection Time: 06/21/22  9:09 PM   Specimen: Urine, Random  Result Value Ref Range Status   Specimen Description URINE, RANDOM  Final   Special Requests NONE Reflexed from [id number]  Final   Culture (A)  Final    <10,000 COLONIES/mL INSIGNIFICANT GROWTH Performed at [facility], [address], [place], [place] [address]    Report Status 06/22/2022 FINAL  Final  MRSA Next Gen by PCR, Nasal     Status: None    Collection Time: 06/21/22  9:51 PM   Specimen: Nasal Mucosa; Nasal Swab  Result Value Ref Range Status   MRSA by PCR Next Gen NOT DETECTED NOT DETECTED Final    Comment: (NOTE) The GeneXpert MRSA Assay (FDA approved for NASAL specimens only), is one component of a comprehensive MRSA colonization surveillance program. It is not intended to diagnose MRSA infection nor to guide or monitor treatment for MRSA infections. Test performance is not FDA approved in patients less than 80 years old. Performed at [facility], [address] [place], [place] [address]   C Difficile Quick Screen w PCR reflex     Status: None   Collection Time: 06/23/22  1:16 PM   Specimen: STOOL  Result Value Ref Range Status   C Diff antigen NEGATIVE NEGATIVE Final   C Diff toxin NEGATIVE NEGATIVE Final   C Diff interpretation No C. difficile detected.  Final    Comment: Performed at [facility], [address], [place], [place] [address]  Gastrointestinal Panel by PCR , Stool     Status: Abnormal   Collection Time: 06/23/22  1:16 PM   Specimen: STOOL  Result Value Ref Range Status   Campylobacter species NOT DETECTED NOT DETECTED Final   Plesimonas shigelloides NOT DETECTED NOT DETECTED Final   Salmonella species NOT DETECTED  NOT DETECTED Final   Yersinia enterocolitica NOT DETECTED NOT DETECTED Final   Vibrio species NOT DETECTED NOT DETECTED Final   Vibrio cholerae NOT DETECTED NOT DETECTED Final   Enteroaggregative E coli (EAEC) NOT DETECTED NOT DETECTED Final   Enteropathogenic E coli (EPEC) NOT DETECTED NOT DETECTED Final   Enterotoxigenic E coli (ETEC) NOT DETECTED NOT DETECTED Final   Shiga like toxin producing E coli (STEC) NOT DETECTED NOT DETECTED Final   Shigella/Enteroinvasive E coli (EIEC) NOT DETECTED NOT DETECTED Final   Cryptosporidium NOT DETECTED NOT DETECTED Final   Cyclospora cayetanensis NOT DETECTED NOT DETECTED Final   Entamoeba histolytica NOT DETECTED NOT DETECTED  Final   Giardia lamblia NOT DETECTED NOT DETECTED Final   Adenovirus F40/41 NOT DETECTED NOT DETECTED Final   Astrovirus NOT DETECTED NOT DETECTED Final   Norovirus GI/GII DETECTED (A) NOT DETECTED Final    Comment: RESULT CALLED TO, READ BACK BY AND VERIFIED WITH: HELAINE GUILLAUME AT 1250 ON 06/24/22 BY SS    Rotavirus A NOT DETECTED NOT DETECTED Final   Sapovirus (I, II, IV, and V) NOT DETECTED NOT DETECTED Final    Comment: Performed at [facility], [address] [place], [place] [address]         Radiology Studies: No results found.         LOS: 6 days   Time spent= 35 mins    Lynden Oxford, MD [facility]  If 7PM-7AM, please contact night-coverage  06/27/2022, 2:42 PM

## 2022-06-28 DIAGNOSIS — A419 Sepsis, unspecified organism: Secondary | ICD-10-CM | POA: Diagnosis not present

## 2022-06-28 DIAGNOSIS — R6520 Severe sepsis without septic shock: Secondary | ICD-10-CM | POA: Diagnosis not present

## 2022-06-28 NOTE — Progress Notes (Signed)
[facility] Progress Note Patient: Derek Vargas XOV:[id number] DOB: 02-20-1951 DOA: 06/21/2022  DOS: the patient was seen and examined on 06/28/2022  Brief hospital course: Mr. Derek Vargas is a 72 y.o. M with hx TBI, Crohn's disease, hypothyroidism, HTN, and CAD s/p remote stent who presented with fall, fever, found to have UTI sepsis. Assessment and Plan: Sepsis with end organ damage due to UTI, improving Present on admission.  Met SIRS.  At the time of admission. Sepsis physiology resolved. Urine cultures growing Klebsiella.  Sensitivities reviewed. Treated with IV Rocephin for 7 days. CT abdomen pelvis is negative, no evidence of pyelonephritis at this time   Diarrhea, secondary to norovirus Previous history of Crohn's. CT abdomen negative for any acute gastroenteritis. Diarrhea has resolved.   Coronary artery disease involving native coronary artery of native heart without angina pectoris Currently remains chest pain-free. Holding lisinopril and Lasix. Continue aspirin Plavix and Lipitor.   Hypothyroidism TSH low.  No symptoms of hyperthyroidism. Continue levothyroxine Repeat TSH, T3, and free T4 in 2 to 4 weeks   Essential hypertension, uncontrolled Holding lisinopril. Continue metoprolol 50 mg twice daily   Mixed hyperlipidemia - Continue atorvastatin   Mood disorder/history traumatic brain injury - Continue home bupropion, fluoxetine, Seroquel   ALS ruled out Patient has a diagnosis of ALS and discharged.  He tells me this was made when he was 72 years old, however he he never had progression of symptoms, and it appears this was an mistaken diagnosis.   Crohn disease Does not appear to be on disease modifying therapy.  Follows with [facility].  Last appt in computer was 2016.   Iron deficiency anemia due to chronic blood loss Folic acid deficiency Mild anemia secondary to iron deficiency.  Will start supplements.  Will also place him on bowel regimen Folic acid  supplements. Low normal vitamin B12, will also place him on vitamin B12 supplements These levels can be rechecked by PCP in next 6-8 weeks.   AKI (acute kidney injury), resolved CKD ruled out, baseline Cr 1.0 last Oct. Creatinine during this admission peaked at 1.99.  This morning gets close to baseline   Subjective: No nausea no vomiting no fever no chills.  No diarrhea.  Physical Exam: General: in Mild distress, No Rash Cardiovascular: S1 and S2 Present, No Murmur Respiratory: Good respiratory effort, Bilateral Air entry present. No Crackles, No wheezes Abdomen: Bowel Sound present, No tenderness Extremities: No edema Neuro: Alert and oriented x3, no new focal deficit  Data Reviewed: I have Reviewed nursing notes, Vitals, and Lab results. Since last encounter, pertinent lab results CBC and BMP   . I have ordered test including CBC and BMP  .   Disposition: Status is: Inpatient Remains inpatient appropriate because: Awaiting SNF to take the patient.  Medically stable.  heparin injection 5,000 Units Start: 06/21/22 2200   Family Communication: Discussed with daughter Derek Vargas on phone. Level of care: Med-Surg   Vitals:   06/27/22 1620 06/28/22 0500 06/28/22 0908 06/28/22 1548  BP: (!) 143/60 127/69 (!) 148/68 (!) 153/70  Pulse: 63 71 69 70  Resp: 18 16 14 18   Temp: (!) 97.3 F (36.3 C) 97.9 F (36.6 C) 98.1 F (36.7 C) 97.8 F (36.6 C)  TempSrc: Oral Oral    SpO2: 98% 100% 100% 100%  Weight:  79.4 kg    Height:         Author: Lynden Oxford, MD 06/28/2022 5:01 PM  Please look on [email or web address] to find out who is  on call.

## 2022-06-29 DIAGNOSIS — K5090 Crohn's disease, unspecified, without complications: Secondary | ICD-10-CM | POA: Diagnosis not present

## 2022-06-29 DIAGNOSIS — R41841 Cognitive communication deficit: Secondary | ICD-10-CM | POA: Diagnosis not present

## 2022-06-29 DIAGNOSIS — R6520 Severe sepsis without septic shock: Secondary | ICD-10-CM | POA: Diagnosis not present

## 2022-06-29 DIAGNOSIS — R2681 Unsteadiness on feet: Secondary | ICD-10-CM | POA: Diagnosis not present

## 2022-06-29 DIAGNOSIS — Z7401 Bed confinement status: Secondary | ICD-10-CM | POA: Diagnosis not present

## 2022-06-29 DIAGNOSIS — A419 Sepsis, unspecified organism: Secondary | ICD-10-CM | POA: Diagnosis not present

## 2022-06-29 DIAGNOSIS — M6281 Muscle weakness (generalized): Secondary | ICD-10-CM | POA: Diagnosis not present

## 2022-06-29 DIAGNOSIS — G1221 Amyotrophic lateral sclerosis: Secondary | ICD-10-CM | POA: Diagnosis not present

## 2022-06-29 DIAGNOSIS — K5900 Constipation, unspecified: Secondary | ICD-10-CM | POA: Diagnosis not present

## 2022-06-29 DIAGNOSIS — Z8673 Personal history of transient ischemic attack (TIA), and cerebral infarction without residual deficits: Secondary | ICD-10-CM | POA: Diagnosis not present

## 2022-06-29 DIAGNOSIS — E039 Hypothyroidism, unspecified: Secondary | ICD-10-CM | POA: Diagnosis not present

## 2022-06-29 DIAGNOSIS — Z9181 History of falling: Secondary | ICD-10-CM | POA: Diagnosis not present

## 2022-06-29 DIAGNOSIS — F331 Major depressive disorder, recurrent, moderate: Secondary | ICD-10-CM | POA: Diagnosis not present

## 2022-06-29 DIAGNOSIS — Z87820 Personal history of traumatic brain injury: Secondary | ICD-10-CM | POA: Diagnosis not present

## 2022-06-29 DIAGNOSIS — E785 Hyperlipidemia, unspecified: Secondary | ICD-10-CM | POA: Diagnosis not present

## 2022-06-29 DIAGNOSIS — B961 Klebsiella pneumoniae [K. pneumoniae] as the cause of diseases classified elsewhere: Secondary | ICD-10-CM | POA: Diagnosis not present

## 2022-06-29 DIAGNOSIS — N390 Urinary tract infection, site not specified: Secondary | ICD-10-CM | POA: Diagnosis not present

## 2022-06-29 DIAGNOSIS — F4310 Post-traumatic stress disorder, unspecified: Secondary | ICD-10-CM | POA: Diagnosis not present

## 2022-06-29 DIAGNOSIS — R531 Weakness: Secondary | ICD-10-CM | POA: Diagnosis not present

## 2022-06-29 DIAGNOSIS — I10 Essential (primary) hypertension: Secondary | ICD-10-CM | POA: Diagnosis not present

## 2022-06-29 DIAGNOSIS — R2689 Other abnormalities of gait and mobility: Secondary | ICD-10-CM | POA: Diagnosis not present

## 2022-06-29 LAB — BASIC METABOLIC PANEL
Anion gap: 14 (ref 5–15)
BUN: 15 mg/dL (ref 8–23)
CO2: 23 mmol/L (ref 22–32)
Calcium: 9 mg/dL (ref 8.9–10.3)
Chloride: 99 mmol/L (ref 98–111)
Creatinine, Ser: 1.04 mg/dL (ref 0.61–1.24)
GFR, Estimated: >60 mL/min (ref >60)
Glucose, Bld: 99 mg/dL (ref 70–99)
Potassium: 4.2 mmol/L (ref 3.5–5.1)
Sodium: 136 mmol/L (ref 135–145)

## 2022-06-29 LAB — MAGNESIUM: Magnesium: 2.4 mg/dL (ref 1.7–2.4)

## 2022-06-29 MED ORDER — HYDROCORTISONE (PERIANAL) 2.5 % EX CREA: 1.0000 | TOPICAL_CREAM | Freq: Four times a day (QID) | CUTANEOUS | 0 refills | Status: AC | PRN

## 2022-06-29 MED ORDER — HYDROCODONE-ACETAMINOPHEN 5-325 MG PO TABS: 1.0000 | ORAL_TABLET | Freq: Three times a day (TID) | ORAL | 0 refills | Status: AC | PRN

## 2022-06-29 MED ORDER — FOLIC ACID 1 MG PO TABS: 1.0000 mg | ORAL_TABLET | Freq: Every day | ORAL | 0 refills | Status: AC

## 2022-06-29 MED ORDER — HYDROCODONE-ACETAMINOPHEN 5-325 MG PO TABS: 1.0000 | ORAL_TABLET | Freq: Three times a day (TID) | ORAL | 0 refills | Status: DC | PRN

## 2022-06-29 MED ORDER — CYANOCOBALAMIN 1000 MCG PO TABS: 1000.0000 ug | ORAL_TABLET | Freq: Every day | ORAL | 0 refills | Status: AC

## 2022-06-29 MED ORDER — FERROUS SULFATE 325 (65 FE) MG PO TABS: 325.0000 mg | ORAL_TABLET | Freq: Every day | ORAL | 0 refills | Status: AC

## 2022-06-29 MED ORDER — FUROSEMIDE 20 MG PO TABS: 20.0000 mg | ORAL_TABLET | Freq: Every day | ORAL | 0 refills | Status: AC | PRN

## 2022-06-29 MED ORDER — LEVOTHYROXINE SODIUM 50 MCG PO TABS: 50.0000 ug | ORAL_TABLET | Freq: Every day | ORAL | 0 refills | Status: AC

## 2022-06-29 NOTE — TOC Progression Note (Addendum)
Transition of Care [facility]) - Progression Note    Patient Details  Name: Derek Vargas MRN: [id number] Date of Birth: 01/09/1951  Transition of Care [facility]) CM/SW Contact  Mearl Latin, LCSW Phone Number: 06/29/2022, 8:35 AM  Clinical Narrative:    8:35am-Per chart review, patient able to be accepted by [facility] today. However, insurance approval expired on 06/26/22. CSW made therapy aware of need for updated note to start insurance approval process again. [facility] aware.   10am-CSW submitted updated clinicals to insurance for review, Ref# [id number]. CSW spoke with patient's daughter and obtained correct phone number for spouse, [phone], to complete paperwork.  11am-CSW spoke with insurance and they stated patient can use the prior insurance approval authorization until midnight tonight. CSW updated [facility].     Expected Discharge Plan: Skilled Nursing Facility Barriers to Discharge: No Barriers Identified  Expected Discharge Plan and Services   Discharge Planning Services: CM Consult Post Acute Care Choice: Skilled Nursing Facility Living arrangements for the past 2 months: Apartment                                       Social Determinants of Health (SDOH) Interventions SDOH Screenings   Depression (PHQ2-9): Low Risk  (12/22/2018)  Tobacco Use: Medium Risk (06/21/2022)    Readmission Risk Interventions    06/24/2022    8:31 AM  Readmission Risk Prevention Plan  Transportation Screening Complete  PCP or Specialist Appt within 5-7 Days Complete  Home Care Screening Complete  Medication Review (RN CM) Complete

## 2022-06-29 NOTE — TOC Transition Note (Signed)
Transition of Care [facility]) - CM/SW Discharge Note   Patient Details  Name: Derek Vargas MRN: [id number] Date of Birth: August 22, 1950  [facility] CM/SW Contact:  Mearl Latin, LCSW Phone Number: 06/29/2022, 12:54 PM   Clinical Narrative:    Patient will DC to: [facility] Anticipated DC date: 06/29/22 Family notified: Spouse, daughter Transport by: Sharin Mons   Per MD patient ready for DC to [facility]. RN to call report prior to discharge [phone] room 505). RN, patient, patient's family, and facility notified of DC. Discharge Summary and FL2 sent to facility. DC packet on chart including signed script. Ambulance transport requested for patient.   CSW will sign off for now as social work intervention is no longer needed. Please consult [place] again if new needs arise.     Final next level of care: Skilled Nursing Facility Barriers to Discharge: No Barriers Identified   Patient Goals and CMS Choice CMS Medicare.gov Compare Post Acute Care list provided to:: Patient Choice offered to / list presented to : Patient  Discharge Placement     Existing PASRR number confirmed : 06/29/22          Patient chooses bed at: [facility] Patient to be transferred to facility by: [facility] Name of family member notified: Daughter, spouse Patient and family notified of of transfer: 06/29/22  Discharge Plan and Services Additional resources added to the After Visit Summary for     Discharge Planning Services: CM Consult Post Acute Care Choice: Skilled Nursing Facility                               Social Determinants of Health (SDOH) Interventions SDOH Screenings   Food Insecurity: No Food Insecurity (06/29/2022)  Housing: Low Risk  (06/29/2022)  Transportation Needs: No Transportation Needs (06/29/2022)  Utilities: Not At Risk (06/29/2022)  Depression (PHQ2-9): Low Risk  (12/22/2018)  Tobacco Use: Medium Risk (06/21/2022)     Readmission Risk  Interventions    06/24/2022    8:31 AM  Readmission Risk Prevention Plan  Transportation Screening Complete  PCP or Specialist Appt within 5-7 Days Complete  Home Care Screening Complete  Medication Review (RN CM) Complete

## 2022-06-29 NOTE — Discharge Summary (Signed)
Physician Discharge Summary   Patient: Derek Vargas MRN: [id number] DOB: [birth year]  Admit date:     06/21/2022  Discharge date: 06/29/22  Discharge Physician: Lynden Oxford  PCP: Street, Stephanie Coup, MD  Recommendations at discharge:  Follow up with PCP in 1 week   Follow-up Information     Street, Stephanie Coup, MD. Schedule an appointment as soon as possible for a visit in 1 week(s).   Specialty: Family Medicine Contact information: [address] [place] [address] [phone]                Discharge Diagnoses: Principal Problem:   Sepsis with end organ damage due to UTI Active Problems:   Coronary artery disease involving native coronary artery of native heart without angina pectoris   Hypothyroidism   Mixed hyperlipidemia   AKI (acute kidney injury)   Iron deficiency anemia due to chronic blood loss   Crohn disease   ALS ruled out   Mood disorder/history traumatic brain injury  Hospital Course: Derek Vargas is a 72 y.o. M with hx TBI, Crohn's disease, hypothyroidism, HTN, and CAD s/p remote stent who presented with fall, fever, found to have UTI sepsis.His cultures eventually grew Klebsiella which was sensitive to IV Rocephin. Hospital course complicated by development of diarrhea. Eventually C. difficile was negative. GI panel was positive for norovirus. PT recommended SNF. SNF wants him to wait at least 5 days after his diarrhea has resolved. Earliest we can admit is 4/8   Assessment and Plan  Sepsis with end organ damage due to UTI, improving Present on admission.  Met SIRS.  At the time of admission. Sepsis physiology resolved. Urine cultures growing Klebsiella.  Sensitivities reviewed. Treated with IV Rocephin for 7 days. CT abdomen pelvis is negative, no evidence of pyelonephritis at this time   Diarrhea, secondary to norovirus Previous history of Crohn's. CT abdomen negative for any acute gastroenteritis. Diarrhea has resolved.    Coronary artery disease involving native coronary artery of native heart without angina pectoris Currently remains chest pain-free. Held lisinopril and Lasix due to AKI, now resumed lisinopril. Lasix only as PRN Continue aspirin Plavix and Lipitor.   Hypothyroidism TSH low.  No symptoms of hyperthyroidism. Continue levothyroxine. Unclear what dose of synthroid pt is on. Medrec says 75 mcg and cardiology note recently says 50 mcg. We will lower to 50 mcg and recommended Repeat TSH, T3, and free T4 in 2 to 4 weeks  Essential hypertension, uncontrolled Medrec completed. Pt is on toprol xl 25 bid now, will resume. Also resumed lisinopril. Bp stable. Lasix prn   Mixed hyperlipidemia Continue atorvastatin   Mood disorder/history traumatic brain injury Continue home fluoxetine, Seroquel and remeron.  Pt is not on bupropion at home per [facility]   ALS ruled out Patient has a diagnosis of ALS. He tells me this was made when he was 72 years old, however he never had progression of symptoms, and it appears this was an mistaken diagnosis.  Crohn disease Does not appear to be on disease modifying therapy.  Follows with [facility].  Last appt in computer was 2016.   Iron deficiency anemia due to chronic blood loss Folic acid deficiency Mild anemia secondary to iron deficiency.   Will start supplements. Low normal vitamin B12, will also place him on vitamin B12 supplements These levels can be rechecked by PCP in next 6-8 weeks.   AKI (acute kidney injury), resolved CKD ruled out, baseline Cr 1.0 last Oct. Creatinine during this admission  peaked at 1.99.  Pain control - [employer] Controlled Substance Reporting System database was reviewed. and patient was instructed, not to drive, operate heavy machinery, perform activities at heights, swimming or participation in water activities or provide baby-sitting services while on Pain, Sleep and Anxiety Medications; until their outpatient Physician has  advised to do so again. Also recommended to not to take more than prescribed Pain, Sleep and Anxiety Medications.  Consultants:  none  Procedures performed:  noen  DISCHARGE MEDICATION: Allergies as of 06/29/2022       Reactions   Codeine Nausea And Vomiting        Medication List     STOP taking these medications    baclofen 10 MG tablet Commonly known as: LIORESAL   clonazePAM 1 MG tablet Commonly known as: KLONOPIN   metoprolol tartrate 50 MG tablet Commonly known as: LOPRESSOR   ondansetron 4 MG disintegrating tablet Commonly known as: ZOFRAN-ODT       TAKE these medications    aspirin EC 81 MG tablet Take 81 mg by mouth daily.   atorvastatin 80 MG tablet Commonly known as: LIPITOR Take 80 mg by mouth daily.   clopidogrel 75 MG tablet Commonly known as: PLAVIX Take 1 tablet (75 mg total) by mouth daily.   cyanocobalamin 1000 MCG tablet Take 1 tablet (1,000 mcg total) by mouth daily.   doxepin 100 MG capsule Commonly known as: SINEQUAN Take 100 mg by mouth at bedtime.   ferrous sulfate 325 (65 FE) MG tablet Take 1 tablet (325 mg total) by mouth daily with breakfast. Start taking on: June 30, 2022   FLUoxetine 40 MG capsule Commonly known as: PROZAC Take 40 mg by mouth daily.   folic acid 1 MG tablet Commonly known as: FOLVITE Take 1 tablet (1 mg total) by mouth daily.   furosemide 20 MG tablet Commonly known as: LASIX Take 1 tablet (20 mg total) by mouth daily as needed (weight gain of >3 lbs in1 day or >5 lbs in 1 week). What changed:  when to take this reasons to take this   HYDROcodone-acetaminophen 5-325 MG tablet Commonly known as: NORCO/VICODIN Take 1 tablet by mouth every 8 (eight) hours as needed.   hydrocortisone 2.5 % rectal cream Commonly known as: ANUSOL-HC Apply 1 Application topically 4 (four) times daily as needed for hemorrhoids.   levothyroxine 50 MCG tablet Commonly known as: SYNTHROID Take 1 tablet (50 mcg total)  by mouth daily. What changed:  medication strength how much to take Another medication with the same name was removed. Continue taking this medication, and follow the directions you see here.   lisinopril 5 MG tablet Commonly known as: ZESTRIL Take 5 mg by mouth daily.   Melatonin 10 MG Tabs Take 10 mg by mouth at bedtime.   metoprolol succinate 25 MG 24 hr tablet Commonly known as: TOPROL-XL Take 25 mg by mouth 2 (two) times daily.   mirtazapine 15 MG tablet Commonly known as: REMERON Take 15 mg by mouth at bedtime.   nitroGLYCERIN 0.4 MG SL tablet Commonly known as: NITROSTAT Place 0.4 mg under the tongue every 5 (five) minutes as needed for chest pain.   primidone 250 MG tablet Commonly known as: MYSOLINE Take 1 tablet (250 mg total) by mouth 4 (four) times daily.   QUEtiapine 300 MG tablet Commonly known as: SEROQUEL Take 750 mg by mouth at bedtime.       Disposition: SNF Diet recommendation: Cardiac diet  Discharge Exam: Vitals:  06/28/22 0908 06/28/22 1548 06/28/22 2105 06/29/22 0338  BP: (!) 148/68 (!) 153/70 138/76 110/71  Pulse: 69 70 65 68  Resp: 14 18 17 18   Temp: 98.1 F (36.7 C) 97.8 F (36.6 C) (!) 97.3 F (36.3 C) 97.7 F (36.5 C)  TempSrc:   Oral Oral  SpO2: 100% 100% 98% 97%  Weight:      Height:       General: Appear in no distress; no visible Abnormal Neck Mass Or lumps, Conjunctiva normal Cardiovascular: S1 and S2 Present, no Murmur, Respiratory: good respiratory effort, Bilateral Air entry present and CTA, no Crackles, no wheezes Abdomen: Bowel Sound present, Non tender  Extremities: no Pedal edema Neurology: alert and oriented to time, place, and person  Filed Weights   06/23/22 0500 06/24/22 0500 06/28/22 0500  Weight: 79.6 kg 79.5 kg 79.4 kg   Condition at discharge: stable  The results of significant diagnostics from this hospitalization (including imaging, microbiology, ancillary and laboratory) are listed below for  reference.   Imaging Studies: CT CHEST ABDOMEN PELVIS WO CONTRAST  Result Date: 06/21/2022 CLINICAL DATA:  Sepsis EXAM: CT CHEST, ABDOMEN AND PELVIS WITHOUT CONTRAST TECHNIQUE: Multidetector CT imaging of the chest, abdomen and pelvis was performed following the standard protocol without IV contrast. RADIATION DOSE REDUCTION: This exam was performed according to the departmental dose-optimization program which includes automated exposure control, adjustment of the mA and/or kV according to patient size and/or use of iterative reconstruction technique. COMPARISON:  Chest radiography same day. CT 01/10/2021. CT 01/20/2017. FINDINGS: CT CHEST FINDINGS Cardiovascular: Heart size is normal. Coronary artery calcification and aortic atherosclerotic calcification are present. No pericardial effusion. Mediastinum/Nodes: No mass or lymphadenopathy. Lungs/Pleura: No pleural effusion. Scattered areas of pulmonary scarring. No sign of acute infectious pneumonia. No lobar collapse. No effusion. I do not believe that there is any pulmonary nodule requiring follow-up. Musculoskeletal: Unremarkable appearance of the spine, sternum and ribs. CT ABDOMEN PELVIS FINDINGS Hepatobiliary: Liver parenchyma is normal. No calcified gallstones. No CT evidence of gallbladder inflammation. Pancreas: Normal Spleen: Normal Adrenals/Urinary Tract: Adrenal glands are normal. Kidneys are normal. No cyst, mass, stone or hydronephrosis. No evidence of renal infection. The bladder is normal. Stomach/Bowel: Stomach and small intestine are normal. Normal appendix. Fairly large amount of fecal matter in the colon, but no evidence of acute colon pathology. No diverticulosis or diverticulitis. Vascular/Lymphatic: Aortic atherosclerosis. No aneurysm. IVC is normal. No adenopathy. Reproductive: Normal Other: No free fluid or air. Musculoskeletal: Ordinary mild lower lumbar degenerative changes. Previous ORIF of a right femur fracture. IMPRESSION: 1. No  acute chest finding. Scattered areas of pulmonary scarring. No sign of acute infectious pneumonia. 2. No acute abdominal or pelvic finding. 3. Aortic atherosclerosis. Coronary artery calcification. 4. Fairly large amount of fecal matter in the colon, but no evidence of acute colon pathology. Aortic Atherosclerosis (ICD10-I70.0). Electronically Signed   By: Paulina Fusi M.D.   On: 06/21/2022 17:13   DG Chest Port 1 View  Result Date: 06/21/2022 CLINICAL DATA:  Sepsis, hypotension EXAM: PORTABLE CHEST 1 VIEW COMPARISON:  04/18/2022 FINDINGS: 2 frontal views of the chest are obtained. The cardiac silhouette is stable. Chronic elevation of the right hemidiaphragm. No airspace disease, effusion, or pneumothorax. IMPRESSION: 1. No acute intrathoracic process. Electronically Signed   By: Sharlet Salina M.D.   On: 06/21/2022 14:37    Microbiology: Results for orders placed or performed during the hospital encounter of 06/21/22  Blood Culture (routine x 2)     Status: None  Collection Time: 06/21/22  2:30 PM   Specimen: BLOOD LEFT HAND  Result Value Ref Range Status   Specimen Description BLOOD LEFT HAND  Final   Special Requests   Final    BOTTLES DRAWN AEROBIC ONLY Blood Culture results may not be optimal due to an inadequate volume of blood received in culture bottles   Culture   Final    NO GROWTH 5 DAYS Performed at [facility], [address], [place], [place] [address]    Report Status 06/26/2022 FINAL  Final  Blood Culture (routine x 2)     Status: None   Collection Time: 06/21/22  2:32 PM   Specimen: BLOOD  Result Value Ref Range Status   Specimen Description BLOOD RIGHT ANTECUBITAL  Final   Special Requests   Final    BOTTLES DRAWN AEROBIC AND ANAEROBIC Blood Culture results may not be optimal due to an inadequate volume of blood received in culture bottles   Culture   Final    NO GROWTH 5 DAYS Performed at [facility], [address], [place], [place] [address]     Report Status 06/26/2022 FINAL  Final  Resp panel by RT-PCR (RSV, Flu A&B, Covid)     Status: None   Collection Time: 06/21/22  2:32 PM   Specimen: Nasal Swab  Result Value Ref Range Status   SARS Coronavirus 2 by RT PCR NEGATIVE NEGATIVE Final   Influenza A by PCR NEGATIVE NEGATIVE Final   Influenza B by PCR NEGATIVE NEGATIVE Final    Comment: (NOTE) The Xpert Xpress SARS-CoV-2/FLU/RSV plus assay is intended as an aid in the diagnosis of influenza from Nasopharyngeal swab specimens and should not be used as a sole basis for treatment. Nasal washings and aspirates are unacceptable for Xpert Xpress SARS-CoV-2/FLU/RSV testing.  Fact Sheet for Patients: BloggerCourse.com  Fact Sheet for Healthcare Providers: SeriousBroker.it  This test is not yet approved or cleared by the [place] FDA and has been authorized for detection and/or diagnosis of SARS-CoV-2 by FDA under an Emergency Use Authorization (EUA). This EUA will remain in effect (meaning this test can be used) for the duration of the COVID-19 declaration under Section 564(b)(1) of the Act, 21 U.S.C. section 360bbb-3(b)(1), unless the authorization is terminated or revoked.     Resp Syncytial Virus by PCR NEGATIVE NEGATIVE Final    Comment: (NOTE) Fact Sheet for Patients: BloggerCourse.com  Fact Sheet for Healthcare Providers: SeriousBroker.it  This test is not yet approved or cleared by the [place] FDA and has been authorized for detection and/or diagnosis of SARS-CoV-2 by FDA under an Emergency Use Authorization (EUA). This EUA will remain in effect (meaning this test can be used) for the duration of the COVID-19 declaration under Section 564(b)(1) of the Act, 21 U.S.C. section 360bbb-3(b)(1), unless the authorization is terminated or revoked.  Performed at [facility], [address], [place],  [place] [address]   Urine Culture     Status: Abnormal   Collection Time: 06/21/22  3:59 PM   Specimen: Urine, Random  Result Value Ref Range Status   Specimen Description URINE, RANDOM  Final   Special Requests   Final    URINE, CLEAN CATCH Performed at [facility], [address], [place], [place] [address]    Culture >=100,000 COLONIES/mL KLEBSIELLA PNEUMONIAE (A)  Final   Report Status 06/23/2022 FINAL  Final   Organism ID, Bacteria KLEBSIELLA PNEUMONIAE (A)  Final  Susceptibility   Klebsiella pneumoniae - MIC*    AMPICILLIN RESISTANT Resistant     CEFAZOLIN <=4 SENSITIVE Sensitive     CEFEPIME <=0.12 SENSITIVE Sensitive     CEFTRIAXONE <=0.25 SENSITIVE Sensitive     CIPROFLOXACIN <=0.25 SENSITIVE Sensitive     GENTAMICIN <=1 SENSITIVE Sensitive     IMIPENEM <=0.25 SENSITIVE Sensitive     NITROFURANTOIN 32 SENSITIVE Sensitive     TRIMETH/SULFA <=20 SENSITIVE Sensitive     AMPICILLIN/SULBACTAM 4 SENSITIVE Sensitive     PIP/TAZO <=4 SENSITIVE Sensitive     * >=100,000 COLONIES/mL KLEBSIELLA PNEUMONIAE  Urine Culture     Status: Abnormal   Collection Time: 06/21/22  9:09 PM   Specimen: Urine, Random  Result Value Ref Range Status   Specimen Description URINE, RANDOM  Final   Special Requests NONE Reflexed from [id number]  Final   Culture (A)  Final    <10,000 COLONIES/mL INSIGNIFICANT GROWTH Performed at [facility], [address] [place], [place] [address]    Report Status 06/22/2022 FINAL  Final  MRSA Next Gen by PCR, Nasal     Status: None   Collection Time: 06/21/22  9:51 PM   Specimen: Nasal Mucosa; Nasal Swab  Result Value Ref Range Status   MRSA by PCR Next Gen NOT DETECTED NOT DETECTED Final    Comment: (NOTE) The GeneXpert MRSA Assay (FDA approved for NASAL specimens only), is one component of a comprehensive MRSA colonization surveillance program. It is not intended to diagnose MRSA infection nor to guide or monitor treatment for MRSA  infections. Test performance is not FDA approved in patients less than 147 years old. Performed at [facility], [address] [place], [place] [address]   C Difficile Quick Screen w PCR reflex     Status: None   Collection Time: 06/23/22  1:16 PM   Specimen: STOOL  Result Value Ref Range Status   C Diff antigen NEGATIVE NEGATIVE Final   C Diff toxin NEGATIVE NEGATIVE Final   C Diff interpretation No C. difficile detected.  Final    Comment: Performed at [facility], [address] [place], [place] [address]  Gastrointestinal Panel by PCR , Stool     Status: Abnormal   Collection Time: 06/23/22  1:16 PM   Specimen: STOOL  Result Value Ref Range Status   Campylobacter species NOT DETECTED NOT DETECTED Final   Plesimonas shigelloides NOT DETECTED NOT DETECTED Final   Salmonella species NOT DETECTED NOT DETECTED Final   Yersinia enterocolitica NOT DETECTED NOT DETECTED Final   Vibrio species NOT DETECTED NOT DETECTED Final   Vibrio cholerae NOT DETECTED NOT DETECTED Final   Enteroaggregative E coli (EAEC) NOT DETECTED NOT DETECTED Final   Enteropathogenic E coli (EPEC) NOT DETECTED NOT DETECTED Final   Enterotoxigenic E coli (ETEC) NOT DETECTED NOT DETECTED Final   Shiga like toxin producing E coli (STEC) NOT DETECTED NOT DETECTED Final   Shigella/Enteroinvasive E coli (EIEC) NOT DETECTED NOT DETECTED Final   Cryptosporidium NOT DETECTED NOT DETECTED Final   Cyclospora cayetanensis NOT DETECTED NOT DETECTED Final   Entamoeba histolytica NOT DETECTED NOT DETECTED Final   Giardia lamblia NOT DETECTED NOT DETECTED Final   Adenovirus F40/41 NOT DETECTED NOT DETECTED Final   Astrovirus NOT DETECTED NOT DETECTED Final   Norovirus GI/GII DETECTED (A) NOT DETECTED Final    Comment: RESULT CALLED TO, READ BACK BY AND VERIFIED WITH: HELAINE GUILLAUME AT 1250 ON 06/24/22 BY SS  Rotavirus A NOT DETECTED NOT DETECTED Final   Sapovirus (I, II, IV, and V) NOT DETECTED NOT DETECTED  Final    Comment: Performed at [facility], [address] [place], [place] [address]   Labs: CBC: Recent Labs  Lab 06/23/22 0330 06/24/22 0417 06/25/22 0441 06/26/22 1034 06/27/22 0320  WBC 11.1* 10.9* 10.4 8.0 7.8  HGB 10.3* 12.2* 13.2 12.5* 12.5*  HCT 30.5* 35.2* 37.3* 35.9* 36.0*  MCV 97.1 94.1 92.6 93.0 94.7  PLT 226 265 295 340 343   Basic Metabolic Panel: Recent Labs  Lab 06/23/22 0330 06/24/22 0417 06/25/22 0441 06/26/22 1034 06/27/22 0320  NA 138 134* 135 135 136  K 3.3* 3.8 3.7 3.3* 3.9  CL 100 97* 95* 96* 99  CO2 26 25 27 26 25   GLUCOSE 96 106* 96 92 92  BUN 13 13 15 20 18   CREATININE 1.04 1.06 1.27* 1.10 1.11  CALCIUM 8.4* 8.6* 8.6* 8.5* 8.2*  MG 2.0 2.0 2.1 2.3 2.2   Liver Function Tests: No results for input(s): "AST", "ALT", "ALKPHOS", "BILITOT", "PROT", "ALBUMIN" in the last 168 hours. CBG: No results for input(s): "GLUCAP" in the last 168 hours.  Discharge time spent: greater than 30 minutes.  Signed: Lynden Oxford, MD [facility]

## 2022-06-29 NOTE — Progress Notes (Signed)
Physical Therapy Treatment Patient Details Name: Derek Vargas MRN: [id number] DOB: Jul 12, 1950 Today's Date: 06/29/2022   History of Present Illness 72 y.o. presented to ED via EMS with fall, fever, found to have UTI sepsis. PMH: hypertension, hyperlipidemia, coronary artery disease (Hx STEMI with BMS to LAD 2010), anxiety disorder, hypothyroidism, essential tremor,  gastroesophageal reflux disease, Alzheimer's dementia, ALS, Crohn's disease, stroke (10/2018 S/P TPA)    PT Comments    Patient received in bed, agrees to PT session. Patient reports feeling weak. Says he was very shaky getting up yesterday with nursing. Patient is mor I with bed mobility. Transfers with min A from low bed. Requires cues for hand placement and increased effort to stand. He reports light headed feeling upon sitting and standing. Patient is shaky when standing and agrees to get over to recliner ~6 feet from bed. Patient required min A and RW for this. Performed a few LE seated exercises at end of session. Patient left in recliner with chair alarm donned, needs met. He will continue to benefit from skilled PT to improve strength and functional independence.    Recommendations for follow up therapy are one component of a multi-disciplinary discharge planning process, led by the attending physician.  Recommendations may be updated based on patient status, additional functional criteria and insurance authorization.  Follow Up Recommendations  Can patient physically be transported by private vehicle: Yes    Assistance Recommended at Discharge Frequent or constant Supervision/Assistance  Patient can return home with the following A lot of help with walking and/or transfers;Help with stairs or ramp for entrance;A lot of help with bathing/dressing/bathroom;Assistance with cooking/housework;Assist for transportation   Equipment Recommendations  None recommended by PT    Recommendations for Other Services        Precautions / Restrictions Precautions Precautions: Fall Precaution Comments: many falls, enteric precautions, +noro virus Restrictions Weight Bearing Restrictions: No     Mobility  Bed Mobility Overal bed mobility: Modified Independent Bed Mobility: Supine to Sit     Supine to sit: Modified independent (Device/Increase time)          Transfers Overall transfer level: Needs assistance Equipment used: Rolling walker (2 wheels) Transfers: Sit to/from Stand Sit to Stand: Min guard           General transfer comment: min guard with pt using correct hand placement, increased effort    Ambulation/Gait Ambulation/Gait assistance: Min guard Gait Distance (Feet): 6 Feet Assistive device: Rolling walker (2 wheels) Gait Pattern/deviations: Step-to pattern, Decreased step length - right, Decreased step length - left Gait velocity: reduced     General Gait Details: Patient reports light headed with sitting and standing. He is shaky with standing.   Stairs             Wheelchair Mobility    Modified Rankin (Stroke Patients Only)       Balance Overall balance assessment: Needs assistance Sitting-balance support: Feet supported Sitting balance-Leahy Scale: Good     Standing balance support: Bilateral upper extremity supported, During functional activity, Reliant on assistive device for balance Standing balance-Leahy Scale: Fair                              Cognition Arousal/Alertness: Awake/alert Behavior During Therapy: Flat affect Overall Cognitive Status: History of cognitive impairments - at baseline  Exercises Other Exercises Other Exercises: seated BLE LAQ, marching x 10 reps    General Comments        Pertinent Vitals/Pain Pain Assessment Pain Assessment: No/denies pain    Home Living                          Prior Function            PT Goals (current  goals can now be found in the care plan section) Acute Rehab PT Goals Patient Stated Goal: to get stronger PT Goal Formulation: With patient Time For Goal Achievement: 07/06/22 Potential to Achieve Goals: Good Progress towards PT goals: Progressing toward goals    Frequency    Min 3X/week      PT Plan Current plan remains appropriate    Co-evaluation              AM-PAC PT "6 Clicks" Mobility   Outcome Measure  Help needed turning from your back to your side while in a flat bed without using bedrails?: None Help needed moving from lying on your back to sitting on the side of a flat bed without using bedrails?: None Help needed moving to and from a bed to a chair (including a wheelchair)?: A Little Help needed standing up from a chair using your arms (e.g., wheelchair or bedside chair)?: A Little Help needed to walk in hospital room?: A Lot Help needed climbing 3-5 steps with a railing? : Total 6 Click Score: 17    End of Session Equipment Utilized During Treatment: Gait belt Activity Tolerance: Patient limited by fatigue Patient left: in chair;with call bell/phone within reach;with chair alarm set Nurse Communication: Mobility status PT Visit Diagnosis: Unsteadiness on feet (R26.81);Other abnormalities of gait and mobility (R26.89);Repeated falls (R29.6);Muscle weakness (generalized) (M62.81);History of falling (Z91.81);Difficulty in walking, not elsewhere classified (R26.2);Dizziness and giddiness (R42)     Time: 9842-1031 PT Time Calculation (min) (ACUTE ONLY): 12 min  Charges:  $Therapeutic Exercise: 8-22 mins                     Koree Schopf, PT, GCS 06/29/22,9:54 AM

## 2022-06-30 DIAGNOSIS — F331 Major depressive disorder, recurrent, moderate: Secondary | ICD-10-CM | POA: Diagnosis not present

## 2022-06-30 DIAGNOSIS — I10 Essential (primary) hypertension: Secondary | ICD-10-CM | POA: Diagnosis not present

## 2022-06-30 DIAGNOSIS — Z8673 Personal history of transient ischemic attack (TIA), and cerebral infarction without residual deficits: Secondary | ICD-10-CM | POA: Diagnosis not present

## 2022-06-30 DIAGNOSIS — F4310 Post-traumatic stress disorder, unspecified: Secondary | ICD-10-CM | POA: Diagnosis not present

## 2022-06-30 DIAGNOSIS — Z87820 Personal history of traumatic brain injury: Secondary | ICD-10-CM | POA: Diagnosis not present

## 2022-07-01 DIAGNOSIS — E785 Hyperlipidemia, unspecified: Secondary | ICD-10-CM | POA: Diagnosis not present

## 2022-07-01 DIAGNOSIS — E039 Hypothyroidism, unspecified: Secondary | ICD-10-CM | POA: Diagnosis not present

## 2022-07-01 DIAGNOSIS — K5090 Crohn's disease, unspecified, without complications: Secondary | ICD-10-CM | POA: Diagnosis not present

## 2022-07-01 DIAGNOSIS — I10 Essential (primary) hypertension: Secondary | ICD-10-CM | POA: Diagnosis not present

## 2022-07-02 DIAGNOSIS — Z9181 History of falling: Secondary | ICD-10-CM | POA: Diagnosis not present

## 2022-07-02 DIAGNOSIS — N390 Urinary tract infection, site not specified: Secondary | ICD-10-CM | POA: Diagnosis not present

## 2022-07-02 DIAGNOSIS — M6281 Muscle weakness (generalized): Secondary | ICD-10-CM | POA: Diagnosis not present

## 2022-07-02 DIAGNOSIS — R2689 Other abnormalities of gait and mobility: Secondary | ICD-10-CM | POA: Diagnosis not present

## 2022-07-02 DIAGNOSIS — R2681 Unsteadiness on feet: Secondary | ICD-10-CM | POA: Diagnosis not present

## 2022-07-06 DIAGNOSIS — B961 Klebsiella pneumoniae [K. pneumoniae] as the cause of diseases classified elsewhere: Secondary | ICD-10-CM | POA: Diagnosis not present

## 2022-07-06 DIAGNOSIS — R2689 Other abnormalities of gait and mobility: Secondary | ICD-10-CM | POA: Diagnosis not present

## 2022-07-06 DIAGNOSIS — E039 Hypothyroidism, unspecified: Secondary | ICD-10-CM | POA: Diagnosis not present

## 2022-07-06 DIAGNOSIS — I10 Essential (primary) hypertension: Secondary | ICD-10-CM | POA: Diagnosis not present

## 2022-07-06 DIAGNOSIS — R2681 Unsteadiness on feet: Secondary | ICD-10-CM | POA: Diagnosis not present

## 2022-07-06 DIAGNOSIS — N390 Urinary tract infection, site not specified: Secondary | ICD-10-CM | POA: Diagnosis not present

## 2022-07-06 DIAGNOSIS — M6281 Muscle weakness (generalized): Secondary | ICD-10-CM | POA: Diagnosis not present

## 2022-07-06 DIAGNOSIS — Z9181 History of falling: Secondary | ICD-10-CM | POA: Diagnosis not present

## 2022-07-09 DIAGNOSIS — Z9181 History of falling: Secondary | ICD-10-CM | POA: Diagnosis not present

## 2022-07-09 DIAGNOSIS — M6281 Muscle weakness (generalized): Secondary | ICD-10-CM | POA: Diagnosis not present

## 2022-07-09 DIAGNOSIS — R2689 Other abnormalities of gait and mobility: Secondary | ICD-10-CM | POA: Diagnosis not present

## 2022-07-09 DIAGNOSIS — R2681 Unsteadiness on feet: Secondary | ICD-10-CM | POA: Diagnosis not present

## 2022-07-09 DIAGNOSIS — N390 Urinary tract infection, site not specified: Secondary | ICD-10-CM | POA: Diagnosis not present

## 2022-07-10 DIAGNOSIS — E039 Hypothyroidism, unspecified: Secondary | ICD-10-CM | POA: Diagnosis not present

## 2022-07-10 DIAGNOSIS — I10 Essential (primary) hypertension: Secondary | ICD-10-CM | POA: Diagnosis not present

## 2022-07-10 DIAGNOSIS — K5900 Constipation, unspecified: Secondary | ICD-10-CM | POA: Diagnosis not present

## 2022-07-13 DIAGNOSIS — A419 Sepsis, unspecified organism: Secondary | ICD-10-CM | POA: Diagnosis not present

## 2022-07-13 DIAGNOSIS — A0811 Acute gastroenteropathy due to Norwalk agent: Secondary | ICD-10-CM | POA: Diagnosis not present

## 2022-07-13 DIAGNOSIS — N390 Urinary tract infection, site not specified: Secondary | ICD-10-CM | POA: Diagnosis not present

## 2022-07-13 DIAGNOSIS — I10 Essential (primary) hypertension: Secondary | ICD-10-CM | POA: Diagnosis not present

## 2022-07-13 DIAGNOSIS — E785 Hyperlipidemia, unspecified: Secondary | ICD-10-CM | POA: Diagnosis not present

## 2022-07-13 DIAGNOSIS — E039 Hypothyroidism, unspecified: Secondary | ICD-10-CM | POA: Diagnosis not present

## 2022-07-13 DIAGNOSIS — B961 Klebsiella pneumoniae [K. pneumoniae] as the cause of diseases classified elsewhere: Secondary | ICD-10-CM | POA: Diagnosis not present

## 2022-07-13 DIAGNOSIS — K50919 Crohn's disease, unspecified, with unspecified complications: Secondary | ICD-10-CM | POA: Diagnosis not present

## 2022-07-13 DIAGNOSIS — G308 Other Alzheimer's disease: Secondary | ICD-10-CM | POA: Diagnosis not present

## 2022-07-14 DIAGNOSIS — E039 Hypothyroidism, unspecified: Secondary | ICD-10-CM | POA: Diagnosis not present

## 2022-07-14 DIAGNOSIS — I10 Essential (primary) hypertension: Secondary | ICD-10-CM | POA: Diagnosis not present

## 2022-07-14 DIAGNOSIS — B961 Klebsiella pneumoniae [K. pneumoniae] as the cause of diseases classified elsewhere: Secondary | ICD-10-CM | POA: Diagnosis not present

## 2022-07-14 DIAGNOSIS — A419 Sepsis, unspecified organism: Secondary | ICD-10-CM | POA: Diagnosis not present

## 2022-07-14 DIAGNOSIS — K50919 Crohn's disease, unspecified, with unspecified complications: Secondary | ICD-10-CM | POA: Diagnosis not present

## 2022-07-14 DIAGNOSIS — A0811 Acute gastroenteropathy due to Norwalk agent: Secondary | ICD-10-CM | POA: Diagnosis not present

## 2022-07-14 DIAGNOSIS — G308 Other Alzheimer's disease: Secondary | ICD-10-CM | POA: Diagnosis not present

## 2022-07-14 DIAGNOSIS — E785 Hyperlipidemia, unspecified: Secondary | ICD-10-CM | POA: Diagnosis not present

## 2022-07-14 DIAGNOSIS — N390 Urinary tract infection, site not specified: Secondary | ICD-10-CM | POA: Diagnosis not present

## 2022-07-15 DIAGNOSIS — A0811 Acute gastroenteropathy due to Norwalk agent: Secondary | ICD-10-CM | POA: Diagnosis not present

## 2022-07-15 DIAGNOSIS — Z1389 Encounter for screening for other disorder: Secondary | ICD-10-CM | POA: Diagnosis not present

## 2022-07-15 DIAGNOSIS — R269 Unspecified abnormalities of gait and mobility: Secondary | ICD-10-CM | POA: Diagnosis not present

## 2022-07-15 DIAGNOSIS — N390 Urinary tract infection, site not specified: Secondary | ICD-10-CM | POA: Diagnosis not present

## 2022-07-15 DIAGNOSIS — G308 Other Alzheimer's disease: Secondary | ICD-10-CM | POA: Diagnosis not present

## 2022-07-15 DIAGNOSIS — B961 Klebsiella pneumoniae [K. pneumoniae] as the cause of diseases classified elsewhere: Secondary | ICD-10-CM | POA: Diagnosis not present

## 2022-07-15 DIAGNOSIS — E039 Hypothyroidism, unspecified: Secondary | ICD-10-CM | POA: Diagnosis not present

## 2022-07-15 DIAGNOSIS — A419 Sepsis, unspecified organism: Secondary | ICD-10-CM | POA: Diagnosis not present

## 2022-07-15 DIAGNOSIS — K50919 Crohn's disease, unspecified, with unspecified complications: Secondary | ICD-10-CM | POA: Diagnosis not present

## 2022-07-15 DIAGNOSIS — I10 Essential (primary) hypertension: Secondary | ICD-10-CM | POA: Diagnosis not present

## 2022-07-15 DIAGNOSIS — G894 Chronic pain syndrome: Secondary | ICD-10-CM | POA: Diagnosis not present

## 2022-07-15 DIAGNOSIS — E785 Hyperlipidemia, unspecified: Secondary | ICD-10-CM | POA: Diagnosis not present

## 2022-07-15 DIAGNOSIS — Z79891 Long term (current) use of opiate analgesic: Secondary | ICD-10-CM | POA: Diagnosis not present

## 2022-07-15 DIAGNOSIS — M5136 Other intervertebral disc degeneration, lumbar region: Secondary | ICD-10-CM | POA: Diagnosis not present

## 2022-07-15 DIAGNOSIS — M47816 Spondylosis without myelopathy or radiculopathy, lumbar region: Secondary | ICD-10-CM | POA: Diagnosis not present

## 2022-07-15 DIAGNOSIS — M549 Dorsalgia, unspecified: Secondary | ICD-10-CM | POA: Diagnosis not present

## 2022-07-16 DIAGNOSIS — I10 Essential (primary) hypertension: Secondary | ICD-10-CM | POA: Diagnosis not present

## 2022-07-16 DIAGNOSIS — I9589 Other hypotension: Secondary | ICD-10-CM | POA: Diagnosis not present

## 2022-07-16 DIAGNOSIS — M1991 Primary osteoarthritis, unspecified site: Secondary | ICD-10-CM | POA: Diagnosis not present

## 2022-07-16 DIAGNOSIS — Z6823 Body mass index (BMI) 23.0-23.9, adult: Secondary | ICD-10-CM | POA: Diagnosis not present

## 2022-07-16 DIAGNOSIS — R0989 Other specified symptoms and signs involving the circulatory and respiratory systems: Secondary | ICD-10-CM | POA: Diagnosis not present

## 2022-07-16 DIAGNOSIS — M7551 Bursitis of right shoulder: Secondary | ICD-10-CM | POA: Diagnosis not present

## 2022-07-16 DIAGNOSIS — N390 Urinary tract infection, site not specified: Secondary | ICD-10-CM | POA: Diagnosis not present

## 2022-07-21 DIAGNOSIS — G308 Other Alzheimer's disease: Secondary | ICD-10-CM | POA: Diagnosis not present

## 2022-07-21 DIAGNOSIS — I10 Essential (primary) hypertension: Secondary | ICD-10-CM | POA: Diagnosis not present

## 2022-07-21 DIAGNOSIS — N390 Urinary tract infection, site not specified: Secondary | ICD-10-CM | POA: Diagnosis not present

## 2022-07-21 DIAGNOSIS — A419 Sepsis, unspecified organism: Secondary | ICD-10-CM | POA: Diagnosis not present

## 2022-07-21 DIAGNOSIS — B961 Klebsiella pneumoniae [K. pneumoniae] as the cause of diseases classified elsewhere: Secondary | ICD-10-CM | POA: Diagnosis not present

## 2022-07-21 DIAGNOSIS — E039 Hypothyroidism, unspecified: Secondary | ICD-10-CM | POA: Diagnosis not present

## 2022-07-21 DIAGNOSIS — A0811 Acute gastroenteropathy due to Norwalk agent: Secondary | ICD-10-CM | POA: Diagnosis not present

## 2022-07-21 DIAGNOSIS — E785 Hyperlipidemia, unspecified: Secondary | ICD-10-CM | POA: Diagnosis not present

## 2022-07-21 DIAGNOSIS — K50919 Crohn's disease, unspecified, with unspecified complications: Secondary | ICD-10-CM | POA: Diagnosis not present

## 2022-07-22 DIAGNOSIS — K50919 Crohn's disease, unspecified, with unspecified complications: Secondary | ICD-10-CM | POA: Diagnosis not present

## 2022-07-22 DIAGNOSIS — Z79899 Other long term (current) drug therapy: Secondary | ICD-10-CM | POA: Diagnosis not present

## 2022-07-22 DIAGNOSIS — N390 Urinary tract infection, site not specified: Secondary | ICD-10-CM | POA: Diagnosis not present

## 2022-07-22 DIAGNOSIS — R339 Retention of urine, unspecified: Secondary | ICD-10-CM | POA: Diagnosis not present

## 2022-07-22 DIAGNOSIS — N400 Enlarged prostate without lower urinary tract symptoms: Secondary | ICD-10-CM | POA: Diagnosis not present

## 2022-07-27 DIAGNOSIS — I10 Essential (primary) hypertension: Secondary | ICD-10-CM | POA: Diagnosis not present

## 2022-07-27 DIAGNOSIS — K50919 Crohn's disease, unspecified, with unspecified complications: Secondary | ICD-10-CM | POA: Diagnosis not present

## 2022-07-27 DIAGNOSIS — E785 Hyperlipidemia, unspecified: Secondary | ICD-10-CM | POA: Diagnosis not present

## 2022-07-27 DIAGNOSIS — G308 Other Alzheimer's disease: Secondary | ICD-10-CM | POA: Diagnosis not present

## 2022-07-27 DIAGNOSIS — A0811 Acute gastroenteropathy due to Norwalk agent: Secondary | ICD-10-CM | POA: Diagnosis not present

## 2022-07-27 DIAGNOSIS — E039 Hypothyroidism, unspecified: Secondary | ICD-10-CM | POA: Diagnosis not present

## 2022-07-27 DIAGNOSIS — B961 Klebsiella pneumoniae [K. pneumoniae] as the cause of diseases classified elsewhere: Secondary | ICD-10-CM | POA: Diagnosis not present

## 2022-07-27 DIAGNOSIS — A419 Sepsis, unspecified organism: Secondary | ICD-10-CM | POA: Diagnosis not present

## 2022-07-27 DIAGNOSIS — N390 Urinary tract infection, site not specified: Secondary | ICD-10-CM | POA: Diagnosis not present

## 2022-07-29 DIAGNOSIS — A0811 Acute gastroenteropathy due to Norwalk agent: Secondary | ICD-10-CM | POA: Diagnosis not present

## 2022-07-29 DIAGNOSIS — E039 Hypothyroidism, unspecified: Secondary | ICD-10-CM | POA: Diagnosis not present

## 2022-07-29 DIAGNOSIS — K50919 Crohn's disease, unspecified, with unspecified complications: Secondary | ICD-10-CM | POA: Diagnosis not present

## 2022-07-29 DIAGNOSIS — G308 Other Alzheimer's disease: Secondary | ICD-10-CM | POA: Diagnosis not present

## 2022-07-29 DIAGNOSIS — B961 Klebsiella pneumoniae [K. pneumoniae] as the cause of diseases classified elsewhere: Secondary | ICD-10-CM | POA: Diagnosis not present

## 2022-07-29 DIAGNOSIS — E785 Hyperlipidemia, unspecified: Secondary | ICD-10-CM | POA: Diagnosis not present

## 2022-07-29 DIAGNOSIS — A419 Sepsis, unspecified organism: Secondary | ICD-10-CM | POA: Diagnosis not present

## 2022-07-29 DIAGNOSIS — N390 Urinary tract infection, site not specified: Secondary | ICD-10-CM | POA: Diagnosis not present

## 2022-07-29 DIAGNOSIS — I10 Essential (primary) hypertension: Secondary | ICD-10-CM | POA: Diagnosis not present

## 2022-07-30 DIAGNOSIS — G308 Other Alzheimer's disease: Secondary | ICD-10-CM | POA: Diagnosis not present

## 2022-07-30 DIAGNOSIS — K50919 Crohn's disease, unspecified, with unspecified complications: Secondary | ICD-10-CM | POA: Diagnosis not present

## 2022-07-30 DIAGNOSIS — A419 Sepsis, unspecified organism: Secondary | ICD-10-CM | POA: Diagnosis not present

## 2022-07-30 DIAGNOSIS — N390 Urinary tract infection, site not specified: Secondary | ICD-10-CM | POA: Diagnosis not present

## 2022-07-30 DIAGNOSIS — A0811 Acute gastroenteropathy due to Norwalk agent: Secondary | ICD-10-CM | POA: Diagnosis not present

## 2022-07-30 DIAGNOSIS — E785 Hyperlipidemia, unspecified: Secondary | ICD-10-CM | POA: Diagnosis not present

## 2022-07-30 DIAGNOSIS — E039 Hypothyroidism, unspecified: Secondary | ICD-10-CM | POA: Diagnosis not present

## 2022-07-30 DIAGNOSIS — B961 Klebsiella pneumoniae [K. pneumoniae] as the cause of diseases classified elsewhere: Secondary | ICD-10-CM | POA: Diagnosis not present

## 2022-07-30 DIAGNOSIS — I10 Essential (primary) hypertension: Secondary | ICD-10-CM | POA: Diagnosis not present

## 2022-08-03 DIAGNOSIS — G308 Other Alzheimer's disease: Secondary | ICD-10-CM | POA: Diagnosis not present

## 2022-08-03 DIAGNOSIS — I10 Essential (primary) hypertension: Secondary | ICD-10-CM | POA: Diagnosis not present

## 2022-08-03 DIAGNOSIS — E785 Hyperlipidemia, unspecified: Secondary | ICD-10-CM | POA: Diagnosis not present

## 2022-08-03 DIAGNOSIS — A0811 Acute gastroenteropathy due to Norwalk agent: Secondary | ICD-10-CM | POA: Diagnosis not present

## 2022-08-03 DIAGNOSIS — A419 Sepsis, unspecified organism: Secondary | ICD-10-CM | POA: Diagnosis not present

## 2022-08-03 DIAGNOSIS — B961 Klebsiella pneumoniae [K. pneumoniae] as the cause of diseases classified elsewhere: Secondary | ICD-10-CM | POA: Diagnosis not present

## 2022-08-03 DIAGNOSIS — N390 Urinary tract infection, site not specified: Secondary | ICD-10-CM | POA: Diagnosis not present

## 2022-08-03 DIAGNOSIS — K50919 Crohn's disease, unspecified, with unspecified complications: Secondary | ICD-10-CM | POA: Diagnosis not present

## 2022-08-03 DIAGNOSIS — E039 Hypothyroidism, unspecified: Secondary | ICD-10-CM | POA: Diagnosis not present

## 2022-08-04 DIAGNOSIS — G308 Other Alzheimer's disease: Secondary | ICD-10-CM | POA: Diagnosis not present

## 2022-08-04 DIAGNOSIS — B961 Klebsiella pneumoniae [K. pneumoniae] as the cause of diseases classified elsewhere: Secondary | ICD-10-CM | POA: Diagnosis not present

## 2022-08-04 DIAGNOSIS — I10 Essential (primary) hypertension: Secondary | ICD-10-CM | POA: Diagnosis not present

## 2022-08-04 DIAGNOSIS — N390 Urinary tract infection, site not specified: Secondary | ICD-10-CM | POA: Diagnosis not present

## 2022-08-04 DIAGNOSIS — K50919 Crohn's disease, unspecified, with unspecified complications: Secondary | ICD-10-CM | POA: Diagnosis not present

## 2022-08-04 DIAGNOSIS — E039 Hypothyroidism, unspecified: Secondary | ICD-10-CM | POA: Diagnosis not present

## 2022-08-04 DIAGNOSIS — A0811 Acute gastroenteropathy due to Norwalk agent: Secondary | ICD-10-CM | POA: Diagnosis not present

## 2022-08-04 DIAGNOSIS — E785 Hyperlipidemia, unspecified: Secondary | ICD-10-CM | POA: Diagnosis not present

## 2022-08-04 DIAGNOSIS — A419 Sepsis, unspecified organism: Secondary | ICD-10-CM | POA: Diagnosis not present

## 2022-08-06 DIAGNOSIS — E785 Hyperlipidemia, unspecified: Secondary | ICD-10-CM | POA: Diagnosis not present

## 2022-08-06 DIAGNOSIS — A419 Sepsis, unspecified organism: Secondary | ICD-10-CM | POA: Diagnosis not present

## 2022-08-06 DIAGNOSIS — I10 Essential (primary) hypertension: Secondary | ICD-10-CM | POA: Diagnosis not present

## 2022-08-06 DIAGNOSIS — A0811 Acute gastroenteropathy due to Norwalk agent: Secondary | ICD-10-CM | POA: Diagnosis not present

## 2022-08-06 DIAGNOSIS — G308 Other Alzheimer's disease: Secondary | ICD-10-CM | POA: Diagnosis not present

## 2022-08-06 DIAGNOSIS — N390 Urinary tract infection, site not specified: Secondary | ICD-10-CM | POA: Diagnosis not present

## 2022-08-06 DIAGNOSIS — K50919 Crohn's disease, unspecified, with unspecified complications: Secondary | ICD-10-CM | POA: Diagnosis not present

## 2022-08-06 DIAGNOSIS — B961 Klebsiella pneumoniae [K. pneumoniae] as the cause of diseases classified elsewhere: Secondary | ICD-10-CM | POA: Diagnosis not present

## 2022-08-06 DIAGNOSIS — E039 Hypothyroidism, unspecified: Secondary | ICD-10-CM | POA: Diagnosis not present

## 2022-08-10 DIAGNOSIS — N390 Urinary tract infection, site not specified: Secondary | ICD-10-CM | POA: Diagnosis not present

## 2022-08-10 DIAGNOSIS — B961 Klebsiella pneumoniae [K. pneumoniae] as the cause of diseases classified elsewhere: Secondary | ICD-10-CM | POA: Diagnosis not present

## 2022-08-10 DIAGNOSIS — K50919 Crohn's disease, unspecified, with unspecified complications: Secondary | ICD-10-CM | POA: Diagnosis not present

## 2022-08-10 DIAGNOSIS — A0811 Acute gastroenteropathy due to Norwalk agent: Secondary | ICD-10-CM | POA: Diagnosis not present

## 2022-08-10 DIAGNOSIS — A419 Sepsis, unspecified organism: Secondary | ICD-10-CM | POA: Diagnosis not present

## 2022-08-10 DIAGNOSIS — E785 Hyperlipidemia, unspecified: Secondary | ICD-10-CM | POA: Diagnosis not present

## 2022-08-10 DIAGNOSIS — E039 Hypothyroidism, unspecified: Secondary | ICD-10-CM | POA: Diagnosis not present

## 2022-08-10 DIAGNOSIS — I10 Essential (primary) hypertension: Secondary | ICD-10-CM | POA: Diagnosis not present

## 2022-08-10 DIAGNOSIS — G308 Other Alzheimer's disease: Secondary | ICD-10-CM | POA: Diagnosis not present

## 2022-08-12 DIAGNOSIS — A419 Sepsis, unspecified organism: Secondary | ICD-10-CM | POA: Diagnosis not present

## 2022-08-12 DIAGNOSIS — I10 Essential (primary) hypertension: Secondary | ICD-10-CM | POA: Diagnosis not present

## 2022-08-12 DIAGNOSIS — G308 Other Alzheimer's disease: Secondary | ICD-10-CM | POA: Diagnosis not present

## 2022-08-12 DIAGNOSIS — E785 Hyperlipidemia, unspecified: Secondary | ICD-10-CM | POA: Diagnosis not present

## 2022-08-12 DIAGNOSIS — K50919 Crohn's disease, unspecified, with unspecified complications: Secondary | ICD-10-CM | POA: Diagnosis not present

## 2022-08-12 DIAGNOSIS — A0811 Acute gastroenteropathy due to Norwalk agent: Secondary | ICD-10-CM | POA: Diagnosis not present

## 2022-08-12 DIAGNOSIS — B961 Klebsiella pneumoniae [K. pneumoniae] as the cause of diseases classified elsewhere: Secondary | ICD-10-CM | POA: Diagnosis not present

## 2022-08-12 DIAGNOSIS — N390 Urinary tract infection, site not specified: Secondary | ICD-10-CM | POA: Diagnosis not present

## 2022-08-12 DIAGNOSIS — E039 Hypothyroidism, unspecified: Secondary | ICD-10-CM | POA: Diagnosis not present

## 2022-08-13 DIAGNOSIS — R269 Unspecified abnormalities of gait and mobility: Secondary | ICD-10-CM | POA: Diagnosis not present

## 2022-08-13 DIAGNOSIS — M47816 Spondylosis without myelopathy or radiculopathy, lumbar region: Secondary | ICD-10-CM | POA: Diagnosis not present

## 2022-08-13 DIAGNOSIS — Z79891 Long term (current) use of opiate analgesic: Secondary | ICD-10-CM | POA: Diagnosis not present

## 2022-08-13 DIAGNOSIS — M549 Dorsalgia, unspecified: Secondary | ICD-10-CM | POA: Diagnosis not present

## 2022-08-13 DIAGNOSIS — M5136 Other intervertebral disc degeneration, lumbar region: Secondary | ICD-10-CM | POA: Diagnosis not present

## 2022-08-13 DIAGNOSIS — G894 Chronic pain syndrome: Secondary | ICD-10-CM | POA: Diagnosis not present

## 2022-08-13 DIAGNOSIS — Z1389 Encounter for screening for other disorder: Secondary | ICD-10-CM | POA: Diagnosis not present

## 2022-08-15 DIAGNOSIS — E039 Hypothyroidism, unspecified: Secondary | ICD-10-CM | POA: Diagnosis not present

## 2022-08-15 DIAGNOSIS — A0811 Acute gastroenteropathy due to Norwalk agent: Secondary | ICD-10-CM | POA: Diagnosis not present

## 2022-08-15 DIAGNOSIS — K50919 Crohn's disease, unspecified, with unspecified complications: Secondary | ICD-10-CM | POA: Diagnosis not present

## 2022-08-15 DIAGNOSIS — I10 Essential (primary) hypertension: Secondary | ICD-10-CM | POA: Diagnosis not present

## 2022-08-15 DIAGNOSIS — B961 Klebsiella pneumoniae [K. pneumoniae] as the cause of diseases classified elsewhere: Secondary | ICD-10-CM | POA: Diagnosis not present

## 2022-08-15 DIAGNOSIS — A419 Sepsis, unspecified organism: Secondary | ICD-10-CM | POA: Diagnosis not present

## 2022-08-15 DIAGNOSIS — N390 Urinary tract infection, site not specified: Secondary | ICD-10-CM | POA: Diagnosis not present

## 2022-08-15 DIAGNOSIS — G308 Other Alzheimer's disease: Secondary | ICD-10-CM | POA: Diagnosis not present

## 2022-08-15 DIAGNOSIS — E785 Hyperlipidemia, unspecified: Secondary | ICD-10-CM | POA: Diagnosis not present

## 2022-08-18 DIAGNOSIS — N390 Urinary tract infection, site not specified: Secondary | ICD-10-CM | POA: Diagnosis not present

## 2022-08-18 DIAGNOSIS — A0811 Acute gastroenteropathy due to Norwalk agent: Secondary | ICD-10-CM | POA: Diagnosis not present

## 2022-08-18 DIAGNOSIS — G308 Other Alzheimer's disease: Secondary | ICD-10-CM | POA: Diagnosis not present

## 2022-08-18 DIAGNOSIS — E039 Hypothyroidism, unspecified: Secondary | ICD-10-CM | POA: Diagnosis not present

## 2022-08-18 DIAGNOSIS — K50919 Crohn's disease, unspecified, with unspecified complications: Secondary | ICD-10-CM | POA: Diagnosis not present

## 2022-08-18 DIAGNOSIS — B961 Klebsiella pneumoniae [K. pneumoniae] as the cause of diseases classified elsewhere: Secondary | ICD-10-CM | POA: Diagnosis not present

## 2022-08-18 DIAGNOSIS — I10 Essential (primary) hypertension: Secondary | ICD-10-CM | POA: Diagnosis not present

## 2022-08-18 DIAGNOSIS — A419 Sepsis, unspecified organism: Secondary | ICD-10-CM | POA: Diagnosis not present

## 2022-08-18 DIAGNOSIS — E785 Hyperlipidemia, unspecified: Secondary | ICD-10-CM | POA: Diagnosis not present

## 2022-08-21 DIAGNOSIS — K50919 Crohn's disease, unspecified, with unspecified complications: Secondary | ICD-10-CM | POA: Diagnosis not present

## 2022-08-21 DIAGNOSIS — B961 Klebsiella pneumoniae [K. pneumoniae] as the cause of diseases classified elsewhere: Secondary | ICD-10-CM | POA: Diagnosis not present

## 2022-08-21 DIAGNOSIS — A0811 Acute gastroenteropathy due to Norwalk agent: Secondary | ICD-10-CM | POA: Diagnosis not present

## 2022-08-21 DIAGNOSIS — A419 Sepsis, unspecified organism: Secondary | ICD-10-CM | POA: Diagnosis not present

## 2022-08-21 DIAGNOSIS — I10 Essential (primary) hypertension: Secondary | ICD-10-CM | POA: Diagnosis not present

## 2022-08-21 DIAGNOSIS — E039 Hypothyroidism, unspecified: Secondary | ICD-10-CM | POA: Diagnosis not present

## 2022-08-21 DIAGNOSIS — N390 Urinary tract infection, site not specified: Secondary | ICD-10-CM | POA: Diagnosis not present

## 2022-08-21 DIAGNOSIS — E785 Hyperlipidemia, unspecified: Secondary | ICD-10-CM | POA: Diagnosis not present

## 2022-08-21 DIAGNOSIS — G308 Other Alzheimer's disease: Secondary | ICD-10-CM | POA: Diagnosis not present

## 2022-08-25 DIAGNOSIS — E785 Hyperlipidemia, unspecified: Secondary | ICD-10-CM | POA: Diagnosis not present

## 2022-08-25 DIAGNOSIS — A0811 Acute gastroenteropathy due to Norwalk agent: Secondary | ICD-10-CM | POA: Diagnosis not present

## 2022-08-25 DIAGNOSIS — I10 Essential (primary) hypertension: Secondary | ICD-10-CM | POA: Diagnosis not present

## 2022-08-25 DIAGNOSIS — B961 Klebsiella pneumoniae [K. pneumoniae] as the cause of diseases classified elsewhere: Secondary | ICD-10-CM | POA: Diagnosis not present

## 2022-08-25 DIAGNOSIS — K50919 Crohn's disease, unspecified, with unspecified complications: Secondary | ICD-10-CM | POA: Diagnosis not present

## 2022-08-25 DIAGNOSIS — E039 Hypothyroidism, unspecified: Secondary | ICD-10-CM | POA: Diagnosis not present

## 2022-08-25 DIAGNOSIS — N390 Urinary tract infection, site not specified: Secondary | ICD-10-CM | POA: Diagnosis not present

## 2022-08-25 DIAGNOSIS — G308 Other Alzheimer's disease: Secondary | ICD-10-CM | POA: Diagnosis not present

## 2022-08-25 DIAGNOSIS — A419 Sepsis, unspecified organism: Secondary | ICD-10-CM | POA: Diagnosis not present

## 2022-08-26 DIAGNOSIS — E785 Hyperlipidemia, unspecified: Secondary | ICD-10-CM | POA: Diagnosis not present

## 2022-08-26 DIAGNOSIS — A0811 Acute gastroenteropathy due to Norwalk agent: Secondary | ICD-10-CM | POA: Diagnosis not present

## 2022-08-26 DIAGNOSIS — B961 Klebsiella pneumoniae [K. pneumoniae] as the cause of diseases classified elsewhere: Secondary | ICD-10-CM | POA: Diagnosis not present

## 2022-08-26 DIAGNOSIS — G308 Other Alzheimer's disease: Secondary | ICD-10-CM | POA: Diagnosis not present

## 2022-08-26 DIAGNOSIS — A419 Sepsis, unspecified organism: Secondary | ICD-10-CM | POA: Diagnosis not present

## 2022-08-26 DIAGNOSIS — E039 Hypothyroidism, unspecified: Secondary | ICD-10-CM | POA: Diagnosis not present

## 2022-08-26 DIAGNOSIS — I10 Essential (primary) hypertension: Secondary | ICD-10-CM | POA: Diagnosis not present

## 2022-08-26 DIAGNOSIS — K50919 Crohn's disease, unspecified, with unspecified complications: Secondary | ICD-10-CM | POA: Diagnosis not present

## 2022-08-26 DIAGNOSIS — N390 Urinary tract infection, site not specified: Secondary | ICD-10-CM | POA: Diagnosis not present

## 2022-09-01 DIAGNOSIS — A419 Sepsis, unspecified organism: Secondary | ICD-10-CM | POA: Diagnosis not present

## 2022-09-01 DIAGNOSIS — A0811 Acute gastroenteropathy due to Norwalk agent: Secondary | ICD-10-CM | POA: Diagnosis not present

## 2022-09-01 DIAGNOSIS — E785 Hyperlipidemia, unspecified: Secondary | ICD-10-CM | POA: Diagnosis not present

## 2022-09-01 DIAGNOSIS — G308 Other Alzheimer's disease: Secondary | ICD-10-CM | POA: Diagnosis not present

## 2022-09-01 DIAGNOSIS — B961 Klebsiella pneumoniae [K. pneumoniae] as the cause of diseases classified elsewhere: Secondary | ICD-10-CM | POA: Diagnosis not present

## 2022-09-01 DIAGNOSIS — E039 Hypothyroidism, unspecified: Secondary | ICD-10-CM | POA: Diagnosis not present

## 2022-09-01 DIAGNOSIS — I10 Essential (primary) hypertension: Secondary | ICD-10-CM | POA: Diagnosis not present

## 2022-09-01 DIAGNOSIS — N390 Urinary tract infection, site not specified: Secondary | ICD-10-CM | POA: Diagnosis not present

## 2022-09-01 DIAGNOSIS — K50919 Crohn's disease, unspecified, with unspecified complications: Secondary | ICD-10-CM | POA: Diagnosis not present

## 2022-09-02 DIAGNOSIS — A0811 Acute gastroenteropathy due to Norwalk agent: Secondary | ICD-10-CM | POA: Diagnosis not present

## 2022-09-02 DIAGNOSIS — E785 Hyperlipidemia, unspecified: Secondary | ICD-10-CM | POA: Diagnosis not present

## 2022-09-02 DIAGNOSIS — G308 Other Alzheimer's disease: Secondary | ICD-10-CM | POA: Diagnosis not present

## 2022-09-02 DIAGNOSIS — I10 Essential (primary) hypertension: Secondary | ICD-10-CM | POA: Diagnosis not present

## 2022-09-02 DIAGNOSIS — A419 Sepsis, unspecified organism: Secondary | ICD-10-CM | POA: Diagnosis not present

## 2022-09-02 DIAGNOSIS — B961 Klebsiella pneumoniae [K. pneumoniae] as the cause of diseases classified elsewhere: Secondary | ICD-10-CM | POA: Diagnosis not present

## 2022-09-02 DIAGNOSIS — E039 Hypothyroidism, unspecified: Secondary | ICD-10-CM | POA: Diagnosis not present

## 2022-09-02 DIAGNOSIS — K50919 Crohn's disease, unspecified, with unspecified complications: Secondary | ICD-10-CM | POA: Diagnosis not present

## 2022-09-02 DIAGNOSIS — N390 Urinary tract infection, site not specified: Secondary | ICD-10-CM | POA: Diagnosis not present

## 2022-09-10 DIAGNOSIS — A419 Sepsis, unspecified organism: Secondary | ICD-10-CM | POA: Diagnosis not present

## 2022-09-10 DIAGNOSIS — E785 Hyperlipidemia, unspecified: Secondary | ICD-10-CM | POA: Diagnosis not present

## 2022-09-10 DIAGNOSIS — I10 Essential (primary) hypertension: Secondary | ICD-10-CM | POA: Diagnosis not present

## 2022-09-10 DIAGNOSIS — A0811 Acute gastroenteropathy due to Norwalk agent: Secondary | ICD-10-CM | POA: Diagnosis not present

## 2022-09-10 DIAGNOSIS — G308 Other Alzheimer's disease: Secondary | ICD-10-CM | POA: Diagnosis not present

## 2022-09-10 DIAGNOSIS — N390 Urinary tract infection, site not specified: Secondary | ICD-10-CM | POA: Diagnosis not present

## 2022-09-10 DIAGNOSIS — E039 Hypothyroidism, unspecified: Secondary | ICD-10-CM | POA: Diagnosis not present

## 2022-09-10 DIAGNOSIS — B961 Klebsiella pneumoniae [K. pneumoniae] as the cause of diseases classified elsewhere: Secondary | ICD-10-CM | POA: Diagnosis not present

## 2022-09-10 DIAGNOSIS — K50919 Crohn's disease, unspecified, with unspecified complications: Secondary | ICD-10-CM | POA: Diagnosis not present

## 2022-09-14 DIAGNOSIS — M47816 Spondylosis without myelopathy or radiculopathy, lumbar region: Secondary | ICD-10-CM | POA: Diagnosis not present

## 2022-09-14 DIAGNOSIS — G894 Chronic pain syndrome: Secondary | ICD-10-CM | POA: Diagnosis not present

## 2022-09-14 DIAGNOSIS — M549 Dorsalgia, unspecified: Secondary | ICD-10-CM | POA: Diagnosis not present

## 2022-09-14 DIAGNOSIS — Z1389 Encounter for screening for other disorder: Secondary | ICD-10-CM | POA: Diagnosis not present

## 2022-09-14 DIAGNOSIS — M5136 Other intervertebral disc degeneration, lumbar region: Secondary | ICD-10-CM | POA: Diagnosis not present

## 2022-09-14 DIAGNOSIS — R269 Unspecified abnormalities of gait and mobility: Secondary | ICD-10-CM | POA: Diagnosis not present

## 2022-09-14 DIAGNOSIS — Z79891 Long term (current) use of opiate analgesic: Secondary | ICD-10-CM | POA: Diagnosis not present

## 2022-09-20 DIAGNOSIS — E785 Hyperlipidemia, unspecified: Secondary | ICD-10-CM | POA: Diagnosis not present

## 2022-09-20 DIAGNOSIS — I25119 Atherosclerotic heart disease of native coronary artery with unspecified angina pectoris: Secondary | ICD-10-CM | POA: Diagnosis not present

## 2022-09-20 DIAGNOSIS — I10 Essential (primary) hypertension: Secondary | ICD-10-CM | POA: Diagnosis not present

## 2022-10-05 DIAGNOSIS — I25119 Atherosclerotic heart disease of native coronary artery with unspecified angina pectoris: Secondary | ICD-10-CM | POA: Diagnosis not present

## 2022-10-05 DIAGNOSIS — I700 Atherosclerosis of aorta: Secondary | ICD-10-CM | POA: Diagnosis not present

## 2022-10-05 DIAGNOSIS — E039 Hypothyroidism, unspecified: Secondary | ICD-10-CM | POA: Diagnosis not present

## 2022-10-05 DIAGNOSIS — Z79899 Other long term (current) drug therapy: Secondary | ICD-10-CM | POA: Diagnosis not present

## 2022-10-05 DIAGNOSIS — N401 Enlarged prostate with lower urinary tract symptoms: Secondary | ICD-10-CM | POA: Diagnosis not present

## 2022-10-05 DIAGNOSIS — D6489 Other specified anemias: Secondary | ICD-10-CM | POA: Diagnosis not present

## 2022-10-05 DIAGNOSIS — R7302 Impaired glucose tolerance (oral): Secondary | ICD-10-CM | POA: Diagnosis not present

## 2022-10-05 DIAGNOSIS — E785 Hyperlipidemia, unspecified: Secondary | ICD-10-CM | POA: Diagnosis not present

## 2022-10-05 DIAGNOSIS — N138 Other obstructive and reflux uropathy: Secondary | ICD-10-CM | POA: Diagnosis not present

## 2022-10-20 DIAGNOSIS — M5136 Other intervertebral disc degeneration, lumbar region: Secondary | ICD-10-CM | POA: Diagnosis not present

## 2022-10-20 DIAGNOSIS — Z79891 Long term (current) use of opiate analgesic: Secondary | ICD-10-CM | POA: Diagnosis not present

## 2022-10-20 DIAGNOSIS — Z1389 Encounter for screening for other disorder: Secondary | ICD-10-CM | POA: Diagnosis not present

## 2022-10-20 DIAGNOSIS — M549 Dorsalgia, unspecified: Secondary | ICD-10-CM | POA: Diagnosis not present

## 2022-10-20 DIAGNOSIS — G894 Chronic pain syndrome: Secondary | ICD-10-CM | POA: Diagnosis not present

## 2022-10-20 DIAGNOSIS — R269 Unspecified abnormalities of gait and mobility: Secondary | ICD-10-CM | POA: Diagnosis not present

## 2022-10-20 DIAGNOSIS — M47816 Spondylosis without myelopathy or radiculopathy, lumbar region: Secondary | ICD-10-CM | POA: Diagnosis not present

## 2022-10-21 DIAGNOSIS — R1084 Generalized abdominal pain: Secondary | ICD-10-CM | POA: Diagnosis not present

## 2022-10-21 DIAGNOSIS — G319 Degenerative disease of nervous system, unspecified: Secondary | ICD-10-CM | POA: Diagnosis not present

## 2022-10-21 DIAGNOSIS — R571 Hypovolemic shock: Secondary | ICD-10-CM | POA: Diagnosis not present

## 2022-10-21 DIAGNOSIS — Z043 Encounter for examination and observation following other accident: Secondary | ICD-10-CM | POA: Diagnosis not present

## 2022-10-21 DIAGNOSIS — R42 Dizziness and giddiness: Secondary | ICD-10-CM | POA: Diagnosis not present

## 2022-10-21 DIAGNOSIS — I2510 Atherosclerotic heart disease of native coronary artery without angina pectoris: Secondary | ICD-10-CM | POA: Diagnosis not present

## 2022-10-21 DIAGNOSIS — K6389 Other specified diseases of intestine: Secondary | ICD-10-CM | POA: Diagnosis not present

## 2022-10-21 DIAGNOSIS — R10817 Generalized abdominal tenderness: Secondary | ICD-10-CM | POA: Diagnosis not present

## 2022-10-21 DIAGNOSIS — E785 Hyperlipidemia, unspecified: Secondary | ICD-10-CM | POA: Diagnosis not present

## 2022-10-21 DIAGNOSIS — E039 Hypothyroidism, unspecified: Secondary | ICD-10-CM | POA: Diagnosis not present

## 2022-10-21 DIAGNOSIS — A050 Foodborne staphylococcal intoxication: Secondary | ICD-10-CM | POA: Diagnosis not present

## 2022-10-21 DIAGNOSIS — R000 Tachycardia, unspecified: Secondary | ICD-10-CM | POA: Diagnosis not present

## 2022-10-21 DIAGNOSIS — I10 Essential (primary) hypertension: Secondary | ICD-10-CM | POA: Diagnosis not present

## 2022-10-21 DIAGNOSIS — N179 Acute kidney failure, unspecified: Secondary | ICD-10-CM | POA: Diagnosis not present

## 2022-10-21 DIAGNOSIS — R197 Diarrhea, unspecified: Secondary | ICD-10-CM | POA: Diagnosis not present

## 2022-10-21 DIAGNOSIS — E860 Dehydration: Secondary | ICD-10-CM | POA: Diagnosis not present

## 2022-10-21 DIAGNOSIS — R55 Syncope and collapse: Secondary | ICD-10-CM | POA: Diagnosis not present

## 2022-10-21 DIAGNOSIS — F32A Depression, unspecified: Secondary | ICD-10-CM | POA: Diagnosis not present

## 2022-10-22 DIAGNOSIS — R571 Hypovolemic shock: Secondary | ICD-10-CM | POA: Diagnosis not present

## 2022-10-23 DIAGNOSIS — R571 Hypovolemic shock: Secondary | ICD-10-CM | POA: Diagnosis not present

## 2022-10-30 DIAGNOSIS — A09 Infectious gastroenteritis and colitis, unspecified: Secondary | ICD-10-CM | POA: Diagnosis not present

## 2022-10-30 DIAGNOSIS — I10 Essential (primary) hypertension: Secondary | ICD-10-CM | POA: Diagnosis not present

## 2022-10-30 DIAGNOSIS — M1991 Primary osteoarthritis, unspecified site: Secondary | ICD-10-CM | POA: Diagnosis not present

## 2022-10-30 DIAGNOSIS — Z6821 Body mass index (BMI) 21.0-21.9, adult: Secondary | ICD-10-CM | POA: Diagnosis not present

## 2022-10-30 DIAGNOSIS — G894 Chronic pain syndrome: Secondary | ICD-10-CM | POA: Diagnosis not present

## 2022-11-02 DIAGNOSIS — M1991 Primary osteoarthritis, unspecified site: Secondary | ICD-10-CM | POA: Diagnosis not present

## 2022-11-09 DIAGNOSIS — Z23 Encounter for immunization: Secondary | ICD-10-CM | POA: Diagnosis not present

## 2022-11-09 DIAGNOSIS — M549 Dorsalgia, unspecified: Secondary | ICD-10-CM | POA: Diagnosis not present

## 2022-11-17 DIAGNOSIS — Z79891 Long term (current) use of opiate analgesic: Secondary | ICD-10-CM | POA: Diagnosis not present

## 2022-11-17 DIAGNOSIS — Z1389 Encounter for screening for other disorder: Secondary | ICD-10-CM | POA: Diagnosis not present

## 2022-11-17 DIAGNOSIS — R269 Unspecified abnormalities of gait and mobility: Secondary | ICD-10-CM | POA: Diagnosis not present

## 2022-11-17 DIAGNOSIS — G894 Chronic pain syndrome: Secondary | ICD-10-CM | POA: Diagnosis not present

## 2022-11-17 DIAGNOSIS — M549 Dorsalgia, unspecified: Secondary | ICD-10-CM | POA: Diagnosis not present

## 2022-11-17 DIAGNOSIS — M47816 Spondylosis without myelopathy or radiculopathy, lumbar region: Secondary | ICD-10-CM | POA: Diagnosis not present

## 2022-11-17 DIAGNOSIS — M5136 Other intervertebral disc degeneration, lumbar region: Secondary | ICD-10-CM | POA: Diagnosis not present

## 2022-11-18 DIAGNOSIS — M4856XA Collapsed vertebra, not elsewhere classified, lumbar region, initial encounter for fracture: Secondary | ICD-10-CM | POA: Diagnosis not present

## 2022-11-18 DIAGNOSIS — M79605 Pain in left leg: Secondary | ICD-10-CM | POA: Diagnosis not present

## 2022-11-18 DIAGNOSIS — M545 Low back pain, unspecified: Secondary | ICD-10-CM | POA: Diagnosis not present

## 2022-11-18 DIAGNOSIS — S32010A Wedge compression fracture of first lumbar vertebra, initial encounter for closed fracture: Secondary | ICD-10-CM | POA: Diagnosis not present

## 2022-11-18 DIAGNOSIS — M47816 Spondylosis without myelopathy or radiculopathy, lumbar region: Secondary | ICD-10-CM | POA: Diagnosis not present

## 2022-11-18 DIAGNOSIS — S22080A Wedge compression fracture of T11-T12 vertebra, initial encounter for closed fracture: Secondary | ICD-10-CM | POA: Diagnosis not present

## 2022-11-30 DIAGNOSIS — Z23 Encounter for immunization: Secondary | ICD-10-CM | POA: Diagnosis not present

## 2022-11-30 DIAGNOSIS — M1991 Primary osteoarthritis, unspecified site: Secondary | ICD-10-CM | POA: Diagnosis not present

## 2022-11-30 DIAGNOSIS — G894 Chronic pain syndrome: Secondary | ICD-10-CM | POA: Diagnosis not present

## 2022-11-30 DIAGNOSIS — Z6822 Body mass index (BMI) 22.0-22.9, adult: Secondary | ICD-10-CM | POA: Diagnosis not present

## 2022-12-23 DIAGNOSIS — R269 Unspecified abnormalities of gait and mobility: Secondary | ICD-10-CM | POA: Diagnosis not present

## 2022-12-23 DIAGNOSIS — Z1389 Encounter for screening for other disorder: Secondary | ICD-10-CM | POA: Diagnosis not present

## 2022-12-23 DIAGNOSIS — M549 Dorsalgia, unspecified: Secondary | ICD-10-CM | POA: Diagnosis not present

## 2022-12-23 DIAGNOSIS — Z79891 Long term (current) use of opiate analgesic: Secondary | ICD-10-CM | POA: Diagnosis not present

## 2022-12-23 DIAGNOSIS — M47816 Spondylosis without myelopathy or radiculopathy, lumbar region: Secondary | ICD-10-CM | POA: Diagnosis not present

## 2022-12-23 DIAGNOSIS — G894 Chronic pain syndrome: Secondary | ICD-10-CM | POA: Diagnosis not present

## 2023-01-06 DIAGNOSIS — Z6823 Body mass index (BMI) 23.0-23.9, adult: Secondary | ICD-10-CM | POA: Diagnosis not present

## 2023-01-06 DIAGNOSIS — M1991 Primary osteoarthritis, unspecified site: Secondary | ICD-10-CM | POA: Diagnosis not present

## 2023-01-06 DIAGNOSIS — G894 Chronic pain syndrome: Secondary | ICD-10-CM | POA: Diagnosis not present

## 2023-01-06 DIAGNOSIS — M19011 Primary osteoarthritis, right shoulder: Secondary | ICD-10-CM | POA: Diagnosis not present

## 2023-01-06 DIAGNOSIS — I10 Essential (primary) hypertension: Secondary | ICD-10-CM | POA: Diagnosis not present

## 2023-01-19 DIAGNOSIS — M12811 Other specific arthropathies, not elsewhere classified, right shoulder: Secondary | ICD-10-CM | POA: Diagnosis not present

## 2023-01-19 DIAGNOSIS — M75101 Unspecified rotator cuff tear or rupture of right shoulder, not specified as traumatic: Secondary | ICD-10-CM | POA: Diagnosis not present

## 2023-01-26 DIAGNOSIS — Z87891 Personal history of nicotine dependence: Secondary | ICD-10-CM | POA: Diagnosis not present

## 2023-01-26 DIAGNOSIS — E782 Mixed hyperlipidemia: Secondary | ICD-10-CM | POA: Diagnosis not present

## 2023-01-26 DIAGNOSIS — I10 Essential (primary) hypertension: Secondary | ICD-10-CM | POA: Diagnosis not present

## 2023-01-26 DIAGNOSIS — I2510 Atherosclerotic heart disease of native coronary artery without angina pectoris: Secondary | ICD-10-CM | POA: Diagnosis not present

## 2023-01-26 DIAGNOSIS — R0609 Other forms of dyspnea: Secondary | ICD-10-CM | POA: Diagnosis not present

## 2023-01-26 DIAGNOSIS — Z7982 Long term (current) use of aspirin: Secondary | ICD-10-CM | POA: Diagnosis not present

## 2023-01-26 DIAGNOSIS — Z955 Presence of coronary angioplasty implant and graft: Secondary | ICD-10-CM | POA: Diagnosis not present

## 2023-01-26 DIAGNOSIS — E785 Hyperlipidemia, unspecified: Secondary | ICD-10-CM | POA: Diagnosis not present

## 2023-01-28 DIAGNOSIS — R269 Unspecified abnormalities of gait and mobility: Secondary | ICD-10-CM | POA: Diagnosis not present

## 2023-01-28 DIAGNOSIS — Z79891 Long term (current) use of opiate analgesic: Secondary | ICD-10-CM | POA: Diagnosis not present

## 2023-01-28 DIAGNOSIS — G894 Chronic pain syndrome: Secondary | ICD-10-CM | POA: Diagnosis not present

## 2023-01-28 DIAGNOSIS — Z1389 Encounter for screening for other disorder: Secondary | ICD-10-CM | POA: Diagnosis not present

## 2023-01-28 DIAGNOSIS — M549 Dorsalgia, unspecified: Secondary | ICD-10-CM | POA: Diagnosis not present

## 2023-01-28 DIAGNOSIS — M47816 Spondylosis without myelopathy or radiculopathy, lumbar region: Secondary | ICD-10-CM | POA: Diagnosis not present

## 2023-02-11 DIAGNOSIS — M75101 Unspecified rotator cuff tear or rupture of right shoulder, not specified as traumatic: Secondary | ICD-10-CM | POA: Diagnosis not present

## 2023-02-11 DIAGNOSIS — S46811A Strain of other muscles, fascia and tendons at shoulder and upper arm level, right arm, initial encounter: Secondary | ICD-10-CM | POA: Diagnosis not present

## 2023-02-11 DIAGNOSIS — X58XXXA Exposure to other specified factors, initial encounter: Secondary | ICD-10-CM | POA: Diagnosis not present

## 2023-02-17 DIAGNOSIS — M75121 Complete rotator cuff tear or rupture of right shoulder, not specified as traumatic: Secondary | ICD-10-CM | POA: Diagnosis not present

## 2023-02-23 DIAGNOSIS — M12811 Other specific arthropathies, not elsewhere classified, right shoulder: Secondary | ICD-10-CM | POA: Diagnosis not present

## 2023-02-23 DIAGNOSIS — M75101 Unspecified rotator cuff tear or rupture of right shoulder, not specified as traumatic: Secondary | ICD-10-CM | POA: Diagnosis not present

## 2023-02-25 DIAGNOSIS — Z79899 Other long term (current) drug therapy: Secondary | ICD-10-CM | POA: Diagnosis not present

## 2023-02-25 DIAGNOSIS — M79609 Pain in unspecified limb: Secondary | ICD-10-CM | POA: Diagnosis not present

## 2023-02-25 DIAGNOSIS — Z01818 Encounter for other preprocedural examination: Secondary | ICD-10-CM | POA: Diagnosis not present

## 2023-02-25 DIAGNOSIS — E559 Vitamin D deficiency, unspecified: Secondary | ICD-10-CM | POA: Diagnosis not present

## 2023-02-26 DIAGNOSIS — R269 Unspecified abnormalities of gait and mobility: Secondary | ICD-10-CM | POA: Diagnosis not present

## 2023-02-26 DIAGNOSIS — Z1389 Encounter for screening for other disorder: Secondary | ICD-10-CM | POA: Diagnosis not present

## 2023-02-26 DIAGNOSIS — M549 Dorsalgia, unspecified: Secondary | ICD-10-CM | POA: Diagnosis not present

## 2023-02-26 DIAGNOSIS — G894 Chronic pain syndrome: Secondary | ICD-10-CM | POA: Diagnosis not present

## 2023-02-26 DIAGNOSIS — Z79891 Long term (current) use of opiate analgesic: Secondary | ICD-10-CM | POA: Diagnosis not present

## 2023-02-26 DIAGNOSIS — M47816 Spondylosis without myelopathy or radiculopathy, lumbar region: Secondary | ICD-10-CM | POA: Diagnosis not present

## 2023-03-05 DIAGNOSIS — I351 Nonrheumatic aortic (valve) insufficiency: Secondary | ICD-10-CM | POA: Diagnosis not present

## 2023-03-05 DIAGNOSIS — I358 Other nonrheumatic aortic valve disorders: Secondary | ICD-10-CM | POA: Diagnosis not present

## 2023-03-11 DIAGNOSIS — G250 Essential tremor: Secondary | ICD-10-CM | POA: Diagnosis not present

## 2023-03-11 DIAGNOSIS — M75101 Unspecified rotator cuff tear or rupture of right shoulder, not specified as traumatic: Secondary | ICD-10-CM | POA: Diagnosis not present

## 2023-03-11 DIAGNOSIS — E039 Hypothyroidism, unspecified: Secondary | ICD-10-CM | POA: Diagnosis not present

## 2023-03-11 DIAGNOSIS — E785 Hyperlipidemia, unspecified: Secondary | ICD-10-CM | POA: Diagnosis not present

## 2023-03-11 DIAGNOSIS — G8918 Other acute postprocedural pain: Secondary | ICD-10-CM | POA: Diagnosis not present

## 2023-03-11 DIAGNOSIS — I2510 Atherosclerotic heart disease of native coronary artery without angina pectoris: Secondary | ICD-10-CM | POA: Diagnosis not present

## 2023-03-11 DIAGNOSIS — Z96611 Presence of right artificial shoulder joint: Secondary | ICD-10-CM | POA: Diagnosis not present

## 2023-03-11 DIAGNOSIS — M19011 Primary osteoarthritis, right shoulder: Secondary | ICD-10-CM | POA: Diagnosis not present

## 2023-03-11 DIAGNOSIS — Z471 Aftercare following joint replacement surgery: Secondary | ICD-10-CM | POA: Diagnosis not present

## 2023-03-11 DIAGNOSIS — M12811 Other specific arthropathies, not elsewhere classified, right shoulder: Secondary | ICD-10-CM | POA: Diagnosis not present

## 2023-03-11 DIAGNOSIS — I10 Essential (primary) hypertension: Secondary | ICD-10-CM | POA: Diagnosis not present

## 2023-03-11 DIAGNOSIS — K219 Gastro-esophageal reflux disease without esophagitis: Secondary | ICD-10-CM | POA: Diagnosis not present

## 2023-03-11 DIAGNOSIS — F419 Anxiety disorder, unspecified: Secondary | ICD-10-CM | POA: Diagnosis not present

## 2023-03-12 DIAGNOSIS — I2510 Atherosclerotic heart disease of native coronary artery without angina pectoris: Secondary | ICD-10-CM | POA: Diagnosis not present

## 2023-03-12 DIAGNOSIS — K219 Gastro-esophageal reflux disease without esophagitis: Secondary | ICD-10-CM | POA: Diagnosis not present

## 2023-03-12 DIAGNOSIS — E785 Hyperlipidemia, unspecified: Secondary | ICD-10-CM | POA: Diagnosis not present

## 2023-03-12 DIAGNOSIS — F419 Anxiety disorder, unspecified: Secondary | ICD-10-CM | POA: Diagnosis not present

## 2023-03-12 DIAGNOSIS — M75101 Unspecified rotator cuff tear or rupture of right shoulder, not specified as traumatic: Secondary | ICD-10-CM | POA: Diagnosis not present

## 2023-03-12 DIAGNOSIS — G250 Essential tremor: Secondary | ICD-10-CM | POA: Diagnosis not present

## 2023-03-12 DIAGNOSIS — E039 Hypothyroidism, unspecified: Secondary | ICD-10-CM | POA: Diagnosis not present

## 2023-03-12 DIAGNOSIS — M12811 Other specific arthropathies, not elsewhere classified, right shoulder: Secondary | ICD-10-CM | POA: Diagnosis not present

## 2023-03-12 DIAGNOSIS — Z96611 Presence of right artificial shoulder joint: Secondary | ICD-10-CM | POA: Diagnosis not present

## 2023-03-12 DIAGNOSIS — I10 Essential (primary) hypertension: Secondary | ICD-10-CM | POA: Diagnosis not present

## 2023-03-18 DIAGNOSIS — M25611 Stiffness of right shoulder, not elsewhere classified: Secondary | ICD-10-CM | POA: Diagnosis not present

## 2023-03-18 DIAGNOSIS — M25511 Pain in right shoulder: Secondary | ICD-10-CM | POA: Diagnosis not present

## 2023-03-26 DIAGNOSIS — W182XXA Fall in (into) shower or empty bathtub, initial encounter: Secondary | ICD-10-CM | POA: Diagnosis not present

## 2023-03-26 DIAGNOSIS — M1711 Unilateral primary osteoarthritis, right knee: Secondary | ICD-10-CM | POA: Diagnosis not present

## 2023-03-29 DIAGNOSIS — M25511 Pain in right shoulder: Secondary | ICD-10-CM | POA: Diagnosis not present

## 2023-03-29 DIAGNOSIS — M25611 Stiffness of right shoulder, not elsewhere classified: Secondary | ICD-10-CM | POA: Diagnosis not present

## 2023-04-01 DIAGNOSIS — M25611 Stiffness of right shoulder, not elsewhere classified: Secondary | ICD-10-CM | POA: Diagnosis not present

## 2023-04-01 DIAGNOSIS — M25511 Pain in right shoulder: Secondary | ICD-10-CM | POA: Diagnosis not present

## 2023-04-05 DIAGNOSIS — Z1389 Encounter for screening for other disorder: Secondary | ICD-10-CM | POA: Diagnosis not present

## 2023-04-05 DIAGNOSIS — G894 Chronic pain syndrome: Secondary | ICD-10-CM | POA: Diagnosis not present

## 2023-04-05 DIAGNOSIS — M25611 Stiffness of right shoulder, not elsewhere classified: Secondary | ICD-10-CM | POA: Diagnosis not present

## 2023-04-05 DIAGNOSIS — M549 Dorsalgia, unspecified: Secondary | ICD-10-CM | POA: Diagnosis not present

## 2023-04-05 DIAGNOSIS — R269 Unspecified abnormalities of gait and mobility: Secondary | ICD-10-CM | POA: Diagnosis not present

## 2023-04-05 DIAGNOSIS — M25511 Pain in right shoulder: Secondary | ICD-10-CM | POA: Diagnosis not present

## 2023-04-05 DIAGNOSIS — M47816 Spondylosis without myelopathy or radiculopathy, lumbar region: Secondary | ICD-10-CM | POA: Diagnosis not present

## 2023-04-05 DIAGNOSIS — Z79891 Long term (current) use of opiate analgesic: Secondary | ICD-10-CM | POA: Diagnosis not present

## 2023-04-08 DIAGNOSIS — M25511 Pain in right shoulder: Secondary | ICD-10-CM | POA: Diagnosis not present

## 2023-04-08 DIAGNOSIS — M25611 Stiffness of right shoulder, not elsewhere classified: Secondary | ICD-10-CM | POA: Diagnosis not present

## 2023-04-13 DIAGNOSIS — M25511 Pain in right shoulder: Secondary | ICD-10-CM | POA: Diagnosis not present

## 2023-04-13 DIAGNOSIS — M25611 Stiffness of right shoulder, not elsewhere classified: Secondary | ICD-10-CM | POA: Diagnosis not present

## 2023-04-13 DIAGNOSIS — M1711 Unilateral primary osteoarthritis, right knee: Secondary | ICD-10-CM | POA: Diagnosis not present

## 2023-04-15 DIAGNOSIS — M25611 Stiffness of right shoulder, not elsewhere classified: Secondary | ICD-10-CM | POA: Diagnosis not present

## 2023-04-15 DIAGNOSIS — M25511 Pain in right shoulder: Secondary | ICD-10-CM | POA: Diagnosis not present

## 2023-04-20 DIAGNOSIS — M25611 Stiffness of right shoulder, not elsewhere classified: Secondary | ICD-10-CM | POA: Diagnosis not present

## 2023-04-20 DIAGNOSIS — M25511 Pain in right shoulder: Secondary | ICD-10-CM | POA: Diagnosis not present

## 2023-04-21 DIAGNOSIS — F332 Major depressive disorder, recurrent severe without psychotic features: Secondary | ICD-10-CM | POA: Diagnosis not present

## 2023-04-22 DIAGNOSIS — M25611 Stiffness of right shoulder, not elsewhere classified: Secondary | ICD-10-CM | POA: Diagnosis not present

## 2023-04-22 DIAGNOSIS — M25511 Pain in right shoulder: Secondary | ICD-10-CM | POA: Diagnosis not present

## 2023-04-27 DIAGNOSIS — K5641 Fecal impaction: Secondary | ICD-10-CM | POA: Diagnosis not present

## 2023-04-27 DIAGNOSIS — E860 Dehydration: Secondary | ICD-10-CM | POA: Diagnosis not present

## 2023-04-27 DIAGNOSIS — I10 Essential (primary) hypertension: Secondary | ICD-10-CM | POA: Diagnosis not present

## 2023-04-27 DIAGNOSIS — K5900 Constipation, unspecified: Secondary | ICD-10-CM | POA: Diagnosis not present

## 2023-05-03 DIAGNOSIS — M75101 Unspecified rotator cuff tear or rupture of right shoulder, not specified as traumatic: Secondary | ICD-10-CM | POA: Diagnosis not present

## 2023-05-03 DIAGNOSIS — M12811 Other specific arthropathies, not elsewhere classified, right shoulder: Secondary | ICD-10-CM | POA: Diagnosis not present

## 2023-05-04 DIAGNOSIS — M1991 Primary osteoarthritis, unspecified site: Secondary | ICD-10-CM | POA: Diagnosis not present

## 2023-05-04 DIAGNOSIS — I700 Atherosclerosis of aorta: Secondary | ICD-10-CM | POA: Diagnosis not present

## 2023-05-04 DIAGNOSIS — I10 Essential (primary) hypertension: Secondary | ICD-10-CM | POA: Diagnosis not present

## 2023-05-04 DIAGNOSIS — E039 Hypothyroidism, unspecified: Secondary | ICD-10-CM | POA: Diagnosis not present

## 2023-05-04 DIAGNOSIS — Z79891 Long term (current) use of opiate analgesic: Secondary | ICD-10-CM | POA: Diagnosis not present

## 2023-05-04 DIAGNOSIS — F411 Generalized anxiety disorder: Secondary | ICD-10-CM | POA: Diagnosis not present

## 2023-05-04 DIAGNOSIS — G894 Chronic pain syndrome: Secondary | ICD-10-CM | POA: Diagnosis not present

## 2023-05-04 DIAGNOSIS — T40605A Adverse effect of unspecified narcotics, initial encounter: Secondary | ICD-10-CM | POA: Diagnosis not present

## 2023-05-04 DIAGNOSIS — M47816 Spondylosis without myelopathy or radiculopathy, lumbar region: Secondary | ICD-10-CM | POA: Diagnosis not present

## 2023-05-04 DIAGNOSIS — Z1389 Encounter for screening for other disorder: Secondary | ICD-10-CM | POA: Diagnosis not present

## 2023-05-04 DIAGNOSIS — K6389 Other specified diseases of intestine: Secondary | ICD-10-CM | POA: Diagnosis not present

## 2023-05-04 DIAGNOSIS — I25119 Atherosclerotic heart disease of native coronary artery with unspecified angina pectoris: Secondary | ICD-10-CM | POA: Diagnosis not present

## 2023-05-04 DIAGNOSIS — M549 Dorsalgia, unspecified: Secondary | ICD-10-CM | POA: Diagnosis not present

## 2023-05-04 DIAGNOSIS — R269 Unspecified abnormalities of gait and mobility: Secondary | ICD-10-CM | POA: Diagnosis not present

## 2023-05-05 DIAGNOSIS — M25611 Stiffness of right shoulder, not elsewhere classified: Secondary | ICD-10-CM | POA: Diagnosis not present

## 2023-05-05 DIAGNOSIS — M25511 Pain in right shoulder: Secondary | ICD-10-CM | POA: Diagnosis not present

## 2023-05-07 DIAGNOSIS — M25511 Pain in right shoulder: Secondary | ICD-10-CM | POA: Diagnosis not present

## 2023-05-07 DIAGNOSIS — M25611 Stiffness of right shoulder, not elsewhere classified: Secondary | ICD-10-CM | POA: Diagnosis not present

## 2023-05-11 DIAGNOSIS — M25611 Stiffness of right shoulder, not elsewhere classified: Secondary | ICD-10-CM | POA: Diagnosis not present

## 2023-05-11 DIAGNOSIS — M25511 Pain in right shoulder: Secondary | ICD-10-CM | POA: Diagnosis not present

## 2023-05-19 DIAGNOSIS — M25611 Stiffness of right shoulder, not elsewhere classified: Secondary | ICD-10-CM | POA: Diagnosis not present

## 2023-05-19 DIAGNOSIS — M25511 Pain in right shoulder: Secondary | ICD-10-CM | POA: Diagnosis not present

## 2023-05-21 DIAGNOSIS — M25611 Stiffness of right shoulder, not elsewhere classified: Secondary | ICD-10-CM | POA: Diagnosis not present

## 2023-05-21 DIAGNOSIS — M25511 Pain in right shoulder: Secondary | ICD-10-CM | POA: Diagnosis not present

## 2023-05-25 DIAGNOSIS — M25611 Stiffness of right shoulder, not elsewhere classified: Secondary | ICD-10-CM | POA: Diagnosis not present

## 2023-05-25 DIAGNOSIS — M25511 Pain in right shoulder: Secondary | ICD-10-CM | POA: Diagnosis not present

## 2023-05-28 DIAGNOSIS — M25611 Stiffness of right shoulder, not elsewhere classified: Secondary | ICD-10-CM | POA: Diagnosis not present

## 2023-05-28 DIAGNOSIS — M25511 Pain in right shoulder: Secondary | ICD-10-CM | POA: Diagnosis not present

## 2023-06-01 DIAGNOSIS — M25611 Stiffness of right shoulder, not elsewhere classified: Secondary | ICD-10-CM | POA: Diagnosis not present

## 2023-06-01 DIAGNOSIS — M25511 Pain in right shoulder: Secondary | ICD-10-CM | POA: Diagnosis not present

## 2023-06-04 DIAGNOSIS — M75101 Unspecified rotator cuff tear or rupture of right shoulder, not specified as traumatic: Secondary | ICD-10-CM | POA: Diagnosis not present

## 2023-06-04 DIAGNOSIS — Z96611 Presence of right artificial shoulder joint: Secondary | ICD-10-CM | POA: Diagnosis not present

## 2023-06-04 DIAGNOSIS — M12811 Other specific arthropathies, not elsewhere classified, right shoulder: Secondary | ICD-10-CM | POA: Diagnosis not present

## 2023-06-08 DIAGNOSIS — M25611 Stiffness of right shoulder, not elsewhere classified: Secondary | ICD-10-CM | POA: Diagnosis not present

## 2023-06-08 DIAGNOSIS — M25511 Pain in right shoulder: Secondary | ICD-10-CM | POA: Diagnosis not present

## 2023-06-15 DIAGNOSIS — M25511 Pain in right shoulder: Secondary | ICD-10-CM | POA: Diagnosis not present

## 2023-06-15 DIAGNOSIS — M25611 Stiffness of right shoulder, not elsewhere classified: Secondary | ICD-10-CM | POA: Diagnosis not present

## 2023-06-22 DIAGNOSIS — M25611 Stiffness of right shoulder, not elsewhere classified: Secondary | ICD-10-CM | POA: Diagnosis not present

## 2023-06-22 DIAGNOSIS — M25511 Pain in right shoulder: Secondary | ICD-10-CM | POA: Diagnosis not present

## 2023-06-30 DIAGNOSIS — Z96611 Presence of right artificial shoulder joint: Secondary | ICD-10-CM | POA: Diagnosis not present

## 2023-06-30 DIAGNOSIS — M75101 Unspecified rotator cuff tear or rupture of right shoulder, not specified as traumatic: Secondary | ICD-10-CM | POA: Diagnosis not present

## 2023-06-30 DIAGNOSIS — M12811 Other specific arthropathies, not elsewhere classified, right shoulder: Secondary | ICD-10-CM | POA: Diagnosis not present

## 2023-07-12 DIAGNOSIS — M1711 Unilateral primary osteoarthritis, right knee: Secondary | ICD-10-CM | POA: Diagnosis not present

## 2023-07-20 DIAGNOSIS — F332 Major depressive disorder, recurrent severe without psychotic features: Secondary | ICD-10-CM | POA: Diagnosis not present

## 2023-07-27 DIAGNOSIS — Z87891 Personal history of nicotine dependence: Secondary | ICD-10-CM | POA: Diagnosis not present

## 2023-07-27 DIAGNOSIS — I2510 Atherosclerotic heart disease of native coronary artery without angina pectoris: Secondary | ICD-10-CM | POA: Diagnosis not present

## 2023-07-27 DIAGNOSIS — I10 Essential (primary) hypertension: Secondary | ICD-10-CM | POA: Diagnosis not present

## 2023-07-27 DIAGNOSIS — Z7982 Long term (current) use of aspirin: Secondary | ICD-10-CM | POA: Diagnosis not present

## 2023-07-27 DIAGNOSIS — E782 Mixed hyperlipidemia: Secondary | ICD-10-CM | POA: Diagnosis not present

## 2023-07-27 DIAGNOSIS — Z955 Presence of coronary angioplasty implant and graft: Secondary | ICD-10-CM | POA: Diagnosis not present

## 2023-08-23 IMAGING — CT CT HEAD W/O CM
3 series · 14 of 47 positions shown, 16 images · non-contrast
Comparison: CT angio head neck 11/18/2018.

CLINICAL DATA: Neck trauma. Headache, intracranial hemorrhage
suspected. Status post fall [DATE] times every day for 2 weeks.

EXAM:
CT HEAD WITHOUT CONTRAST
CT CERVICAL SPINE WITHOUT CONTRAST
TECHNIQUE: Multidetector CT imaging of the head and cervical spine was
performed following the standard protocol without intravenous
contrast. Multiplanar CT image reconstructions of the cervical spine
were also generated.

[Series 3: head 5.0 h30s · axial · 0.41mm/px · z∈[-177,-42]mm · 8 of 33 slices shown, 10 images]
[im 3/33  brain]
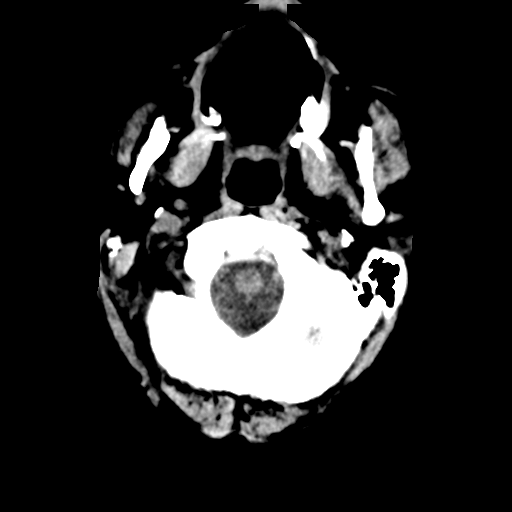
[im 3/33  bone]
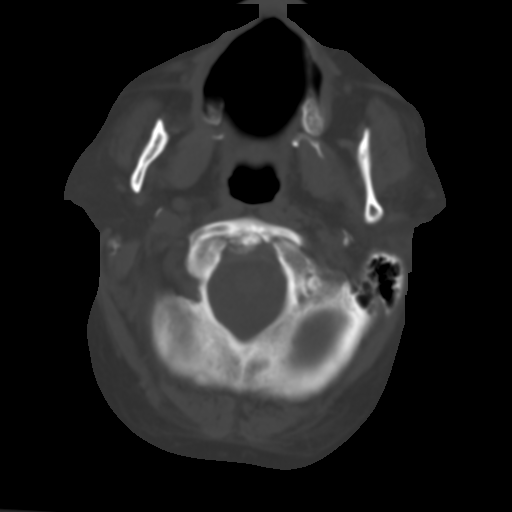
[im 7/33  brain]
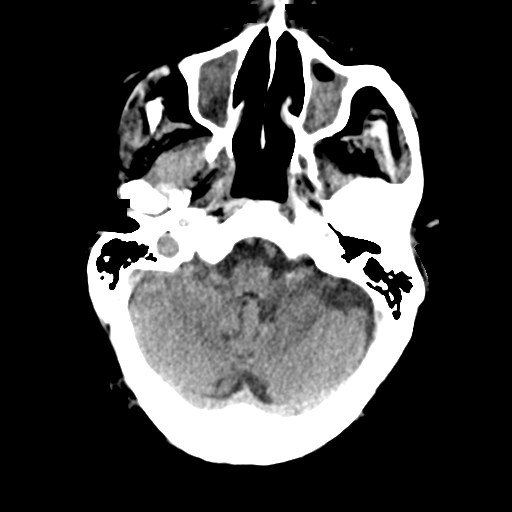
[im 10/33  brain]
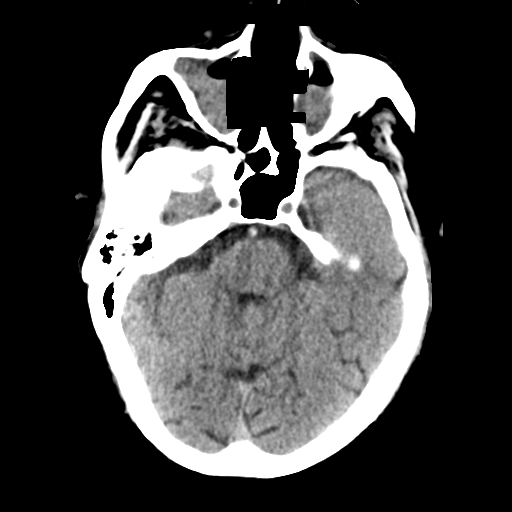
[im 15/33  brain]
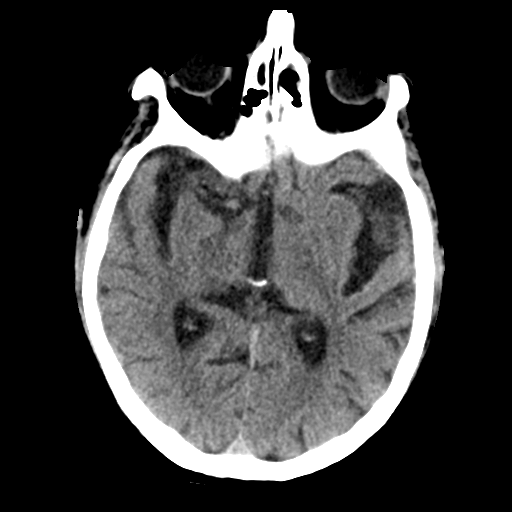
[im 18/33  brain]
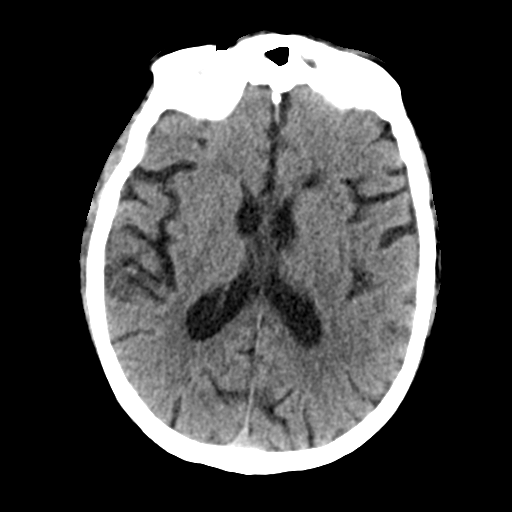
[im 18/33  bone]
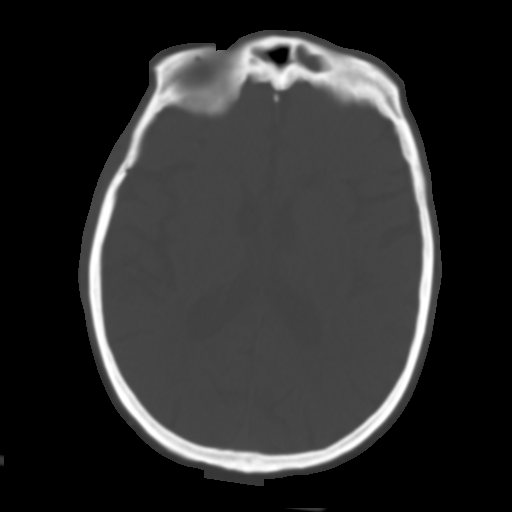
[im 23/33  brain]
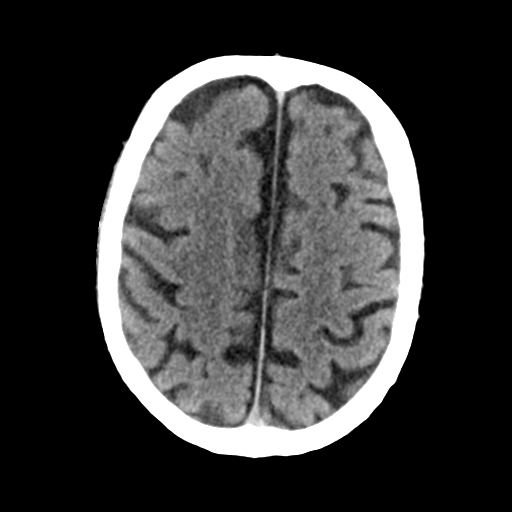
[im 26/33  brain]
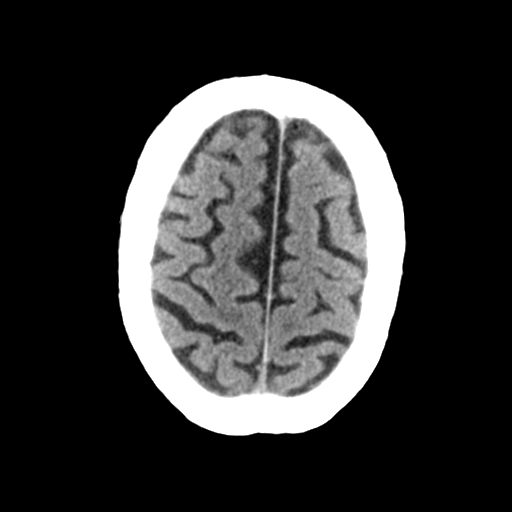
[im 30/33  brain]
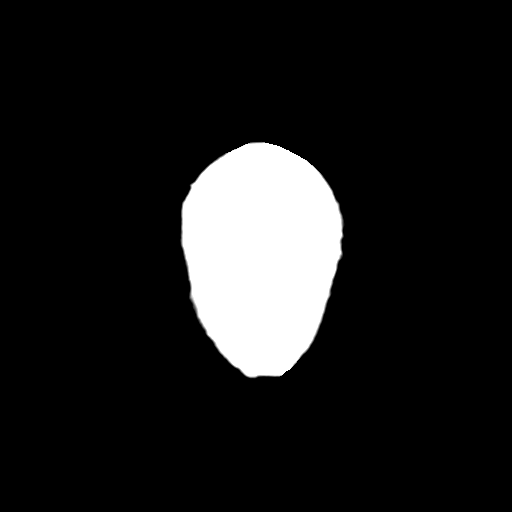

[Series 5: head 3.0 mpr cor · coronal · 0.27mm/px · 3 of 72 slices shown]
[im 27/72  brain]
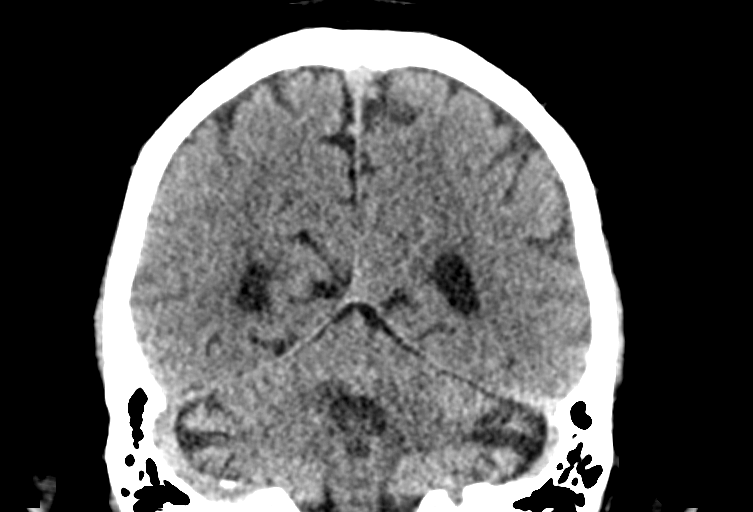
[im 33/72  brain]
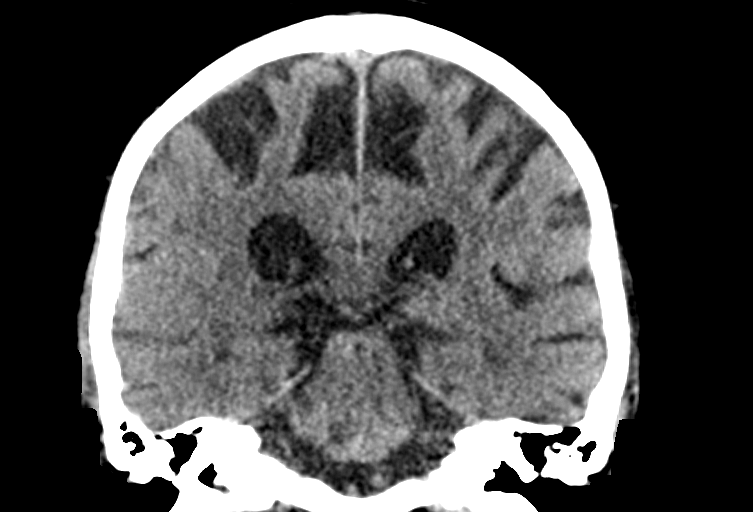
[im 39/72  brain]
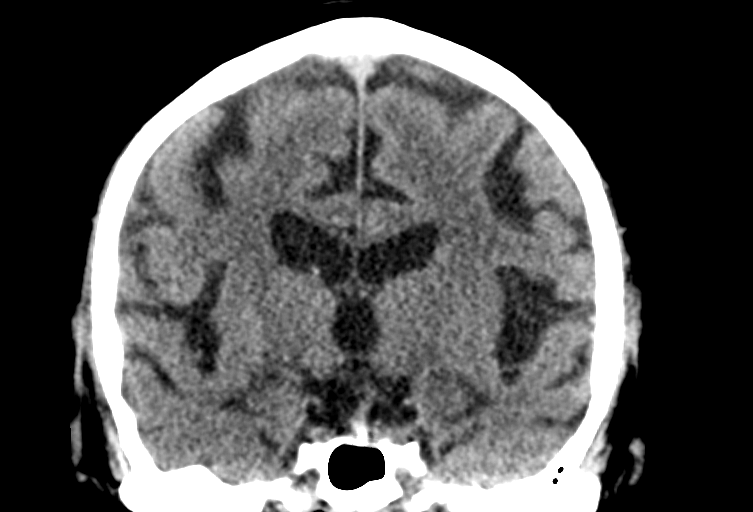

[Series 6: head 3.0 mpr sag · sagittal · 0.29mm/px · 3 of 67 slices shown]
[im 23/67  brain]
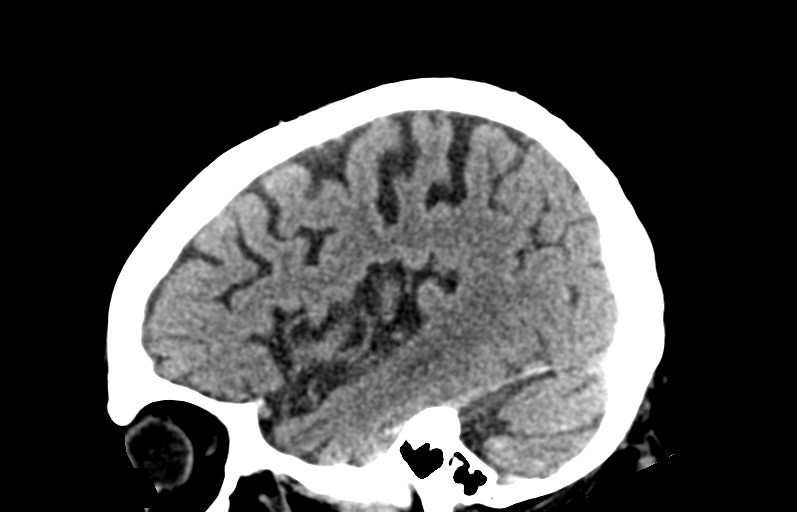
[im 34/67  brain]
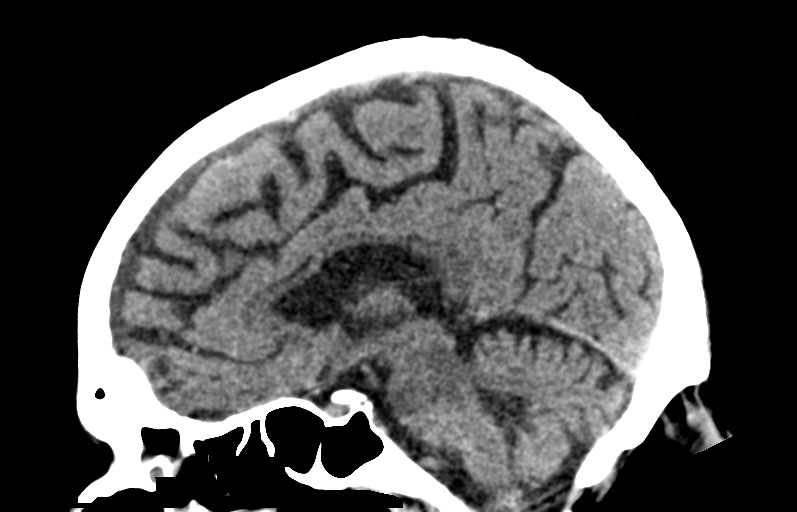
[im 45/67  brain]
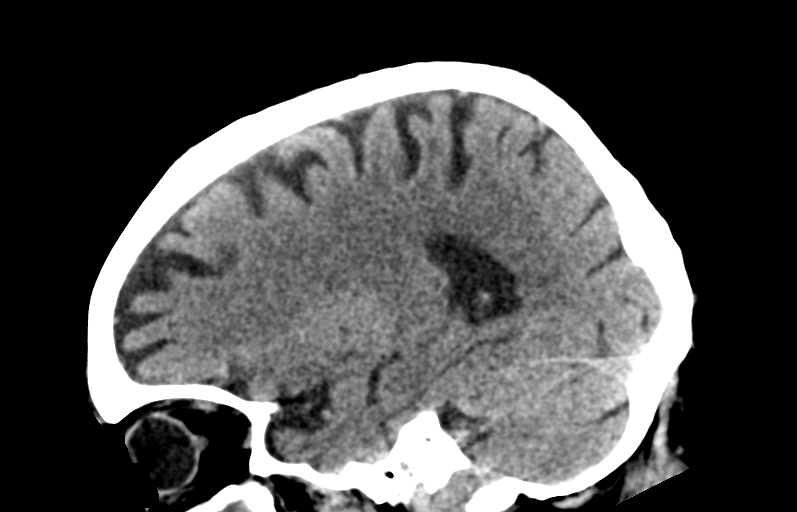

[14 of 47 positions shown; findings below may reference images not displayed]

FINDINGS: CT HEAD FINDINGS

BRAIN:
BRAIN
Cerebral ventricle sizes are concordant with the degree of cerebral
volume loss. Patchy and confluent areas of decreased attenuation are
noted throughout the deep and periventricular white matter of the
cerebral hemispheres bilaterally, compatible with chronic
microvascular ischemic disease.

No evidence of large-territorial acute infarction. No parenchymal
hemorrhage. No mass lesion. No extra-axial collection.

No mass effect or midline shift. No hydrocephalus. Basilar cisterns
are patent.

Vascular: No hyperdense vessel.

Skull: No acute fracture or focal lesion. Atherosclerotic
calcifications are present within the cavernous internal carotid
arteries.

Sinuses/Orbits: Almost complete opacification of bilateral maxillary
sinuses. Associated maxillary wall mild hyperostosis. Remaining
visualized paranasal sinuses and mastoid air cells are clear.
Bilateral lens replacement. Otherwise orbits are unremarkable.

Other: None.

CT CERVICAL SPINE FINDINGS

Alignment: Normal.

Skull base and vertebrae: Multilevel degenerative changes of the
spine. Associated severe osseous neural foraminal stenosis at the
right C5-C6 level. As well as left C6-C7 level. No severe osseous
central canal stenosis. No acute fracture. No aggressive appearing
focal osseous lesion or focal pathologic process.

Soft tissues and spinal canal: No prevertebral fluid or swelling. No
visible canal hematoma.

Upper chest: Unremarkable.

Other: None.
IMPRESSION: 1. No acute intracranial abnormality.
2. No acute displaced fracture or traumatic listhesis of the
cervical spine.
3. Multilevel degenerative change of the spine with associated
severe osseous neural foraminal stenosis of the right C5-C6 and left
C6-C7 levels.

## 2023-10-11 DIAGNOSIS — M1711 Unilateral primary osteoarthritis, right knee: Secondary | ICD-10-CM | POA: Diagnosis not present

## 2023-11-01 DIAGNOSIS — F332 Major depressive disorder, recurrent severe without psychotic features: Secondary | ICD-10-CM | POA: Diagnosis not present

## 2023-11-16 DIAGNOSIS — R1110 Vomiting, unspecified: Secondary | ICD-10-CM | POA: Diagnosis not present

## 2023-11-16 DIAGNOSIS — U071 COVID-19: Secondary | ICD-10-CM | POA: Diagnosis not present

## 2023-11-16 DIAGNOSIS — Z79899 Other long term (current) drug therapy: Secondary | ICD-10-CM | POA: Diagnosis not present

## 2023-11-16 DIAGNOSIS — N179 Acute kidney failure, unspecified: Secondary | ICD-10-CM | POA: Diagnosis not present

## 2023-11-16 DIAGNOSIS — Z87891 Personal history of nicotine dependence: Secondary | ICD-10-CM | POA: Diagnosis not present

## 2023-11-16 DIAGNOSIS — R070 Pain in throat: Secondary | ICD-10-CM | POA: Diagnosis not present

## 2023-11-16 DIAGNOSIS — R051 Acute cough: Secondary | ICD-10-CM | POA: Diagnosis not present

## 2023-11-16 DIAGNOSIS — R0981 Nasal congestion: Secondary | ICD-10-CM | POA: Diagnosis not present

## 2023-11-16 DIAGNOSIS — R509 Fever, unspecified: Secondary | ICD-10-CM | POA: Diagnosis not present

## 2023-11-17 DIAGNOSIS — U071 COVID-19: Secondary | ICD-10-CM | POA: Diagnosis not present

## 2023-11-17 DIAGNOSIS — N179 Acute kidney failure, unspecified: Secondary | ICD-10-CM | POA: Diagnosis not present

## 2023-11-24 DIAGNOSIS — R079 Chest pain, unspecified: Secondary | ICD-10-CM | POA: Diagnosis not present

## 2023-11-24 DIAGNOSIS — R531 Weakness: Secondary | ICD-10-CM | POA: Diagnosis not present

## 2023-11-24 DIAGNOSIS — U071 COVID-19: Secondary | ICD-10-CM | POA: Diagnosis not present

## 2023-11-24 DIAGNOSIS — R0602 Shortness of breath: Secondary | ICD-10-CM | POA: Diagnosis not present

## 2023-11-24 DIAGNOSIS — G4489 Other headache syndrome: Secondary | ICD-10-CM | POA: Diagnosis not present

## 2023-11-24 DIAGNOSIS — J208 Acute bronchitis due to other specified organisms: Secondary | ICD-10-CM | POA: Diagnosis not present

## 2023-11-24 DIAGNOSIS — L728 Other follicular cysts of the skin and subcutaneous tissue: Secondary | ICD-10-CM | POA: Diagnosis not present

## 2023-11-24 DIAGNOSIS — L729 Follicular cyst of the skin and subcutaneous tissue, unspecified: Secondary | ICD-10-CM | POA: Diagnosis not present

## 2023-11-24 DIAGNOSIS — J849 Interstitial pulmonary disease, unspecified: Secondary | ICD-10-CM | POA: Diagnosis not present

## 2023-11-24 DIAGNOSIS — R059 Cough, unspecified: Secondary | ICD-10-CM | POA: Diagnosis not present

## 2023-11-24 DIAGNOSIS — J441 Chronic obstructive pulmonary disease with (acute) exacerbation: Secondary | ICD-10-CM | POA: Diagnosis not present

## 2023-11-24 DIAGNOSIS — Z87891 Personal history of nicotine dependence: Secondary | ICD-10-CM | POA: Diagnosis not present

## 2023-11-24 DIAGNOSIS — R8281 Pyuria: Secondary | ICD-10-CM | POA: Diagnosis not present

## 2023-11-25 DIAGNOSIS — R531 Weakness: Secondary | ICD-10-CM | POA: Diagnosis not present

## 2023-11-26 DIAGNOSIS — K5080 Crohn's disease of both small and large intestine without complications: Secondary | ICD-10-CM | POA: Diagnosis not present

## 2023-11-26 DIAGNOSIS — E785 Hyperlipidemia, unspecified: Secondary | ICD-10-CM | POA: Diagnosis not present

## 2023-11-26 DIAGNOSIS — Z0000 Encounter for general adult medical examination without abnormal findings: Secondary | ICD-10-CM | POA: Diagnosis not present

## 2023-11-26 DIAGNOSIS — Z1331 Encounter for screening for depression: Secondary | ICD-10-CM | POA: Diagnosis not present

## 2023-11-26 DIAGNOSIS — Z1339 Encounter for screening examination for other mental health and behavioral disorders: Secondary | ICD-10-CM | POA: Diagnosis not present

## 2023-11-26 DIAGNOSIS — Z9181 History of falling: Secondary | ICD-10-CM | POA: Diagnosis not present

## 2023-11-26 DIAGNOSIS — I25119 Atherosclerotic heart disease of native coronary artery with unspecified angina pectoris: Secondary | ICD-10-CM | POA: Diagnosis not present

## 2023-11-26 DIAGNOSIS — F332 Major depressive disorder, recurrent severe without psychotic features: Secondary | ICD-10-CM | POA: Diagnosis not present

## 2023-11-26 DIAGNOSIS — J441 Chronic obstructive pulmonary disease with (acute) exacerbation: Secondary | ICD-10-CM | POA: Diagnosis not present

## 2023-12-28 DIAGNOSIS — M19011 Primary osteoarthritis, right shoulder: Secondary | ICD-10-CM | POA: Diagnosis not present

## 2023-12-28 DIAGNOSIS — F0150 Vascular dementia without behavioral disturbance: Secondary | ICD-10-CM | POA: Diagnosis not present

## 2023-12-28 DIAGNOSIS — Z96611 Presence of right artificial shoulder joint: Secondary | ICD-10-CM | POA: Diagnosis not present

## 2023-12-28 DIAGNOSIS — Z23 Encounter for immunization: Secondary | ICD-10-CM | POA: Diagnosis not present

## 2023-12-28 DIAGNOSIS — G894 Chronic pain syndrome: Secondary | ICD-10-CM | POA: Diagnosis not present

## 2023-12-28 DIAGNOSIS — M1991 Primary osteoarthritis, unspecified site: Secondary | ICD-10-CM | POA: Diagnosis not present

## 2023-12-28 DIAGNOSIS — F1120 Opioid dependence, uncomplicated: Secondary | ICD-10-CM | POA: Diagnosis not present

## 2023-12-28 DIAGNOSIS — Z6824 Body mass index (BMI) 24.0-24.9, adult: Secondary | ICD-10-CM | POA: Diagnosis not present

## 2023-12-28 DIAGNOSIS — J849 Interstitial pulmonary disease, unspecified: Secondary | ICD-10-CM | POA: Diagnosis not present

## 2024-01-12 DIAGNOSIS — Z96611 Presence of right artificial shoulder joint: Secondary | ICD-10-CM | POA: Diagnosis not present

## 2024-01-12 DIAGNOSIS — M12811 Other specific arthropathies, not elsewhere classified, right shoulder: Secondary | ICD-10-CM | POA: Diagnosis not present

## 2024-01-12 DIAGNOSIS — M75101 Unspecified rotator cuff tear or rupture of right shoulder, not specified as traumatic: Secondary | ICD-10-CM | POA: Diagnosis not present

## 2024-01-12 DIAGNOSIS — J849 Interstitial pulmonary disease, unspecified: Secondary | ICD-10-CM | POA: Diagnosis not present

## 2024-01-12 DIAGNOSIS — M5412 Radiculopathy, cervical region: Secondary | ICD-10-CM | POA: Diagnosis not present

## 2024-01-13 DIAGNOSIS — J849 Interstitial pulmonary disease, unspecified: Secondary | ICD-10-CM | POA: Diagnosis not present

## 2024-01-18 DIAGNOSIS — M4722 Other spondylosis with radiculopathy, cervical region: Secondary | ICD-10-CM | POA: Diagnosis not present

## 2024-01-21 DIAGNOSIS — M12811 Other specific arthropathies, not elsewhere classified, right shoulder: Secondary | ICD-10-CM | POA: Diagnosis not present

## 2024-01-21 DIAGNOSIS — Z96611 Presence of right artificial shoulder joint: Secondary | ICD-10-CM | POA: Diagnosis not present

## 2024-01-21 DIAGNOSIS — G5600 Carpal tunnel syndrome, unspecified upper limb: Secondary | ICD-10-CM | POA: Diagnosis not present

## 2024-01-21 DIAGNOSIS — M5412 Radiculopathy, cervical region: Secondary | ICD-10-CM | POA: Diagnosis not present

## 2024-01-21 DIAGNOSIS — M75101 Unspecified rotator cuff tear or rupture of right shoulder, not specified as traumatic: Secondary | ICD-10-CM | POA: Diagnosis not present

## 2024-02-02 DIAGNOSIS — F332 Major depressive disorder, recurrent severe without psychotic features: Secondary | ICD-10-CM | POA: Diagnosis not present

## 2024-02-14 DIAGNOSIS — G5621 Lesion of ulnar nerve, right upper limb: Secondary | ICD-10-CM | POA: Diagnosis not present

## 2024-02-14 DIAGNOSIS — G5601 Carpal tunnel syndrome, right upper limb: Secondary | ICD-10-CM | POA: Diagnosis not present

## 2024-02-22 DIAGNOSIS — G5600 Carpal tunnel syndrome, unspecified upper limb: Secondary | ICD-10-CM | POA: Diagnosis not present

## 2024-02-22 DIAGNOSIS — M5412 Radiculopathy, cervical region: Secondary | ICD-10-CM | POA: Diagnosis not present

## 2024-02-22 DIAGNOSIS — G5620 Lesion of ulnar nerve, unspecified upper limb: Secondary | ICD-10-CM | POA: Diagnosis not present

## 2024-02-28 DIAGNOSIS — G5621 Lesion of ulnar nerve, right upper limb: Secondary | ICD-10-CM | POA: Diagnosis not present

## 2024-02-28 DIAGNOSIS — G5601 Carpal tunnel syndrome, right upper limb: Secondary | ICD-10-CM | POA: Diagnosis not present
